# Patient Record
Sex: Female | Born: 1955
Health system: Southern US, Community
[De-identification: ages and names within clinical notes are randomized; demographics above are authoritative.]

## PROBLEM LIST (undated history)

## (undated) DIAGNOSIS — R002 Palpitations: Secondary | ICD-10-CM

## (undated) DIAGNOSIS — I499 Cardiac arrhythmia, unspecified: Secondary | ICD-10-CM

## (undated) DIAGNOSIS — R1310 Dysphagia, unspecified: Secondary | ICD-10-CM

## (undated) DIAGNOSIS — Z8719 Personal history of other diseases of the digestive system: Secondary | ICD-10-CM

## (undated) DIAGNOSIS — F329 Major depressive disorder, single episode, unspecified: Secondary | ICD-10-CM

## (undated) DIAGNOSIS — R079 Chest pain, unspecified: Secondary | ICD-10-CM

## (undated) DIAGNOSIS — S82899A Other fracture of unspecified lower leg, initial encounter for closed fracture: Secondary | ICD-10-CM

## (undated) DIAGNOSIS — I471 Supraventricular tachycardia, unspecified: Secondary | ICD-10-CM

## (undated) DIAGNOSIS — S42009A Fracture of unspecified part of unspecified clavicle, initial encounter for closed fracture: Secondary | ICD-10-CM

## (undated) DIAGNOSIS — R0602 Shortness of breath: Secondary | ICD-10-CM

## (undated) DIAGNOSIS — N840 Polyp of corpus uteri: Secondary | ICD-10-CM

## (undated) DIAGNOSIS — M25561 Pain in right knee: Secondary | ICD-10-CM

## (undated) DIAGNOSIS — J302 Other seasonal allergic rhinitis: Secondary | ICD-10-CM

## (undated) DIAGNOSIS — E785 Hyperlipidemia, unspecified: Secondary | ICD-10-CM

## (undated) DIAGNOSIS — M1990 Unspecified osteoarthritis, unspecified site: Secondary | ICD-10-CM

## (undated) DIAGNOSIS — K219 Gastro-esophageal reflux disease without esophagitis: Secondary | ICD-10-CM

## (undated) DIAGNOSIS — B019 Varicella without complication: Secondary | ICD-10-CM

## (undated) DIAGNOSIS — R5383 Other fatigue: Secondary | ICD-10-CM

## (undated) DIAGNOSIS — T8859XA Other complications of anesthesia, initial encounter: Secondary | ICD-10-CM

## (undated) DIAGNOSIS — F419 Anxiety disorder, unspecified: Secondary | ICD-10-CM

## (undated) DIAGNOSIS — J45909 Unspecified asthma, uncomplicated: Secondary | ICD-10-CM

## (undated) DIAGNOSIS — K589 Irritable bowel syndrome without diarrhea: Secondary | ICD-10-CM

## (undated) DIAGNOSIS — S92902A Unspecified fracture of left foot, initial encounter for closed fracture: Secondary | ICD-10-CM

## (undated) DIAGNOSIS — F32A Depression, unspecified: Secondary | ICD-10-CM

## (undated) DIAGNOSIS — M545 Low back pain, unspecified: Secondary | ICD-10-CM

## (undated) DIAGNOSIS — C801 Malignant (primary) neoplasm, unspecified: Secondary | ICD-10-CM

## (undated) DIAGNOSIS — F410 Panic disorder [episodic paroxysmal anxiety] without agoraphobia: Secondary | ICD-10-CM

## (undated) DIAGNOSIS — M25562 Pain in left knee: Secondary | ICD-10-CM

## (undated) DIAGNOSIS — M2550 Pain in unspecified joint: Secondary | ICD-10-CM

## (undated) HISTORY — DX: Panic disorder (episodic paroxysmal anxiety): F41.0

## (undated) HISTORY — DX: Hyperlipidemia, unspecified: E78.5

## (undated) HISTORY — PX: COLONOSCOPY: SHX174

## (undated) HISTORY — DX: Other seasonal allergic rhinitis: J30.2

## (undated) HISTORY — PX: ENDOMETRIAL ABLATION: SHX621

## (undated) HISTORY — PX: TUBAL LIGATION: SHX77

## (undated) HISTORY — DX: Pain in right knee: M25.562

## (undated) HISTORY — DX: Unspecified asthma, uncomplicated: J45.909

## (undated) HISTORY — DX: Fracture of unspecified part of unspecified clavicle, initial encounter for closed fracture: S42.009A

## (undated) HISTORY — PX: UPPER GASTROINTESTINAL ENDOSCOPY: SHX188

## (undated) HISTORY — PX: TONSILLECTOMY: SHX5217

## (undated) HISTORY — DX: Varicella without complication: B01.9

## (undated) HISTORY — DX: Shortness of breath: R06.02

## (undated) HISTORY — DX: Unspecified osteoarthritis, unspecified site: M19.90

## (undated) HISTORY — DX: Irritable bowel syndrome without diarrhea: K58.9

## (undated) HISTORY — PX: MANDIBLE SURGERY: SHX707

## (undated) HISTORY — DX: Low back pain, unspecified: M54.50

## (undated) HISTORY — DX: Other fracture of unspecified lower leg, initial encounter for closed fracture: S82.899A

## (undated) HISTORY — DX: Major depressive disorder, single episode, unspecified: F32.9

## (undated) HISTORY — DX: Supraventricular tachycardia, unspecified: I47.10

## (undated) HISTORY — DX: Depression, unspecified: F32.A

## (undated) HISTORY — PX: OTHER SURGICAL HISTORY: SHX169

## (undated) HISTORY — DX: Pain in unspecified joint: M25.50

## (undated) HISTORY — DX: Unspecified fracture of left foot, initial encounter for closed fracture: S92.902A

## (undated) HISTORY — DX: Other fatigue: R53.83

## (undated) HISTORY — DX: Palpitations: R00.2

## (undated) HISTORY — DX: Pain in right knee: M25.561

## (undated) HISTORY — DX: Dysphagia, unspecified: R13.10

## (undated) HISTORY — DX: Gastro-esophageal reflux disease without esophagitis: K21.9

## (undated) HISTORY — DX: Polyp of corpus uteri: N84.0

## (undated) HISTORY — DX: Chest pain, unspecified: R07.9

## (undated) HISTORY — DX: Anxiety disorder, unspecified: F41.9

---

## 2014-11-14 DIAGNOSIS — S8263XA Displaced fracture of lateral malleolus of unspecified fibula, initial encounter for closed fracture: Secondary | ICD-10-CM | POA: Insufficient documentation

## 2014-11-14 DIAGNOSIS — S92253A Displaced fracture of navicular [scaphoid] of unspecified foot, initial encounter for closed fracture: Secondary | ICD-10-CM | POA: Insufficient documentation

## 2015-01-04 DIAGNOSIS — M5136 Other intervertebral disc degeneration, lumbar region: Secondary | ICD-10-CM | POA: Insufficient documentation

## 2015-01-04 DIAGNOSIS — M4317 Spondylolisthesis, lumbosacral region: Secondary | ICD-10-CM | POA: Insufficient documentation

## 2015-06-25 ENCOUNTER — Encounter: Payer: Self-pay | Admitting: Family Medicine

## 2015-06-25 ENCOUNTER — Ambulatory Visit (INDEPENDENT_AMBULATORY_CARE_PROVIDER_SITE_OTHER): Payer: Federal, State, Local not specified - PPO | Admitting: Family Medicine

## 2015-06-25 VITALS — BP 126/80 | HR 84 | Temp 98.1°F | Ht 61.25 in | Wt 243.0 lb

## 2015-06-25 DIAGNOSIS — Z0001 Encounter for general adult medical examination with abnormal findings: Secondary | ICD-10-CM | POA: Diagnosis not present

## 2015-06-25 DIAGNOSIS — M170 Bilateral primary osteoarthritis of knee: Secondary | ICD-10-CM

## 2015-06-25 DIAGNOSIS — K219 Gastro-esophageal reflux disease without esophagitis: Secondary | ICD-10-CM

## 2015-06-25 DIAGNOSIS — E785 Hyperlipidemia, unspecified: Secondary | ICD-10-CM | POA: Diagnosis not present

## 2015-06-25 DIAGNOSIS — J0140 Acute pansinusitis, unspecified: Secondary | ICD-10-CM

## 2015-06-25 DIAGNOSIS — J209 Acute bronchitis, unspecified: Secondary | ICD-10-CM | POA: Diagnosis not present

## 2015-06-25 DIAGNOSIS — M25562 Pain in left knee: Secondary | ICD-10-CM | POA: Insufficient documentation

## 2015-06-25 DIAGNOSIS — S83412A Sprain of medial collateral ligament of left knee, initial encounter: Secondary | ICD-10-CM | POA: Insufficient documentation

## 2015-06-25 DIAGNOSIS — Z114 Encounter for screening for human immunodeficiency virus [HIV]: Secondary | ICD-10-CM

## 2015-06-25 DIAGNOSIS — R319 Hematuria, unspecified: Secondary | ICD-10-CM

## 2015-06-25 DIAGNOSIS — F419 Anxiety disorder, unspecified: Secondary | ICD-10-CM | POA: Insufficient documentation

## 2015-06-25 DIAGNOSIS — F411 Generalized anxiety disorder: Secondary | ICD-10-CM

## 2015-06-25 DIAGNOSIS — Z1231 Encounter for screening mammogram for malignant neoplasm of breast: Secondary | ICD-10-CM

## 2015-06-25 DIAGNOSIS — Z1159 Encounter for screening for other viral diseases: Secondary | ICD-10-CM

## 2015-06-25 LAB — POCT URINALYSIS DIPSTICK
BILIRUBIN UA: NEGATIVE
GLUCOSE UA: NEGATIVE
KETONES UA: NEGATIVE
Leukocytes, UA: NEGATIVE
NITRITE UA: NEGATIVE
Protein, UA: NEGATIVE
Spec Grav, UA: >=1.03
Urobilinogen, UA: 0.2
pH, UA: 6

## 2015-06-25 MED ORDER — ALBUTEROL SULFATE HFA 108 (90 BASE) MCG/ACT IN AERS: INHALATION_SPRAY | RESPIRATORY_TRACT | Status: DC

## 2015-06-25 MED ORDER — FLUTICASONE PROPIONATE 50 MCG/ACT NA SUSP: 2.0000 | Freq: Every day | NASAL | Status: DC

## 2015-06-25 MED ORDER — ALPRAZOLAM 0.25 MG PO TABS: 0.2500 mg | ORAL_TABLET | Freq: Every evening | ORAL | Status: DC | PRN

## 2015-06-25 MED ORDER — AMOXICILLIN-POT CLAVULANATE 875-125 MG PO TABS: 1.0000 | ORAL_TABLET | Freq: Two times a day (BID) | ORAL | Status: DC

## 2015-06-25 MED ORDER — PHENTERMINE HCL 37.5 MG PO TABS: 37.5000 mg | ORAL_TABLET | Freq: Every day | ORAL | Status: DC

## 2015-06-25 NOTE — Progress Notes (Signed)
Pre visit review using our clinic review tool, if applicable. No additional management support is needed unless otherwise documented below in the visit note. 

## 2015-06-25 NOTE — Progress Notes (Addendum)
Subjective:     Tina Hinton is a 60 y.o. female and is here for a comprehensive physical exam. The patient reports problems - cough,-- productive x 2 weeks , sinus headache.  Social History   Social History  . Marital Status: Divorced    Spouse Name: N/A  . Number of Children: N/A  . Years of Education: N/A   Occupational History  . Not on file.   Social History Main Topics  . Smoking status: Never Smoker   . Smokeless tobacco: Never Used  . Alcohol Use: No  . Drug Use: No  . Sexual Activity: Not on file   Other Topics Concern  . Not on file   Social History Narrative  . No narrative on file   Health Maintenance  Topic Date Due  . TETANUS/TDAP  06/21/1974  . PAP SMEAR  06/21/1976  . MAMMOGRAM  06/21/2005  . COLONOSCOPY  06/21/2005  . ZOSTAVAX  06/22/2015  . INFLUENZA VACCINE  01/08/2016  . Hepatitis C Screening  Completed  . HIV Screening  Completed    The following portions of the patient's history were reviewed and updated as appropriate:  She  has a past medical history of Asthma; Arthritis; Chicken pox; Depression; GERD (gastroesophageal reflux disease); Hyperlipidemia; Clavicle fracture; Ankle fracture; and Foot fracture, left. She  does not have any pertinent problems on file. She  has past surgical history that includes Tonsillectomy; Mandible surgery; Cesarean section; Tubal ligation; and Endometrial ablation. Her family history includes Arthritis in her maternal grandmother and mother; Breast cancer in her maternal aunt and paternal grandmother; Breast cancer (age of onset: 89) in her mother; Diabetes in her maternal grandmother; Heart disease in her maternal grandmother; Prostate cancer in her paternal grandfather. She  reports that she has never smoked. She has never used smokeless tobacco. She reports that she does not drink alcohol or use illicit drugs. She has a current medication list which includes the following prescription(s): albuterol,  alprazolam, amoxicillin-clavulanate, diclofenac, fluticasone, lansoprazole, methocarbamol, phentermine, and rosuvastatin. No current outpatient prescriptions on file prior to visit.   No current facility-administered medications on file prior to visit.   She is allergic to phenylephrine-guaifenesin..  Review of Systems Review of Systems  Constitutional: Negative for activity change, appetite change and fatigue.  HENT: Negative for hearing loss, congestion, tinnitus and ear discharge.  dentist q35m + SINUS HEADACHE Eyes: Negative for visual disturbance (see optho q1y -- vision corrected to 20/20 with glasses).  Respiratory: Negative for , chest tightness and shortness of breath.  + cough Cardiovascular: Negative for chest pain, palpitations and leg swelling.  Gastrointestinal: Negative for abdominal pain, diarrhea, constipation and abdominal distention.  Genitourinary: Negative for urgency, frequency, decreased urine volume and difficulty urinating.  Musculoskeletal: Negative for back pain, arthralgias and gait problem.  Skin: Negative for color change, pallor and rash.  Neurological: Negative for dizziness, light-headedness, numbness  Hematological: Negative for adenopathy. Does not bruise/bleed easily.  Psychiatric/Behavioral: Negative for suicidal ideas, confusion, sleep disturbance, self-injury, dysphoric mood, decreased concentration and agitation.       Objective:    BP 126/80 mmHg  Pulse 84  Temp(Src) 98.1 F (36.7 C) (Oral)  Ht 5' 1.25" (1.556 m)  Wt 243 lb (110.224 kg)  BMI 45.53 kg/m2  SpO2 98% General appearance: alert, cooperative, appears stated age and no distress Head: Normocephalic, without obvious abnormality, atraumatic Eyes: conjunctivae/corneas clear. PERRL, EOM's intact. Fundi benign. Ears: normal TM's and external ear canals both ears Nose: green discharge, moderate  congestion, turbinates red, swollen, sinus tenderness bilateral Throat: lips, mucosa, and  tongue normal; teeth and gums normal Neck: mild anterior cervical adenopathy, supple, symmetrical, trachea midline and thyroid not enlarged, symmetric, no tenderness/mass/nodules Back: symmetric, no curvature. ROM normal. No CVA tenderness. Lungs: clear to auscultation bilaterally Breasts: normal appearance, no masses or tenderness Heart: regular rate and rhythm, S1, S2 normal, no murmur, click, rub or gallop Abdomen: soft, non-tender; bowel sounds normal; no masses,  no organomegaly Pelvic: not indicated; post-menopausal, no abnormal Pap smears in past Extremities: extremities normal, atraumatic, no cyanosis or edema Pulses: 2+ and symmetric Skin: Skin color, texture, turgor normal. No rashes or lesions Lymph nodes: Cervical, supraclavicular, and axillary nodes normal. Neurologic: Alert and oriented X 3, normal strength and tone. Normal symmetric reflexes. Normal coordination and gait Psych-- no depression, no anxiety      Assessment:    Healthy female exam.     Plan:     See After Visit Summary for Counseling Recommendations   Check labs con't med  1. Visit for screening mammogram   - MM DIGITAL SCREENING BILATERAL; Future  2. Osteoarthritis of both knees, unspecified osteoarthritis type    3. Hyperlipidemia LDL goal <100   Current outpatient prescriptions:  .  albuterol (PROVENTIL HFA;VENTOLIN HFA) 108 (90 Base) MCG/ACT inhaler, 2 puffs qid prn, Disp: 1 Inhaler, Rfl: 2 .  ALPRAZolam (XANAX) 0.25 MG tablet, Take 1 tablet (0.25 mg total) by mouth at bedtime as needed., Disp: 30 tablet, Rfl: 1 .  amoxicillin-clavulanate (AUGMENTIN) 875-125 MG tablet, Take 1 tablet by mouth 2 (two) times daily., Disp: 20 tablet, Rfl: 0 .  diclofenac (VOLTAREN) 75 MG EC tablet, Take 75 mg by mouth 2 (two) times daily., Disp: , Rfl:  .  fluticasone (FLONASE) 50 MCG/ACT nasal spray, Place 2 sprays into both nostrils daily., Disp: 16 g, Rfl: 6 .  lansoprazole (PREVACID) 30 MG capsule, Take 30  mg by mouth daily at 12 noon., Disp: , Rfl:  .  methocarbamol (ROBAXIN) 750 MG tablet, Take 750 mg by mouth 2 (two) times daily as needed for muscle spasms., Disp: , Rfl:  .  phentermine (ADIPEX-P) 37.5 MG tablet, Take 1 tablet (37.5 mg total) by mouth daily before breakfast., Disp: 30 tablet, Rfl: 0 .  rosuvastatin (CRESTOR) 20 MG tablet, Take 20 mg by mouth at bedtime., Disp: , Rfl:  - Comp Met (CMET) - CBC with Differential/Platelet - Lipid panel - POCT urinalysis dipstick - TSH  4. Morbid obesity, unspecified obesity type ([place])   - EKG 12-Lead - phentermine (ADIPEX-P) 37.5 MG tablet; Take 1 tablet (37.5 mg total) by mouth daily before breakfast.  Dispense: 30 tablet; Refill: 0  5. Generalized anxiety disorder   - ALPRAZolam (XANAX) 0.25 MG tablet; Take 1 tablet (0.25 mg total) by mouth at bedtime as needed.  Dispense: 30 tablet; Refill: 1  6. Acute bronchitis, unspecified organism  rto if symptoms worsen or do not improve - amoxicillin-clavulanate (AUGMENTIN) 875-125 MG tablet; Take 1 tablet by mouth 2 (two) times daily.  Dispense: 20 tablet; Refill: 0 - albuterol (PROVENTIL HFA;VENTOLIN HFA) 108 (90 Base) MCG/ACT inhaler; 2 puffs qid prn  Dispense: 1 Inhaler; Refill: 2  7. Acute pansinusitis, recurrence not specified abx per orders  - fluticasone (FLONASE) 50 MCG/ACT nasal spray; Place 2 sprays into both nostrils daily.  Dispense: 16 g; Refill: 6  8. Need for hepatitis C screening test   - Hepatitis C antibody  9. Encounter for screening for HIV   -  HIV antibody  10. Hematuria   - Urine culture

## 2015-06-25 NOTE — Patient Instructions (Signed)

## 2015-06-26 LAB — CBC WITH DIFFERENTIAL/PLATELET
BASOS ABS: 0.1 10*3/uL (ref 0.0–0.1)
Basophils Relative: 0.8 % (ref 0.0–3.0)
EOS ABS: 0.2 10*3/uL (ref 0.0–0.7)
Eosinophils Relative: 2.1 % (ref 0.0–5.0)
HCT: 39.4 % (ref 36.0–46.0)
Hemoglobin: 12.7 g/dL (ref 12.0–15.0)
LYMPHS ABS: 2.1 10*3/uL (ref 0.7–4.0)
Lymphocytes Relative: 26 % (ref 12.0–46.0)
MCHC: 32.3 g/dL (ref 30.0–36.0)
MCV: 88.8 fl (ref 78.0–100.0)
Monocytes Absolute: 0.8 10*3/uL (ref 0.1–1.0)
Monocytes Relative: 9.2 % (ref 3.0–12.0)
NEUTROS ABS: 5.1 10*3/uL (ref 1.4–7.7)
NEUTROS PCT: 61.9 % (ref 43.0–77.0)
PLATELETS: 272 10*3/uL (ref 150.0–400.0)
RBC: 4.44 Mil/uL (ref 3.87–5.11)
RDW: 14 % (ref 11.5–15.5)
WBC: 8.2 10*3/uL (ref 4.0–10.5)

## 2015-06-26 LAB — COMPREHENSIVE METABOLIC PANEL
ALK PHOS: 87 U/L (ref 39–117)
ALT: 17 U/L (ref 0–35)
AST: 21 U/L (ref 0–37)
Albumin: 3.9 g/dL (ref 3.5–5.2)
BILIRUBIN TOTAL: 0.2 mg/dL (ref 0.2–1.2)
BUN: 17 mg/dL (ref 6–23)
CALCIUM: 8.8 mg/dL (ref 8.4–10.5)
CO2: 32 meq/L (ref 19–32)
CREATININE: 0.76 mg/dL (ref 0.40–1.20)
Chloride: 102 mEq/L (ref 96–112)
GFR: 82.5 mL/min (ref >60.00)
GLUCOSE: 84 mg/dL (ref 70–99)
Potassium: 4.1 mEq/L (ref 3.5–5.1)
Sodium: 141 mEq/L (ref 135–145)
TOTAL PROTEIN: 7.2 g/dL (ref 6.0–8.3)

## 2015-06-26 LAB — HIV ANTIBODY (ROUTINE TESTING W REFLEX): HIV 1&2 Ab, 4th Generation: NONREACTIVE

## 2015-06-26 LAB — LIPID PANEL
CHOLESTEROL: 232 mg/dL — AB (ref 0–200)
HDL: 38.5 mg/dL — AB (ref >39.00)
NonHDL: 193.47
TRIGLYCERIDES: 216 mg/dL — AB (ref 0.0–149.0)
Total CHOL/HDL Ratio: 6
VLDL: 43.2 mg/dL — ABNORMAL HIGH (ref 0.0–40.0)

## 2015-06-26 LAB — HEPATITIS C ANTIBODY: HCV Ab: NEGATIVE

## 2015-06-26 LAB — TSH: TSH: 1.1 u[IU]/mL (ref 0.35–4.50)

## 2015-06-26 LAB — LDL CHOLESTEROL, DIRECT: LDL DIRECT: 155 mg/dL

## 2015-06-27 LAB — URINE CULTURE

## 2015-07-02 ENCOUNTER — Ambulatory Visit (HOSPITAL_BASED_OUTPATIENT_CLINIC_OR_DEPARTMENT_OTHER)
Admission: RE | Admit: 2015-07-02 | Discharge: 2015-07-02 | Disposition: A | Discharge status: Home / Self Care | Payer: Federal, State, Local not specified - PPO | Source: Ambulatory Visit | Attending: Family Medicine | Admitting: Family Medicine

## 2015-07-02 DIAGNOSIS — Z1231 Encounter for screening mammogram for malignant neoplasm of breast: Secondary | ICD-10-CM

## 2015-07-04 ENCOUNTER — Other Ambulatory Visit: Payer: Self-pay

## 2015-07-04 DIAGNOSIS — E785 Hyperlipidemia, unspecified: Secondary | ICD-10-CM

## 2015-07-04 MED ORDER — ROSUVASTATIN CALCIUM 20 MG PO TABS: 20.0000 mg | ORAL_TABLET | Freq: Every day | ORAL | Status: DC

## 2015-07-04 MED ORDER — FENOFIBRATE 160 MG PO TABS: 160.0000 mg | ORAL_TABLET | Freq: Every day | ORAL | Status: DC

## 2015-07-30 ENCOUNTER — Ambulatory Visit (INDEPENDENT_AMBULATORY_CARE_PROVIDER_SITE_OTHER): Payer: Federal, State, Local not specified - PPO | Admitting: Family Medicine

## 2015-07-30 ENCOUNTER — Encounter: Payer: Self-pay | Admitting: Family Medicine

## 2015-07-30 VITALS — BP 114/80 | HR 90 | Temp 97.8°F | Ht 61.0 in | Wt 230.0 lb

## 2015-07-30 DIAGNOSIS — Z0000 Encounter for general adult medical examination without abnormal findings: Secondary | ICD-10-CM

## 2015-07-30 DIAGNOSIS — M170 Bilateral primary osteoarthritis of knee: Secondary | ICD-10-CM

## 2015-07-30 DIAGNOSIS — E669 Obesity, unspecified: Secondary | ICD-10-CM | POA: Insufficient documentation

## 2015-07-30 DIAGNOSIS — Z23 Encounter for immunization: Secondary | ICD-10-CM

## 2015-07-30 MED ORDER — METHOCARBAMOL 750 MG PO TABS: 750.0000 mg | ORAL_TABLET | Freq: Two times a day (BID) | ORAL | Status: DC | PRN

## 2015-07-30 MED ORDER — DICLOFENAC SODIUM 75 MG PO TBEC: 75.0000 mg | DELAYED_RELEASE_TABLET | Freq: Two times a day (BID) | ORAL | Status: DC

## 2015-07-30 MED ORDER — PHENTERMINE HCL 37.5 MG PO TABS: 37.5000 mg | ORAL_TABLET | Freq: Every day | ORAL | Status: DC

## 2015-07-30 NOTE — Progress Notes (Signed)
Pre visit review using our clinic review tool, if applicable. No additional management support is needed unless otherwise documented below in the visit note. 

## 2015-07-30 NOTE — Patient Instructions (Signed)

## 2015-07-30 NOTE — Assessment & Plan Note (Signed)
con't with diet Pt to start exercising Refill phenteramine  rto 1 month

## 2015-07-30 NOTE — Assessment & Plan Note (Signed)
Pt due for colonoscopy zostavax given

## 2015-07-30 NOTE — Progress Notes (Signed)
Patient ID: Tina Hinton, female    DOB: 05-26-1956  Age: 60 y.o. MRN: [id number]    Subjective:  Subjective HPI Tina Hinton presents for weight check.  She has been cutting down on carbs and eating more fruit and veg.  She has lost 13 lbs and is happy with that.  Pt is not exercising yet but plans to start.   No more wheezing.    Review of Systems  Constitutional: Negative for diaphoresis, appetite change, fatigue and unexpected weight change.  Eyes: Negative for pain, redness and visual disturbance.  Respiratory: Negative for cough, chest tightness, shortness of breath and wheezing.   Cardiovascular: Negative for chest pain, palpitations and leg swelling.  Endocrine: Negative for cold intolerance, heat intolerance, polydipsia, polyphagia and polyuria.  Genitourinary: Negative for dysuria, frequency and difficulty urinating.  Neurological: Negative for dizziness, light-headedness, numbness and headaches.  Psychiatric/Behavioral: Negative for decreased concentration. The patient is not nervous/anxious.     History Past Medical History  Diagnosis Date  . Asthma   . Arthritis   . Chicken pox   . Depression   . GERD (gastroesophageal reflux disease)   . Hyperlipidemia   . Clavicle fracture     Left  . Ankle fracture     Right  . Foot fracture, left     She has past surgical history that includes Tonsillectomy; Mandible surgery; Cesarean section; Tubal ligation; and Endometrial ablation.   Her family history includes Arthritis in her maternal grandmother and mother; Breast cancer in her maternal aunt and paternal grandmother; Breast cancer (age of onset: 2) in her mother; Diabetes in her maternal grandmother; Heart disease in her maternal grandmother; Prostate cancer in her paternal grandfather.She reports that she has never smoked. She has never used smokeless tobacco. She reports that she does not drink alcohol or use illicit drugs.  Current Outpatient  Prescriptions on File Prior to Visit  Medication Sig Dispense Refill  . albuterol (PROVENTIL HFA;VENTOLIN HFA) 108 (90 Base) MCG/ACT inhaler 2 puffs qid prn 1 Inhaler 2  . ALPRAZolam (XANAX) 0.25 MG tablet Take 1 tablet (0.25 mg total) by mouth at bedtime as needed. 30 tablet 1  . fenofibrate 160 MG tablet Take 1 tablet (160 mg total) by mouth daily. 30 tablet 5  . lansoprazole (PREVACID) 30 MG capsule Take 30 mg by mouth daily at 12 noon.    . rosuvastatin (CRESTOR) 20 MG tablet Take 1 tablet (20 mg total) by mouth at bedtime. 30 tablet 5   No current facility-administered medications on file prior to visit.     Objective:  Objective Physical Exam  Constitutional: She is oriented to person, place, and time. She appears well-developed and well-nourished.  HENT:  Head: Normocephalic and atraumatic.  Eyes: Conjunctivae and EOM are normal.  Neck: Normal range of motion. Neck supple. No JVD present. Carotid bruit is not present. No thyromegaly present.  Cardiovascular: Normal rate, regular rhythm and normal heart sounds.   No murmur heard. Pulmonary/Chest: Effort normal and breath sounds normal. No respiratory distress. She has no wheezes. She has no rales. She exhibits no tenderness.  Musculoskeletal: She exhibits no edema.  Neurological: She is alert and oriented to person, place, and time.  Psychiatric: She has a normal mood and affect. Her behavior is normal. Thought content normal.  Nursing note and vitals reviewed.  BP 114/80 mmHg  Pulse 90  Temp(Src) 97.8 F (36.6 C) (Oral)  Ht 5\' 1"  (1.549 m)  Wt 230 lb (104.327 kg)  BMI 43.48  kg/m2  SpO2 97% Wt Readings from Last 3 Encounters:  07/30/15 230 lb (104.327 kg)  06/25/15 243 lb (110.224 kg)     Lab Results  Component Value Date   WBC 8.2 06/25/2015   HGB 12.7 06/25/2015   HCT 39.4 06/25/2015   PLT 272.0 06/25/2015   GLUCOSE 84 06/25/2015   CHOL 232* 06/25/2015   TRIG 216.0* 06/25/2015   HDL 38.50* 06/25/2015    LDLDIRECT 155.0 06/25/2015   ALT 17 06/25/2015   AST 21 06/25/2015   NA 141 06/25/2015   K 4.1 06/25/2015   CL 102 06/25/2015   CREATININE 0.76 06/25/2015   BUN 17 06/25/2015   CO2 32 06/25/2015   TSH 1.10 06/25/2015    Mm Digital Screening Bilateral  07/03/2015  CLINICAL DATA:  Screening. EXAM: DIGITAL SCREENING BILATERAL MAMMOGRAM WITH CAD COMPARISON:  None. ACR Breast Density Category b: There are scattered areas of fibroglandular density. FINDINGS: There are no findings suspicious for malignancy. Images were processed with CAD. IMPRESSION: No mammographic evidence of malignancy. A result letter of this screening mammogram will be mailed directly to the patient. RECOMMENDATION: Screening mammogram in one year. (Code:SM-B-01Y) BI-RADS CATEGORY  1: Negative. Electronically Signed   By: Curlene Dolphin M.D.   On: 07/03/2015 09:27     Assessment & Plan:  Plan I have discontinued Ms. Mousel's amoxicillin-clavulanate and fluticasone. I have also changed her diclofenac and methocarbamol. Additionally, I am having her maintain her lansoprazole, ALPRAZolam, albuterol, fenofibrate, rosuvastatin, and phentermine.  Meds ordered this encounter  Medications  . diclofenac (VOLTAREN) 75 MG EC tablet    Sig: Take 1 tablet (75 mg total) by mouth 2 (two) times daily.    Dispense:  60 tablet    Refill:  2  . methocarbamol (ROBAXIN) 750 MG tablet    Sig: Take 1 tablet (750 mg total) by mouth 2 (two) times daily as needed for muscle spasms.    Dispense:  60 tablet    Refill:  0  . phentermine (ADIPEX-P) 37.5 MG tablet    Sig: Take 1 tablet (37.5 mg total) by mouth daily before breakfast.    Dispense:  30 tablet    Refill:  0    Problem List Items Addressed This Visit      Unprioritized   Preventative health care - Primary   Relevant Orders   Ambulatory referral to Gastroenterology   Osteoarthritis of both knees   Relevant Medications   diclofenac (VOLTAREN) 75 MG EC tablet    methocarbamol (ROBAXIN) 750 MG tablet    Other Visit Diagnoses    Morbid obesity, unspecified obesity type ([place])        Relevant Medications    phentermine (ADIPEX-P) 37.5 MG tablet    Need for shingles vaccine        Relevant Orders    Varicella-zoster vaccine subcutaneous (Completed)       Follow-up: Return in about 4 weeks (around 08/27/2015), or if symptoms worsen or fail to improve, for weight check, annual exam, fasting.  Garnet Koyanagi, DO

## 2015-07-31 ENCOUNTER — Encounter: Payer: Self-pay | Admitting: Internal Medicine

## 2015-08-27 ENCOUNTER — Encounter: Payer: Self-pay | Admitting: Family Medicine

## 2015-08-27 ENCOUNTER — Ambulatory Visit (INDEPENDENT_AMBULATORY_CARE_PROVIDER_SITE_OTHER): Payer: Federal, State, Local not specified - PPO | Admitting: Family Medicine

## 2015-08-27 DIAGNOSIS — L309 Dermatitis, unspecified: Secondary | ICD-10-CM | POA: Diagnosis not present

## 2015-08-27 DIAGNOSIS — D229 Melanocytic nevi, unspecified: Secondary | ICD-10-CM

## 2015-08-27 MED ORDER — PHENTERMINE HCL 37.5 MG PO TABS: 37.5000 mg | ORAL_TABLET | Freq: Every day | ORAL | Status: DC

## 2015-08-27 MED ORDER — TRIAMCINOLONE 0.1 % CREAM:EUCERIN CREAM 1:1: 1.0000 "application " | TOPICAL_CREAM | Freq: Three times a day (TID) | CUTANEOUS | Status: DC | PRN

## 2015-08-27 NOTE — Progress Notes (Signed)
Patient ID: Tina Hinton, female    DOB: [birth year]  Age: 60 y.o. MRN: [id number]    Subjective:  Subjective HPI Tina Hinton presents for weight check.  Pt knows she did not do well this month.  She is requesting another try.    Review of Systems  Constitutional: Negative for diaphoresis, appetite change, fatigue and unexpected weight change.  Eyes: Negative for pain, redness and visual disturbance.  Respiratory: Negative for cough, chest tightness, shortness of breath and wheezing.   Cardiovascular: Negative for chest pain, palpitations and leg swelling.  Endocrine: Negative for cold intolerance, heat intolerance, polydipsia, polyphagia and polyuria.  Genitourinary: Negative for dysuria, frequency and difficulty urinating.  Neurological: Negative for dizziness, light-headedness, numbness and headaches.    History Past Medical History  Diagnosis Date  . Asthma   . Arthritis   . Chicken pox   . Depression   . GERD (gastroesophageal reflux disease)   . Hyperlipidemia   . Clavicle fracture     Left  . Ankle fracture     Right  . Foot fracture, left     She has past surgical history that includes Tonsillectomy; Mandible surgery; Cesarean section; Tubal ligation; and Endometrial ablation.   Her family history includes Arthritis in her maternal grandmother and mother; Breast cancer in her maternal aunt and paternal grandmother; Breast cancer (age of onset: 73) in her mother; Diabetes in her maternal grandmother; Heart disease in her maternal grandmother; Prostate cancer in her paternal grandfather.She reports that she has never smoked. She has never used smokeless tobacco. She reports that she does not drink alcohol or use illicit drugs.  Current Outpatient Prescriptions on File Prior to Visit  Medication Sig Dispense Refill  . albuterol (PROVENTIL HFA;VENTOLIN HFA) 108 (90 Base) MCG/ACT inhaler 2 puffs qid prn 1 Inhaler 2  . ALPRAZolam (XANAX) 0.25 MG tablet Take 1  tablet (0.25 mg total) by mouth at bedtime as needed. 30 tablet 1  . diclofenac (VOLTAREN) 75 MG EC tablet Take 1 tablet (75 mg total) by mouth 2 (two) times daily. 60 tablet 2  . fenofibrate 160 MG tablet Take 1 tablet (160 mg total) by mouth daily. 30 tablet 5  . lansoprazole (PREVACID) 30 MG capsule Take 30 mg by mouth daily at 12 noon.    . methocarbamol (ROBAXIN) 750 MG tablet Take 1 tablet (750 mg total) by mouth 2 (two) times daily as needed for muscle spasms. 60 tablet 0  . rosuvastatin (CRESTOR) 20 MG tablet Take 1 tablet (20 mg total) by mouth at bedtime. 30 tablet 5   No current facility-administered medications on file prior to visit.     Objective:  Objective Physical Exam  Constitutional: She is oriented to person, place, and time. She appears well-developed and well-nourished.  HENT:  Head: Normocephalic and atraumatic.  Eyes: Conjunctivae and EOM are normal.  Neck: Normal range of motion. Neck supple. No JVD present. Carotid bruit is not present. No thyromegaly present.  Cardiovascular: Normal rate, regular rhythm and normal heart sounds.   No murmur heard. Pulmonary/Chest: Effort normal and breath sounds normal. No respiratory distress. She has no wheezes. She has no rales. She exhibits no tenderness.  Musculoskeletal: She exhibits no edema.  Neurological: She is alert and oriented to person, place, and time.  Psychiatric: She has a normal mood and affect. Her behavior is normal. Judgment and thought content normal.  Nursing note and vitals reviewed.  BP 120/84 mmHg  Pulse 85  Temp(Src) 97.7 F (36.5 C) (  Oral)  Ht 5\' 1"  (1.549 m)  Wt 228 lb 12.8 oz (103.783 kg)  BMI 43.25 kg/m2  SpO2 97% Wt Readings from Last 3 Encounters:  08/27/15 228 lb 12.8 oz (103.783 kg)  07/30/15 230 lb (104.327 kg)  06/25/15 243 lb (110.224 kg)     Lab Results  Component Value Date   WBC 8.2 06/25/2015   HGB 12.7 06/25/2015   HCT 39.4 06/25/2015   PLT 272.0 06/25/2015   GLUCOSE  84 06/25/2015   CHOL 232* 06/25/2015   TRIG 216.0* 06/25/2015   HDL 38.50* 06/25/2015   LDLDIRECT 155.0 06/25/2015   ALT 17 06/25/2015   AST 21 06/25/2015   NA 141 06/25/2015   K 4.1 06/25/2015   CL 102 06/25/2015   CREATININE 0.76 06/25/2015   BUN 17 06/25/2015   CO2 32 06/25/2015   TSH 1.10 06/25/2015    Mm Digital Screening Bilateral  07/03/2015  CLINICAL DATA:  Screening. EXAM: DIGITAL SCREENING BILATERAL MAMMOGRAM WITH CAD COMPARISON:  None. ACR Breast Density Category b: There are scattered areas of fibroglandular density. FINDINGS: There are no findings suspicious for malignancy. Images were processed with CAD. IMPRESSION: No mammographic evidence of malignancy. A result letter of this screening mammogram will be mailed directly to the patient. RECOMMENDATION: Screening mammogram in one year. (Code:SM-B-01Y) BI-RADS CATEGORY  1: Negative. Electronically Signed   By: Curlene Dolphin M.D.   On: 07/03/2015 09:27     Assessment & Plan:  Plan I am having [name] start on triamcinolone 0.1 % cream : eucerin. I am also having her maintain her lansoprazole, ALPRAZolam, albuterol, fenofibrate, rosuvastatin, diclofenac, methocarbamol, and phentermine.  Meds ordered this encounter  Medications  . phentermine (ADIPEX-P) 37.5 MG tablet    Sig: Take 1 tablet (37.5 mg total) by mouth daily before breakfast.    Dispense:  30 tablet    Refill:  0  . Triamcinolone Acetonide (TRIAMCINOLONE 0.1 % CREAM : EUCERIN) CREA    Sig: Apply 1 application topically 3 (three) times daily as needed for rash or itching.    Dispense:  1 each    Refill:  3    Problem List Items Addressed This Visit      Unprioritized   Morbid obesity ([place]) - Primary   Relevant Medications   phentermine (ADIPEX-P) 37.5 MG tablet    Other Visit Diagnoses    Suspicious nevus        Relevant Orders    Ambulatory referral to Dermatology    Eczema        Relevant Medications    Triamcinolone Acetonide  (TRIAMCINOLONE 0.1 % CREAM : EUCERIN) CREA       Follow-up: Return in about 4 weeks (around 09/24/2015), or if symptoms worsen or fail to improve, for weight check.  Garnet Koyanagi, DO

## 2015-08-27 NOTE — Patient Instructions (Signed)
Obesity Obesity is defined as having too much total body fat and a body mass index (BMI) of 30 or more. BMI is an estimate of body fat and is calculated from your height and weight. BMI is typically calculated by your health care provider during regular wellness visits. Obesity happens when you consume more calories than you can burn by exercising or performing daily physical tasks. Prolonged obesity can cause major illnesses or emergencies, such as:  Stroke.  Heart disease.  Diabetes.  Cancer.  Arthritis.  High blood pressure (hypertension).  High cholesterol.  Sleep apnea.  Erectile dysfunction.  Infertility problems. CAUSES   Regularly eating unhealthy foods.  Physical inactivity.  Certain disorders, such as an underactive thyroid (hypothyroidism), Cushing's syndrome, and polycystic ovarian syndrome.  Certain medicines, such as steroids, some depression medicines, and antipsychotics.  Genetics.  Lack of sleep. DIAGNOSIS A health care provider can diagnose obesity after calculating your BMI. Obesity will be diagnosed if your BMI is 30 or higher. There are other methods of measuring obesity levels. Some other methods include measuring your skinfold thickness, your waist circumference, and comparing your hip circumference to your waist circumference. TREATMENT  A healthy treatment program includes some or all of the following:  Long-term dietary changes.  Exercise and physical activity.  Behavioral and lifestyle changes.  Medicine only under the supervision of your health care provider. Medicines may help, but only if they are used with diet and exercise programs. If your BMI is 40 or higher, your health care provider may recommend specialized surgery or programs to help with weight loss. An unhealthy treatment program includes:  Fasting.  Fad diets.  Supplements and drugs. These choices do not succeed in long-term weight control. HOME CARE  INSTRUCTIONS  Exercise and perform physical activity as directed by your health care provider. To increase physical activity, try the following:  Use stairs instead of elevators.  Park farther away from store entrances.  Garden, bike, or walk instead of watching television or using the computer.  Eat healthy, low-calorie foods and drinks on a regular basis. Eat more fruits and vegetables. Use low-calorie cookbooks or take healthy cooking classes.  Limit fast food, sweets, and processed snack foods.  Eat smaller portions.  Keep a daily journal of everything you eat. There are many free websites to help you with this. It may be helpful to measure your foods so you can determine if you are eating the correct portion sizes.  Avoid drinking alcohol. Drink more water and drinks without calories.  Take vitamins and supplements only as recommended by your health care provider.  Weight-loss support groups, registered dietitians, counselors, and stress reduction education can also be very helpful. SEEK IMMEDIATE MEDICAL CARE IF:  You have chest pain or tightness.  You have trouble breathing or feel short of breath.  You have weakness or leg numbness.  You feel confused or have trouble talking.  You have sudden changes in your vision.   This information is not intended to replace advice given to you by your health care provider. Make sure you discuss any questions you have with your health care provider.   Document Released: 07/03/2004 Document Revised: 06/16/2014 Document Reviewed: 07/02/2011 Elsevier Interactive Patient Education 2016 [employer]  

## 2015-08-27 NOTE — Progress Notes (Signed)
Pre visit review using our clinic review tool, if applicable. No additional management support is needed unless otherwise documented below in the visit note. 

## 2015-08-29 NOTE — Assessment & Plan Note (Signed)
Pt will work harder on diet and exercise rto 1 month

## 2015-08-31 ENCOUNTER — Ambulatory Visit (AMBULATORY_SURGERY_CENTER): Payer: Self-pay | Admitting: *Deleted

## 2015-08-31 VITALS — Ht 61.5 in | Wt 228.0 lb

## 2015-08-31 DIAGNOSIS — Z1211 Encounter for screening for malignant neoplasm of colon: Secondary | ICD-10-CM

## 2015-08-31 NOTE — Progress Notes (Signed)
No egg or soy allergy. No anesthesia problems.  No home O2.  Takes Phentermine. Told to hold 10 days.

## 2015-09-14 ENCOUNTER — Ambulatory Visit (AMBULATORY_SURGERY_CENTER): Payer: Federal, State, Local not specified - PPO | Admitting: Internal Medicine

## 2015-09-14 ENCOUNTER — Encounter: Payer: Self-pay | Admitting: Internal Medicine

## 2015-09-14 VITALS — BP 118/85 | HR 78 | Temp 96.6°F | Resp 10 | Ht 61.5 in | Wt 228.0 lb

## 2015-09-14 DIAGNOSIS — K639 Disease of intestine, unspecified: Secondary | ICD-10-CM

## 2015-09-14 DIAGNOSIS — K623 Rectal prolapse: Secondary | ICD-10-CM | POA: Diagnosis not present

## 2015-09-14 DIAGNOSIS — K5730 Diverticulosis of large intestine without perforation or abscess without bleeding: Secondary | ICD-10-CM | POA: Diagnosis not present

## 2015-09-14 DIAGNOSIS — Z1211 Encounter for screening for malignant neoplasm of colon: Secondary | ICD-10-CM | POA: Diagnosis not present

## 2015-09-14 MED ORDER — SODIUM CHLORIDE 0.9 % IV SOLN: 500.0000 mL | INTRAVENOUS | Status: DC

## 2015-09-14 NOTE — Patient Instructions (Addendum)
No polyps for sure but one area ? Was a polyp so I took biopsies to be sure. Probably not a polyp. You also have a condition called diverticulosis - common and not usually a problem. Please read the handout provided.  I will let you know pathology results and when to have another routine colonoscopy by mail.  I appreciate the opportunity to care for you.  Gatha Mayer, MD, FACG  YOU HAD AN ENDOSCOPIC PROCEDURE TODAY AT [facility]:   Refer to the procedure report that was given to you for any specific questions about what was found during the examination.  If the procedure report does not answer your questions, please call your gastroenterologist to clarify.  If you requested that your care partner not be given the details of your procedure findings, then the procedure report has been included in a sealed envelope for you to review at your convenience later.  YOU SHOULD EXPECT: Some feelings of bloating in the abdomen. Passage of more gas than usual.  Walking can help get rid of the air that was put into your GI tract during the procedure and reduce the bloating. If you had a lower endoscopy (such as a colonoscopy or flexible sigmoidoscopy) you may notice spotting of blood in your stool or on the toilet paper. If you underwent a bowel prep for your procedure, you may not have a normal bowel movement for a few days.  Please Note:  You might notice some irritation and congestion in your nose or some drainage.  This is from the oxygen used during your procedure.  There is no need for concern and it should clear up in a day or so.  SYMPTOMS TO REPORT IMMEDIATELY:   Following lower endoscopy (colonoscopy or flexible sigmoidoscopy):  Excessive amounts of blood in the stool  Significant tenderness or worsening of abdominal pains  Swelling of the abdomen that is new, acute  Fever of 100F or higher   For urgent or emergent issues, a gastroenterologist can be reached at  any hour by calling [phone].   DIET: Your first meal following the procedure should be a small meal and then it is ok to progress to your normal diet. Heavy or fried foods are harder to digest and may make you feel nauseous or bloated.  Likewise, meals heavy in dairy and vegetables can increase bloating.  Drink plenty of fluids but you should avoid alcoholic beverages for 24 hours.  ACTIVITY:  You should plan to take it easy for the rest of today and you should NOT DRIVE or use heavy machinery until tomorrow (because of the sedation medicines used during the test).    FOLLOW UP: Our staff will call the number listed on your records the next business day following your procedure to check on you and address any questions or concerns that you may have regarding the information given to you following your procedure. If we do not reach you, we will leave a message.  However, if you are feeling well and you are not experiencing any problems, there is no need to return our call.  We will assume that you have returned to your regular daily activities without incident.  Thank-you for choosing [place] for your healthcare needs today.  If any biopsies were taken you will be contacted by phone or by letter within the next 1-3 weeks.  Please call us at [phone] if you have not heard about the biopsies in 3 weeks.  SIGNATURES/CONFIDENTIALITY: You and/or your care partner have signed paperwork which will be entered into your electronic medical record.  These signatures attest to the fact that that the information above on your After Visit Summary has been reviewed and is understood.  Full responsibility of the confidentiality of this discharge information lies with you and/or your care-partner.

## 2015-09-14 NOTE — Progress Notes (Signed)
Patient had tachypnea and tachycardia for a few mins in recovery and was not totally alert - resolved spontaneously - no pain, nausea, vomiting.

## 2015-09-14 NOTE — Progress Notes (Signed)
Report to PACU, RN, vss, BBS= Clear.  

## 2015-09-14 NOTE — Op Note (Signed)
[facility] Patient Name: Tina Hinton Procedure Date: 09/14/2015 3:32 PM MRN: [id number] Endoscopist: Gatha Mayer , MD Age: 60 Date of Birth: 16-Jul-1955 Gender: Female Procedure:                Colonoscopy Indications:              Screening for colorectal malignant neoplasm Medicines:                Propofol per Anesthesia, Monitored Anesthesia Care Procedure:                Pre-Anesthesia Assessment:                           - Prior to the procedure, a History and Physical                            was performed, and patient medications and                            allergies were reviewed. The patient's tolerance of                            previous anesthesia was also reviewed. The risks                            and benefits of the procedure and the sedation                            options and risks were discussed with the patient.                            All questions were answered, and informed consent                            was obtained. Prior Anticoagulants: The patient has                            taken no previous anticoagulant or antiplatelet                            agents. ASA Grade Assessment: III - A patient with                            severe systemic disease. After reviewing the risks                            and benefits, the patient was deemed in                            satisfactory condition to undergo the procedure.                           After obtaining informed consent, the colonoscope  was passed under direct vision. Throughout the                            procedure, the patient's blood pressure, pulse, and                            oxygen saturations were monitored continuously. The                            Model CF-HQ190L [id number]) scope was introduced                            through the anus and advanced to the the cecum,                            identified by  appendiceal orifice and ileocecal                            valve. The colonoscopy was performed without                            difficulty. The patient tolerated the procedure                            well. The quality of the bowel preparation was                            good. The bowel preparation used was Miralax. The                            ileocecal valve, appendiceal orifice, and rectum                            were photographed. Scope In: 3:42:07 PM Scope Out: 3:57:35 PM Scope Withdrawal Time: 0 hours 10 minutes 47 seconds  Total Procedure Duration: 0 hours 15 minutes 28 seconds  Findings:                 The perianal and digital rectal examinations were                            normal.                           A localized area of altered vascular, erythematous                            and thickened folds of the mucosa was found in the                            rectum. Biopsies were taken with a cold forceps for                            histology. Verification of patient identification  for the specimen was done. Estimated blood loss was                            minimal.                           Multiple small and large-mouthed diverticula were                            found in the left colon. There was narrowing of the                            colon in association with the diverticular opening.                           Scattered large-mouthed diverticula were found in                            the right colon. There was no evidence of                            diverticular bleeding.                           The exam was otherwise without abnormality on                            direct and retroflexion views. Complications:            No immediate complications. Estimated Blood Loss:     Estimated blood loss was minimal. Impression:               - Altered vascular, erythematous and thickened                             folds of the mucosa in the rectum. Biopsied.                           - Severe diverticulosis in the left colon. There                            was narrowing of the colon in association with the                            diverticular opening.                           - Mild diverticulosis in the right colon. There was                            no evidence of diverticular bleeding.                           - The examination was otherwise normal on direct  and retroflexion views.                           - Abnormal fold probably scope trauma but some ? if                            neoplastic. Recommendation:           - Patient has a contact number available for                            emergencies. The signs and symptoms of potential                            delayed complications were discussed with the                            patient. Return to normal activities tomorrow.                            Written discharge instructions were provided to the                            patient.                           - Resume previous diet.                           - Continue present medications.                           - Repeat colonoscopy is recommended. The                            colonoscopy date will be determined after pathology                            results from today's exam become available for                            review. Gatha Mayer, MD 09/14/2015 4:12:07 PM This report has been signed electronically. CC Letter to:             Roma Schanz, DO

## 2015-09-14 NOTE — Progress Notes (Addendum)
Patient was doing fine until I started to take out her IV.  Then she started to hyperventilate and couldn't tell me what was wrong.  Her abd is soft and her vitals are normal. Dr. Carlean Purl was called to recovery and Josh Monday,  CRNA. Dr. Carlean Purl evaluated the patient, and spoke to her.  It was determined that the pateint had a panic attack or was coming out of anesthesia.  Patient was trying to get out of the bed 7 minutes after, but then settled down.  Patient was discharged without further issues.

## 2015-09-14 NOTE — Progress Notes (Signed)
Called to room to assist during endoscopic procedure.  Patient ID and intended procedure confirmed with present staff. Received instructions for my participation in the procedure from the performing physician.  

## 2015-09-17 ENCOUNTER — Telehealth: Payer: Self-pay | Admitting: *Deleted

## 2015-09-17 NOTE — Telephone Encounter (Signed)
Left message that we called for f/u 

## 2015-09-19 ENCOUNTER — Encounter: Payer: Self-pay | Admitting: Internal Medicine

## 2015-09-20 ENCOUNTER — Other Ambulatory Visit: Payer: Self-pay

## 2015-09-20 DIAGNOSIS — M170 Bilateral primary osteoarthritis of knee: Secondary | ICD-10-CM

## 2015-09-20 MED ORDER — DICLOFENAC SODIUM 75 MG PO TBEC: 75.0000 mg | DELAYED_RELEASE_TABLET | Freq: Two times a day (BID) | ORAL | Status: DC

## 2015-09-27 ENCOUNTER — Ambulatory Visit: Payer: Federal, State, Local not specified - PPO | Admitting: Family Medicine

## 2015-09-28 ENCOUNTER — Ambulatory Visit: Payer: Federal, State, Local not specified - PPO | Admitting: Family Medicine

## 2015-10-05 ENCOUNTER — Ambulatory Visit (INDEPENDENT_AMBULATORY_CARE_PROVIDER_SITE_OTHER): Admitting: Family Medicine

## 2015-10-05 VITALS — BP 118/76 | HR 88 | Temp 98.5°F | Resp 16 | Ht 61.5 in | Wt 222.0 lb

## 2015-10-05 DIAGNOSIS — M25562 Pain in left knee: Secondary | ICD-10-CM | POA: Diagnosis not present

## 2015-10-05 DIAGNOSIS — S46911A Strain of unspecified muscle, fascia and tendon at shoulder and upper arm level, right arm, initial encounter: Secondary | ICD-10-CM | POA: Diagnosis not present

## 2015-10-05 DIAGNOSIS — M25462 Effusion, left knee: Secondary | ICD-10-CM

## 2015-10-05 DIAGNOSIS — S8002XA Contusion of left knee, initial encounter: Secondary | ICD-10-CM

## 2015-10-05 DIAGNOSIS — S46912A Strain of unspecified muscle, fascia and tendon at shoulder and upper arm level, left arm, initial encounter: Secondary | ICD-10-CM

## 2015-10-05 NOTE — Progress Notes (Signed)
Patient ID: Tina Hinton, female    DOB: 1955-12-11  Age: 60 y.o. MRN: [id number]  Chief Complaint  Patient presents with  . 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 She was in [place] for a meeting this week and tripped on the sidewalk Wednesday. She did a quick fall forward on her knees and outstretched arms. She was taken to [facility] and evaluated in the ER with x-rays. No fracture of her knee. She was not hurting in the arms at that time. Subsequently she developed a lot of bruising of the left knee. She was treated with pain medicine and muscle relaxant medication. She is continuing to hurt quite a lot in the left knee despite wearing the immobilizer and using crutches. She flew back to [facility] yesterday and came in here for her Worker's Comp. evaluation. She has a Network engineer job.  Current allergies, medications, problem list, past/family and social histories reviewed.  Objective:  BP 118/76 mmHg  Pulse 88  Temp(Src) 98.5 F (36.9 C) (Oral)  Resp 16  Ht 5' 1.5" (1.562 m)  Wt 222 lb (100.699 kg)  BMI 41.27 kg/m2  SpO2 97%  No major acute distress. She has some passive motion of her arms. She is tender in the upper arm bilaterally, right more than left. Range of motion is satisfactory. Shoulder girdle itself does not seem terribly tender. The left knee has significant bruising anteriorly and slightly more medially. She is not tender on the lateral collateral region, but is tender on the medial aspect of the knee. There is a palpable effusion. Tissues are too tender to do any range of motion or deep palpation.  Assessment & Plan:   Assessment: 1. Contusion, knee, left, initial encounter   2. Effusion of left knee   3. Pain, joint, knee, left   4. Right shoulder strain, initial encounter   5. Strain of left upper arm, initial encounter   6. Strain of right upper arm, initial encounter        Plan: Treat symptomatically and recheck next week.  No orders of the defined types were placed in this encounter.    Meds ordered this encounter  Medications  . Loratadine (CLARITIN PO)    Sig: Take by mouth.         Patient Instructions   Out of work until next Thursday when she will be rechecked  Use the muscle relaxant and pain medication as needed, as well as taking ibuprofen for pain and inflammation  Ice the left knee for 5 times daily for 15-20 minutes  When ambulatory use the knee immobilizer and crutches  Elevate and rest  Return Thursday, sooner if needed    IF you received an x-ray today, you will receive an invoice from [facility]. Please contact [facility] at [phone] with questions or concerns regarding your invoice.   IF you received labwork today, you will receive an invoice from [facility]. Please contact [facility] at [phone] with questions or concerns regarding your invoice.   Our billing staff will not be able to assist you with questions regarding bills from these companies.  You will be contacted with the lab results as soon as they are available. The fastest way to get your results is to activate your My Chart account. Instructions are located on the last page of this paperwork. If you have not heard  from [place] regarding the results in 2 weeks, please contact this office.          Return in about 6 days (around 10/11/2015).   Kayleen Alig, MD 10/05/2015

## 2015-10-05 NOTE — Patient Instructions (Addendum)
Out of work until next Thursday when she will be rechecked  Use the muscle relaxant and pain medication as needed, as well as taking ibuprofen for pain and inflammation  Ice the left knee for 5 times daily for 15-20 minutes  When ambulatory use the knee immobilizer and crutches  Elevate and rest  Return Thursday, sooner if needed    IF you received an x-ray today, you will receive an invoice from [facility]. Please contact [facility] at [phone] with questions or concerns regarding your invoice.   IF you received labwork today, you will receive an invoice from [facility] [employer]. Please contact [facility] at [phone] with questions or concerns regarding your invoice.   Our billing staff will not be able to assist you with questions regarding bills from these companies.  You will be contacted with the lab results as soon as they are available. The fastest way to get your results is to activate your My Chart account. Instructions are located on the last page of this paperwork. If you have not heard from [place] regarding the results in 2 weeks, please contact this office.

## 2015-10-11 ENCOUNTER — Ambulatory Visit (INDEPENDENT_AMBULATORY_CARE_PROVIDER_SITE_OTHER): Admitting: Family Medicine

## 2015-10-11 ENCOUNTER — Ambulatory Visit: Payer: Federal, State, Local not specified - PPO | Admitting: Family Medicine

## 2015-10-11 VITALS — BP 119/84 | HR 107 | Temp 97.9°F | Resp 17 | Ht 61.5 in | Wt 222.0 lb

## 2015-10-11 DIAGNOSIS — S46911D Strain of unspecified muscle, fascia and tendon at shoulder and upper arm level, right arm, subsequent encounter: Secondary | ICD-10-CM

## 2015-10-11 DIAGNOSIS — F411 Generalized anxiety disorder: Secondary | ICD-10-CM

## 2015-10-11 DIAGNOSIS — M25462 Effusion, left knee: Secondary | ICD-10-CM | POA: Diagnosis not present

## 2015-10-11 MED ORDER — ALPRAZOLAM 0.25 MG PO TABS: 0.2500 mg | ORAL_TABLET | Freq: Every evening | ORAL | Status: DC | PRN

## 2015-10-11 NOTE — Progress Notes (Signed)
Previous note:   Posey Boyer, MD at 10/05/2015 12:27 PM     Status: Signed       Expand All Collapse All   Patient ID: Tina Hinton, female DOB: [birth year] Age: 60 y.o. MRN: [id number]  Chief Complaint  Patient presents with  . 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 She was in [place] for a meeting this week and tripped on the sidewalk Wednesday. She did a quick fall forward on her knees and outstretched arms. She was taken to [facility] and evaluated in the ER with x-rays. No fracture of her knee. She was not hurting in the arms at that time. Subsequently she developed a lot of bruising of the left knee. She was treated with pain medicine and muscle relaxant medication. She is continuing to hurt quite a lot in the left knee despite wearing the immobilizer and using crutches. She flew back to [facility] yesterday and came in here for her Worker's Comp. evaluation. She has a Network engineer job.       Some of the swelling has subsided in the knee but the ecchymosis is quite prominent.  The right  Shoulder is resolving. When the knee is dependent, it really throbs.  Using a walker and knee immobilizer.  Objective:  BP 119/84 mmHg  Pulse 107  Temp(Src) 97.9 F (36.6 C) (Oral)  Resp 17  Ht 5' 1.5" (1.562 m)  Wt 222 lb (100.699 kg)  BMI 41.27 kg/m2  SpO2 98% Left knee:  Less effusion, 10-12 cm indurated ecchymotic anterior left knee.   Right shoulder:  Good  ROM  X-rays again reviewed:  No bony injury, effusion  Assessment:  I am very concerned for an internal derangement of the left knee.  The right shoulder is improving.  Plan: recheck in 1 week Knee effusion, left - Plan: Ambulatory referral to Orthopedic Surgery, MR Knee Left  Wo Contrast, ALPRAZolam (XANAX) 0.25 MG tablet  Right shoulder strain, subsequent encounter - Plan: Ambulatory referral to Orthopedic Surgery, MR  Knee Left  Wo Contrast  Generalized anxiety disorder - Plan: ALPRAZolam Duanne Moron) 0.25 MG tablet  Robyn Haber, MD

## 2015-10-11 NOTE — Patient Instructions (Addendum)
Recheck in one week

## 2015-10-18 ENCOUNTER — Ambulatory Visit (INDEPENDENT_AMBULATORY_CARE_PROVIDER_SITE_OTHER): Admitting: Family Medicine

## 2015-10-18 VITALS — BP 124/80 | HR 98 | Temp 97.7°F | Resp 18 | Ht 61.5 in | Wt 224.0 lb

## 2015-10-18 DIAGNOSIS — S8002XA Contusion of left knee, initial encounter: Secondary | ICD-10-CM | POA: Diagnosis not present

## 2015-10-18 DIAGNOSIS — M25562 Pain in left knee: Secondary | ICD-10-CM | POA: Diagnosis not present

## 2015-10-18 DIAGNOSIS — M25462 Effusion, left knee: Secondary | ICD-10-CM | POA: Diagnosis not present

## 2015-10-18 NOTE — Progress Notes (Signed)
Subjective:    Patient ID: Tina Hinton, female    DOB: 08-26-1955, 60 y.o.   MRN: [id number] By signing my name below, I, Tina Hinton, attest that this documentation has been prepared under the direction and in the presence of Tina Cheadle, MD. Electronically Signed: Judithe Hinton, ER Scribe. 10/18/2015. 10:14 AM.  Chief Complaint  Patient presents with  . Follow-up    WC/MD only    HPI HPI Comments: Tina Hinton is a 60 y.o. female who presents to [facility] for a follow up for a workers comp injury to her right knee, after a fall on 04/26/28017. She has an MRI scheduled for Sunday. She continues to have pain in her right knee and hip. She has a past hx of bursitis in her right knee. Her orthopedist is in [place], and she has not been able to schedule an appointment yet. She has been taking ibuprofen two times per day, morning and night, as well as aspirin daily. She also takes cozar and fenofibrate. She is not taking methylcarbinol. She stopped icing her knee three days ago. She is wearing a knee immobilizer and using a walker. She fractured her bilateral feet last summer.    Past Medical History  Diagnosis Date  . Asthma   . Chicken pox   . Depression   . GERD (gastroesophageal reflux disease)   . Hyperlipidemia   . Clavicle fracture     Left  . Ankle fracture     Right  . Foot fracture, left   . Seasonal allergies   . Anxiety   . Arthritis     hands, knees  . Uterine polyp     ablation   Allergies  Allergen Reactions  . Phenylephrine-Guaifenesin Rash   Current Outpatient Prescriptions on File Prior to Visit  Medication Sig Dispense Refill  . albuterol (PROVENTIL HFA;VENTOLIN HFA) 108 (90 Base) MCG/ACT inhaler 2 puffs qid prn 1 Inhaler 2  . ALPRAZolam (XANAX) 0.25 MG tablet Take 1 tablet (0.25 mg total) by mouth at bedtime as needed. 30 tablet 1  . aspirin 81 MG tablet Take 81 mg by mouth daily. Reported on 10/11/2015    . diclofenac (VOLTAREN) 75 MG EC  tablet Take 1 tablet (75 mg total) by mouth 2 (two) times daily. 180 tablet 1  . fenofibrate 160 MG tablet Take 1 tablet (160 mg total) by mouth daily. 30 tablet 5  . ibuprofen (ADVIL,MOTRIN) 800 MG tablet Take 800 mg by mouth every 8 (eight) hours as needed.    . lansoprazole (PREVACID) 30 MG capsule Take 30 mg by mouth daily at 12 noon. Reported on 10/11/2015    . Loratadine (CLARITIN PO) Take by mouth. Reported on 10/11/2015    . methocarbamol (ROBAXIN) 750 MG tablet Take 1 tablet (750 mg total) by mouth 2 (two) times daily as needed for muscle spasms. 60 tablet 0  . phentermine (ADIPEX-P) 37.5 MG tablet Take 1 tablet (37.5 mg total) by mouth daily before breakfast. 30 tablet 0  . rosuvastatin (CRESTOR) 20 MG tablet Take 1 tablet (20 mg total) by mouth at bedtime. 30 tablet 5  . Triamcinolone Acetonide (TRIAMCINOLONE 0.1 % CREAM : EUCERIN) CREA Apply 1 application topically 3 (three) times daily as needed for rash or itching. 1 each 3   No current facility-administered medications on file prior to visit.    Review of Systems  Constitutional: Negative for fever and chills.  Musculoskeletal: Positive for joint swelling and gait problem.  Skin:  Positive for color change. Negative for wound.  Neurological: Positive for weakness (Secondary to pain). Negative for numbness.      Objective:  BP 124/80 mmHg  Pulse 98  Temp(Src) 97.7 F (36.5 C) (Oral)  Resp 18  Ht 5' 1.5" (1.562 m)  Wt 224 lb (101.606 kg)  BMI 41.64 kg/m2  SpO2 98%  Physical Exam  Constitutional: She is oriented to person, place, and time. She appears well-developed and well-nourished. No distress.  HENT:  Head: Normocephalic and atraumatic.  Eyes: Pupils are equal, round, and reactive to light.  Neck: Neck supple.  Cardiovascular: Normal rate.   Pulmonary/Chest: Effort normal. No respiratory distress.  Musculoskeletal: Normal range of motion.  Right knee: most pain is over the medial left distal portion of the knee.  11/12cm hematoma on the medial distal aspect of the knee. 2/5 hematoma on the lateral posterior aspect of the knee. She has mild limitation in flexion, moderate limitation in extension.   Neurological: She is alert and oriented to person, place, and time. Coordination normal.  Skin: Skin is warm and dry. She is not diaphoretic.  Psychiatric: She has a normal mood and affect. Her behavior is normal.  Nursing note and vitals reviewed.     Assessment & Plan:   1. Contusion, knee, left, initial encounter   2. Effusion of left knee   3. Pain, joint, knee, left    WC case - pt has MRI sched in 2d. Has gotten approval to see ortho surg but has not yet been able to get appt - pt will continue to try to contact them. Advised pt to recheck in 4d so that we can review her MRI results at that visit - reading will likely be under Lauenstein's inbox as he ordered. Continue with walker and knee immobilizer due to severity of pain.  Cont oow and reassess after MRI.  I personally performed the services described in this documentation, which was scribed in my presence. The recorded information has been reviewed and considered, and addended by me as needed.  Tina Cheadle, MD MPH

## 2015-10-18 NOTE — Patient Instructions (Signed)
     IF you received an x-ray today, you will receive an invoice from [facility]. Please contact [facility] at [phone] with questions or concerns regarding your invoice.   IF you received labwork today, you will receive an invoice from [facility]. Please contact [facility] at [phone] with questions or concerns regarding your invoice.   Our billing staff will not be able to assist you with questions regarding bills from these companies.  You will be contacted with the lab results as soon as they are available. The fastest way to get your results is to activate your My Chart account. Instructions are located on the last page of this paperwork. If you have not heard from us regarding the results in 2 weeks, please contact this office.      

## 2015-10-21 ENCOUNTER — Inpatient Hospital Stay: Admission: RE | Admit: 2015-10-21 | Payer: Federal, State, Local not specified - PPO | Source: Ambulatory Visit

## 2015-10-22 ENCOUNTER — Ambulatory Visit (INDEPENDENT_AMBULATORY_CARE_PROVIDER_SITE_OTHER): Admitting: Family Medicine

## 2015-10-22 VITALS — BP 118/88 | HR 120 | Temp 97.2°F | Resp 16 | Ht 61.5 in | Wt 227.0 lb

## 2015-10-22 DIAGNOSIS — S8002XD Contusion of left knee, subsequent encounter: Secondary | ICD-10-CM | POA: Diagnosis not present

## 2015-10-22 DIAGNOSIS — M25562 Pain in left knee: Secondary | ICD-10-CM | POA: Diagnosis not present

## 2015-10-22 NOTE — Progress Notes (Signed)
Patient ID: Tina Hinton, female    DOB: [birth year]  Age: 59 y.o. MRN: [id number]  Chief Complaint  Patient presents with  . Follow-up    workers comp    Subjective:   Patient continues to hurt in her left knee and it feels a little unstable. She is most comfortable walking with the walker still and she has the fear of falling from a giving way. The knee immobilizer is bothering her. She is getting antsy staying at home and would like to go back to work part-time if she can at some point. The MRI was not yet approved. She has an orthopedic referral for tomorrow to Dr. Mayer Camel.  Current allergies, medications, problem list, past/family and social histories reviewed.  Objective:  BP 118/88 mmHg  Pulse 120  Temp(Src) 97.2 F (36.2 C) (Oral)  Resp 16  Ht 5' 1.5" (1.562 m)  Wt 227 lb (102.967 kg)  BMI 42.20 kg/m2  SpO2 97%  No major distress. The left knee has ecchymosis from the kneecap down the upper shin a little ways. It is tender in the prepatellar area with some fluctuance there consistent with hematoma. She has tenderness of the patella down to the proximal tibial region. No effusion can be palpated in the joint itself.  Assessment & Plan:   Assessment: 1. Traumatic hematoma of knee, left, subsequent encounter   2. Knee pain, acute, left       Plan: Per instructions.  No orders of the defined types were placed in this encounter.    No orders of the defined types were placed in this encounter.         Patient Instructions   Wear the new knee brace as tolerated  Continue elevating much of the time.  Approved to return to work 3 hours a day for now. Follow-up should be done by Dr. Mayer Camel. If necessary can come back here, but Worker's Comp. usually expect you to have moved on to the orthopedist once he sees you.  Your activity should be gradually advanced as you're doing better.  We will give you a parking permit form.  IF you received an x-ray today, you  will receive an invoice from [facility]. Please contact [facility] at [phone] with questions or concerns regarding your invoice.   IF you received labwork today, you will receive an invoice from [employer]. Please contact [facility] at [phone] with questions or concerns regarding your invoice.   Our billing staff will not be able to assist you with questions regarding bills from these companies.  You will be contacted with the lab results as soon as they are available. The fastest way to get your results is to activate your My Chart account. Instructions are located on the last page of this paperwork. If you have not heard from [place] regarding the results in 2 weeks, please contact this office.          Return if symptoms worsen or fail to improve.   Chena Chohan, MD 10/22/2015

## 2015-10-22 NOTE — Patient Instructions (Addendum)
Wear the new knee brace as tolerated  Continue elevating much of the time.  Approved to return to work 3 hours a day for now. Follow-up should be done by Dr. Mayer Camel. If necessary can come back here, but Worker's Comp. usually expect you to have moved on to the orthopedist once he sees you.  Your activity should be gradually advanced as you're doing better.  We will give you a parking permit form.  IF you received an x-ray today, you will receive an invoice from [facility]. Please contact [facility] at [phone] with questions or concerns regarding your invoice.   IF you received labwork today, you will receive an invoice from [employer]. Please contact [facility] at [phone] with questions or concerns regarding your invoice.   Our billing staff will not be able to assist you with questions regarding bills from these companies.  You will be contacted with the lab results as soon as they are available. The fastest way to get your results is to activate your My Chart account. Instructions are located on the last page of this paperwork. If you have not heard from [place] regarding the results in 2 weeks, please contact this office.

## 2015-10-30 ENCOUNTER — Encounter: Payer: Self-pay | Admitting: Family Medicine

## 2015-10-30 ENCOUNTER — Ambulatory Visit (INDEPENDENT_AMBULATORY_CARE_PROVIDER_SITE_OTHER): Payer: Federal, State, Local not specified - PPO | Admitting: Family Medicine

## 2015-10-30 VITALS — BP 110/76 | HR 91 | Temp 98.7°F | Ht 62.0 in | Wt 226.6 lb

## 2015-10-30 DIAGNOSIS — S8002XA Contusion of left knee, initial encounter: Secondary | ICD-10-CM | POA: Diagnosis not present

## 2015-10-30 DIAGNOSIS — E785 Hyperlipidemia, unspecified: Secondary | ICD-10-CM

## 2015-10-30 DIAGNOSIS — E669 Obesity, unspecified: Secondary | ICD-10-CM | POA: Diagnosis not present

## 2015-10-30 DIAGNOSIS — H9190 Unspecified hearing loss, unspecified ear: Secondary | ICD-10-CM | POA: Diagnosis not present

## 2015-10-30 NOTE — Progress Notes (Signed)
Patient ID: Tina Hinton, female    DOB: 1955/09/15  Age: 60 y.o. MRN: [id number]    Subjective:  Subjective HPI Tina Hinton presents for f/u cholesterol and weight loss --- she fell and hurt her L knee while at a conference in [place] 3 weeks ago.  She has seen ortho but its a worker comp case so she is still waiting for her MRI.  She is off the phenteramine until she gets her knee taken care of.     Review of Systems  Constitutional: Negative for diaphoresis, appetite change, fatigue and unexpected weight change.  Eyes: Negative for pain, redness and visual disturbance.  Respiratory: Negative for cough, chest tightness, shortness of breath and wheezing.   Cardiovascular: Negative for chest pain, palpitations and leg swelling.  Endocrine: Negative for cold intolerance, heat intolerance, polydipsia, polyphagia and polyuria.  Genitourinary: Negative for dysuria, frequency and difficulty urinating.  Musculoskeletal: Positive for joint swelling and gait problem. Negative for back pain.  Neurological: Negative for dizziness, light-headedness, numbness and headaches.    History Past Medical History  Diagnosis Date  . Asthma   . Chicken pox   . Depression   . GERD (gastroesophageal reflux disease)   . Hyperlipidemia   . Clavicle fracture     Left  . Ankle fracture     Right  . Foot fracture, left   . Seasonal allergies   . Anxiety   . Arthritis     hands, knees  . Uterine polyp     ablation    She has past surgical history that includes Tonsillectomy; Mandible surgery; Cesarean section; Tubal ligation; Endometrial ablation; Colonoscopy; and BIOSPY.   Her family history includes Arthritis in her maternal grandmother and mother; Breast cancer in her maternal aunt and paternal grandmother; Breast cancer (age of onset: 73) in her mother; Diabetes in her maternal grandmother; Heart disease in her maternal grandmother; Prostate cancer in her paternal grandfather. There  is no history of Colon cancer.She reports that she has never smoked. She has never used smokeless tobacco. She reports that she does not drink alcohol or use illicit drugs.  Current Outpatient Prescriptions on File Prior to Visit  Medication Sig Dispense Refill  . albuterol (PROVENTIL HFA;VENTOLIN HFA) 108 (90 Base) MCG/ACT inhaler 2 puffs qid prn 1 Inhaler 2  . ALPRAZolam (XANAX) 0.25 MG tablet Take 1 tablet (0.25 mg total) by mouth at bedtime as needed. 30 tablet 1  . aspirin 81 MG tablet Take 81 mg by mouth daily. Reported on 10/11/2015    . ibuprofen (ADVIL,MOTRIN) 800 MG tablet Take 800 mg by mouth every 8 (eight) hours as needed.    . lansoprazole (PREVACID) 30 MG capsule Take 30 mg by mouth daily at 12 noon. Reported on 10/11/2015    . Loratadine (CLARITIN PO) Take by mouth. Reported on 10/11/2015    . methocarbamol (ROBAXIN) 750 MG tablet Take 1 tablet (750 mg total) by mouth 2 (two) times daily as needed for muscle spasms. 60 tablet 0  . Triamcinolone Acetonide (TRIAMCINOLONE 0.1 % CREAM : EUCERIN) CREA Apply 1 application topically 3 (three) times daily as needed for rash or itching. 1 each 3   No current facility-administered medications on file prior to visit.     Objective:  Objective Physical Exam  Constitutional: She is oriented to person, place, and time. She appears well-developed and well-nourished.  HENT:  Head: Normocephalic and atraumatic.  Eyes: Conjunctivae and EOM are normal.  Neck: Normal range of motion. Neck  supple. No JVD present. Carotid bruit is not present. No thyromegaly present.  Cardiovascular: Normal rate, regular rhythm and normal heart sounds.   No murmur heard. Pulmonary/Chest: Effort normal and breath sounds normal. No respiratory distress. She has no wheezes. She has no rales. She exhibits no tenderness.  Musculoskeletal: She exhibits edema and tenderness.       Left knee: She exhibits decreased range of motion and swelling. Tenderness found.    Neurological: She is alert and oriented to person, place, and time.  Psychiatric: She has a normal mood and affect. Her behavior is normal. Judgment and thought content normal.  Nursing note and vitals reviewed.  BP 110/76 mmHg  Pulse 91  Temp(Src) 98.7 F (37.1 C) (Oral)  Ht 5\' 2"  (1.575 m)  Wt 226 lb 9.6 oz (102.785 kg)  BMI 41.44 kg/m2  SpO2 97% Wt Readings from Last 3 Encounters:  10/30/15 226 lb 9.6 oz (102.785 kg)  10/22/15 227 lb (102.967 kg)  10/18/15 224 lb (101.606 kg)     Lab Results  Component Value Date   WBC 8.2 06/25/2015   HGB 12.7 06/25/2015   HCT 39.4 06/25/2015   PLT 272.0 06/25/2015   GLUCOSE 84 06/25/2015   CHOL 232* 06/25/2015   TRIG 216.0* 06/25/2015   HDL 38.50* 06/25/2015   LDLDIRECT 155.0 06/25/2015   ALT 17 06/25/2015   AST 21 06/25/2015   NA 141 06/25/2015   K 4.1 06/25/2015   CL 102 06/25/2015   CREATININE 0.76 06/25/2015   BUN 17 06/25/2015   CO2 32 06/25/2015   TSH 1.10 06/25/2015    Mm Digital Screening Bilateral  07/03/2015  CLINICAL DATA:  Screening. EXAM: DIGITAL SCREENING BILATERAL MAMMOGRAM WITH CAD COMPARISON:  None. ACR Breast Density Category b: There are scattered areas of fibroglandular density. FINDINGS: There are no findings suspicious for malignancy. Images were processed with CAD. IMPRESSION: No mammographic evidence of malignancy. A result letter of this screening mammogram will be mailed directly to the patient. RECOMMENDATION: Screening mammogram in one year. (Code:SM-B-01Y) BI-RADS CATEGORY  1: Negative. Electronically Signed   By: Curlene Dolphin M.D.   On: 07/03/2015 09:27     Assessment & Plan:  Plan I have discontinued Tina Hinton's phentermine. I am also having her maintain her lansoprazole, albuterol, methocarbamol, triamcinolone 0.1 % cream : eucerin, aspirin, Loratadine (CLARITIN PO), ibuprofen, ALPRAZolam, diclofenac, rosuvastatin, and fenofibrate.  Meds ordered this encounter  Medications  . diclofenac  (VOLTAREN) 75 MG EC tablet    Sig: TAKE 1 TABLET (75 MG TOTAL) BY MOUTH 2 (TWO) TIMES DAILY.    Refill:  2  . rosuvastatin (CRESTOR) 20 MG tablet    Sig: Take 1 tablet (20 mg total) by mouth at bedtime.    Dispense:  30 tablet    Refill:  5  . fenofibrate 160 MG tablet    Sig: Take 1 tablet (160 mg total) by mouth daily.    Dispense:  30 tablet    Refill:  5    Problem List Items Addressed This Visit    None    Visit Diagnoses    Hyperlipidemia    -  Primary    Relevant Medications    rosuvastatin (CRESTOR) 20 MG tablet    fenofibrate 160 MG tablet    Other Relevant Orders    Comprehensive metabolic panel    Lipid panel    Obesity        Knee contusion, left, initial encounter  Decreased hearing, unspecified laterality        Relevant Orders    Ambulatory referral to ENT    Hyperlipidemia LDL goal <100        Relevant Medications    rosuvastatin (CRESTOR) 20 MG tablet    fenofibrate 160 MG tablet       Follow-up: Return in about 6 months (around 05/01/2016), or if symptoms worsen or fail to improve.  Ann Held, DO

## 2015-10-30 NOTE — Patient Instructions (Signed)

## 2015-11-08 MED ORDER — FENOFIBRATE 160 MG PO TABS: 160.0000 mg | ORAL_TABLET | Freq: Every day | ORAL | Status: DC

## 2015-11-08 MED ORDER — ROSUVASTATIN CALCIUM 20 MG PO TABS: 20.0000 mg | ORAL_TABLET | Freq: Every day | ORAL | Status: DC

## 2015-12-19 ENCOUNTER — Other Ambulatory Visit: Payer: Self-pay | Admitting: Orthopedic Surgery

## 2015-12-19 DIAGNOSIS — M25562 Pain in left knee: Secondary | ICD-10-CM

## 2015-12-24 ENCOUNTER — Ambulatory Visit
Admission: RE | Admit: 2015-12-24 | Discharge: 2015-12-24 | Disposition: A | Discharge status: Home / Self Care | Payer: Worker's Compensation | Source: Ambulatory Visit | Attending: Orthopedic Surgery | Admitting: Orthopedic Surgery

## 2015-12-24 DIAGNOSIS — M25562 Pain in left knee: Secondary | ICD-10-CM

## 2016-01-15 ENCOUNTER — Other Ambulatory Visit (INDEPENDENT_AMBULATORY_CARE_PROVIDER_SITE_OTHER): Payer: Federal, State, Local not specified - PPO

## 2016-01-15 DIAGNOSIS — E785 Hyperlipidemia, unspecified: Secondary | ICD-10-CM | POA: Diagnosis not present

## 2016-01-15 LAB — COMPREHENSIVE METABOLIC PANEL
ALK PHOS: 64 U/L (ref 39–117)
ALT: 20 U/L (ref 0–35)
AST: 19 U/L (ref 0–37)
Albumin: 3.9 g/dL (ref 3.5–5.2)
BUN: 23 mg/dL (ref 6–23)
CO2: 31 mEq/L (ref 19–32)
Calcium: 9.5 mg/dL (ref 8.4–10.5)
Chloride: 104 mEq/L (ref 96–112)
Creatinine, Ser: 0.87 mg/dL (ref 0.40–1.20)
GFR: 70.46 mL/min (ref >60.00)
GLUCOSE: 81 mg/dL (ref 70–99)
POTASSIUM: 5 meq/L (ref 3.5–5.1)
Sodium: 141 mEq/L (ref 135–145)
TOTAL PROTEIN: 7.1 g/dL (ref 6.0–8.3)
Total Bilirubin: 0.4 mg/dL (ref 0.2–1.2)

## 2016-01-15 LAB — LIPID PANEL
Cholesterol: 145 mg/dL (ref 0–200)
HDL: 58.4 mg/dL (ref >39.00)
LDL Cholesterol: 78 mg/dL (ref 0–99)
NONHDL: 86.22
Total CHOL/HDL Ratio: 2
Triglycerides: 43 mg/dL (ref 0.0–149.0)
VLDL: 8.6 mg/dL (ref 0.0–40.0)

## 2016-01-24 ENCOUNTER — Ambulatory Visit: Payer: Self-pay | Admitting: Family Medicine

## 2016-03-31 ENCOUNTER — Emergency Department (HOSPITAL_BASED_OUTPATIENT_CLINIC_OR_DEPARTMENT_OTHER): Payer: Federal, State, Local not specified - PPO

## 2016-03-31 ENCOUNTER — Encounter (HOSPITAL_BASED_OUTPATIENT_CLINIC_OR_DEPARTMENT_OTHER): Payer: Self-pay | Admitting: *Deleted

## 2016-03-31 ENCOUNTER — Emergency Department (HOSPITAL_BASED_OUTPATIENT_CLINIC_OR_DEPARTMENT_OTHER)
Admission: EM | Admit: 2016-03-31 | Discharge: 2016-03-31 | Disposition: A | Discharge status: Home / Self Care | Payer: Federal, State, Local not specified - PPO | Attending: Physician Assistant | Admitting: Physician Assistant

## 2016-03-31 DIAGNOSIS — J45909 Unspecified asthma, uncomplicated: Secondary | ICD-10-CM | POA: Insufficient documentation

## 2016-03-31 DIAGNOSIS — Z7982 Long term (current) use of aspirin: Secondary | ICD-10-CM | POA: Diagnosis not present

## 2016-03-31 DIAGNOSIS — M79605 Pain in left leg: Secondary | ICD-10-CM | POA: Diagnosis not present

## 2016-03-31 DIAGNOSIS — Z7951 Long term (current) use of inhaled steroids: Secondary | ICD-10-CM | POA: Insufficient documentation

## 2016-03-31 DIAGNOSIS — M79662 Pain in left lower leg: Secondary | ICD-10-CM | POA: Insufficient documentation

## 2016-03-31 NOTE — Discharge Instructions (Signed)
Please read and follow all provided instructions.  Your diagnoses today include:  1. Acute pain of left lower extremity     Tests performed today include:  An ultrasound of your leg that does not show a blood clot or Baker's cyst.   Vital signs. See below for your results today.   Medications prescribed:   None  Take any prescribed medications only as directed.  Home care instructions:  Follow any educational materials contained in this packet.  Follow-up instructions: Please follow-up with the orthopedic doctor listed for further evaluation.    Return instructions:   Please return to the Emergency Department if you experience worsening symptoms.  Please return if you have any other emergent concerns.  Additional Information:  Your vital signs today were: BP 171/96 (BP Location: Left Arm)    Pulse 97    Temp 98 F (36.7 C) (Oral)    Resp 18    Ht 5\' 1"  (1.549 m)    Wt 106.1 kg    SpO2 100%    BMI 44.21 kg/m  If your blood pressure (BP) was elevated above 135/85 this visit, please have this repeated by your doctor within one month. --------------

## 2016-03-31 NOTE — ED Notes (Signed)
MD at bedside. 

## 2016-03-31 NOTE — ED Provider Notes (Signed)
[facility] Provider Note   CSN: [id number] Arrival date & time: 03/31/16  1011     History   Chief Complaint Chief Complaint  Patient presents with  . Leg Pain    HPI Tina Hinton is a 60 y.o. female.  Patient with history of chronic left knee pain stemming from an injury earlier this year -- presents with complaint of progressive pain behind her left knee over the past 10 days and swelling and tenderness to her calf that started this morning. 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 She currently works for the [employer] and performs sedentary work. She has had no redness, warmth or tenderness. No fevers, nausea or vomiting. No history of DVT or other blood clots. No recent immobilizations or long periods of travel. No chest pain or shortness of breath. No treatments prior to arrival. Patient does take a baby aspirin daily, no other anticoagulation. The onset of this condition was acute. The course is constant. Aggravating factors: none. Alleviating factors: none.         Past Medical History:  Diagnosis Date  . Ankle fracture    Right  . Anxiety   . Arthritis    hands, knees  . Asthma   . Chicken pox   . Clavicle fracture    Left  . Depression   . Foot fracture, left   . GERD (gastroesophageal reflux disease)   . Hyperlipidemia   . Seasonal allergies   . Uterine polyp    ablation    Patient Active Problem List   Diagnosis Date Noted  . Preventative health care 07/30/2015  . Morbid obesity ([place]) 07/30/2015  . Anxiety 06/25/2015  . HLD (hyperlipidemia) 06/25/2015  . Gonalgia 06/25/2015  . Osteoarthritis of both knees 06/25/2015  . Degeneration of intervertebral disc of lumbar region 01/04/2015  . Spondylolisthesis at L5-S1 level 01/04/2015  . Fracture of lateral malleolus 11/14/2014  . Foot, fracture, navicular 11/14/2014    Past Surgical History:  Procedure Laterality Date  . BIOSPY     HEAD  . CESAREAN SECTION      . COLONOSCOPY    . ENDOMETRIAL ABLATION    . MANDIBLE SURGERY    . TONSILLECTOMY    . TUBAL LIGATION      OB History    No data available       Home Medications    Prior to Admission medications   Medication Sig Start Date End Date Taking? Authorizing Provider  albuterol (PROVENTIL HFA;VENTOLIN HFA) 108 (90 Base) MCG/ACT inhaler 2 puffs qid prn 06/25/15   Alferd Apa Lowne Chase, DO  ALPRAZolam Duanne Moron) 0.25 MG tablet Take 1 tablet (0.25 mg total) by mouth at bedtime as needed. 10/11/15   Robyn Haber, MD  aspirin 81 MG tablet Take 81 mg by mouth daily. Reported on 10/11/2015    Historical Provider, MD  diclofenac (VOLTAREN) 75 MG EC tablet TAKE 1 TABLET (75 MG TOTAL) BY MOUTH 2 (TWO) TIMES DAILY. 09/16/15   Historical Provider, MD  fenofibrate 160 MG tablet Take 1 tablet (160 mg total) by mouth daily. 11/08/15   Alferd Apa Lowne Chase, DO  ibuprofen (ADVIL,MOTRIN) 800 MG tablet Take 800 mg by mouth every 8 (eight) hours as needed.    Historical Provider, MD  lansoprazole (PREVACID) 30 MG capsule Take 30 mg by mouth daily at 12 noon. Reported on 10/11/2015    Historical Provider, MD  Loratadine (CLARITIN PO) Take by mouth. Reported on 10/11/2015  Historical Provider, MD  methocarbamol (ROBAXIN) 750 MG tablet Take 1 tablet (750 mg total) by mouth 2 (two) times daily as needed for muscle spasms. 07/30/15   Rosalita Chessman Chase, DO  rosuvastatin (CRESTOR) 20 MG tablet Take 1 tablet (20 mg total) by mouth at bedtime. 11/08/15   Rosalita Chessman Chase, DO  Triamcinolone Acetonide (TRIAMCINOLONE 0.1 % CREAM : EUCERIN) CREA Apply 1 application topically 3 (three) times daily as needed for rash or itching. 08/27/15   Ann Held, DO    Family History Family History  Problem Relation Age of Onset  . Arthritis Mother   . Breast cancer Mother 44  . Arthritis Maternal Grandmother   . Heart disease Maternal Grandmother   . Diabetes Maternal Grandmother   . Breast cancer Maternal Aunt   . Breast  cancer Paternal Grandmother   . Prostate cancer Paternal Grandfather   . Colon cancer Neg Hx     Social History Social History  Substance Use Topics  . Smoking status: Never Smoker  . Smokeless tobacco: Never Used  . Alcohol use No     Allergies   Phenylephrine-guaifenesin   Review of Systems Review of Systems  Constitutional: Negative for fever.  HENT: Negative for rhinorrhea and sore throat.   Eyes: Negative for redness.  Respiratory: Negative for cough.   Cardiovascular: Positive for leg swelling. Negative for chest pain.  Gastrointestinal: Negative for abdominal pain, diarrhea, nausea and vomiting.  Genitourinary: Negative for dysuria.  Musculoskeletal: Positive for arthralgias. Negative for myalgias.  Skin: Negative for rash.  Neurological: Negative for headaches.     Physical Exam Updated Vital Signs BP 171/96 (BP Location: Left Arm)   Pulse 97   Temp 98 F (36.7 C) (Oral)   Resp 18   Ht 5\' 1"  (1.549 m)   Wt 106.1 kg   SpO2 100%   BMI 44.21 kg/m   Physical Exam  Constitutional: She appears well-developed and well-nourished.  HENT:  Head: Normocephalic and atraumatic.  Eyes: Conjunctivae are normal. Right eye exhibits no discharge. Left eye exhibits no discharge.  Neck: Normal range of motion. Neck supple.  Cardiovascular: Normal rate, regular rhythm and normal heart sounds.   Pulses:      Dorsalis pedis pulses are 2+ on the right side, and 2+ on the left side.       Posterior tibial pulses are 2+ on the right side, and 2+ on the left side.  Pulmonary/Chest: Effort normal and breath sounds normal.  Abdominal: Soft. There is no tenderness.  Musculoskeletal: She exhibits edema. She exhibits no tenderness.  Trace edema of left calf with minimal tenderness. Patient has some tenderness without significant swelling in the popliteal area. No pulsations.  Neurological: She is alert.  Skin: Skin is warm and dry.  Psychiatric: She has a normal mood and affect.   Nursing note and vitals reviewed.    ED Treatments / Results   Radiology US Venous Img Lower Unilateral Left  Result Date: 03/31/2016 CLINICAL DATA:  Pain in left popliteal fossa. EXAM: LEFT LOWER EXTREMITY VENOUS DOPPLER ULTRASOUND TECHNIQUE: Gray-scale sonography with graded compression, as well as color Doppler and duplex ultrasound were performed to evaluate the lower extremity deep venous systems from the level of the common femoral vein and including the common femoral, femoral, profunda femoral, popliteal and calf veins including the posterior tibial, peroneal and gastrocnemius veins when visible. The superficial great saphenous vein was also interrogated. Spectral Doppler was utilized to evaluate flow at  rest and with distal augmentation maneuvers in the common femoral, femoral and popliteal veins. COMPARISON:  None. FINDINGS: Contralateral Common Femoral Vein: Respiratory phasicity is normal and symmetric with the symptomatic side. No evidence of thrombus. Normal compressibility. Common Femoral Vein: No evidence of thrombus. Normal compressibility, respiratory phasicity and response to augmentation. Saphenofemoral Junction: No evidence of thrombus. Normal compressibility and flow on color Doppler imaging. Profunda Femoral Vein: No evidence of thrombus. Normal compressibility and flow on color Doppler imaging. Femoral Vein: No evidence of thrombus. Normal compressibility, respiratory phasicity and response to augmentation. Popliteal Vein: No evidence of thrombus. Normal compressibility, respiratory phasicity and response to augmentation. Calf Veins: No evidence of thrombus. Normal compressibility and flow on color Doppler imaging. Superficial Great Saphenous Vein: No evidence of thrombus. Normal compressibility and flow on color Doppler imaging. Venous Reflux:  None. Other Findings: No evidence of superficial thrombophlebitis. No popliteal fossa Baker's cyst is identified. IMPRESSION: No evidence  of left lower extremity deep venous thrombosis. Electronically Signed   By: Aletta Edouard M.D.   On: 03/31/2016 12:59    Procedures Procedures (including critical care time)     Initial Impression / Assessment and Plan / ED Course  I have reviewed the triage vital signs and the nursing notes.  Pertinent labs & imaging results that were available during my care of the patient were reviewed by me and considered in my medical decision making (see chart for details).  Clinical Course   Patient seen and examined. Work-up initiated. Will eval for DVT.  Vital signs reviewed and are as follows: BP 171/96 (BP Location: Left Arm)   Pulse 97   Temp 98 F (36.7 C) (Oral)   Resp 18   Ht 5\' 1"  (1.549 m)   Wt 106.1 kg   SpO2 100%   BMI 44.21 kg/m   1:22 PM Ultrasound negative. Patient informed. She will continue home anti-inflammatories. I've given her sports medicine referral for another opinion and further evaluation. She agrees with this plan and is comfortable for discharge to home at this time. Encourage patient to return with worsening or changing symptoms. Patient verbalizes understanding and agrees with plan.  Final Clinical Impressions(s) / ED Diagnoses   Final diagnoses:  Acute pain of left lower extremity   Patient with left lower extremity pain, acute on chronic left knee pain. Patient concerned about DVT. Ultrasound performed and is negative. No signs of cellulitis. Extremity appears to be well perfused with normal distal pulses and sensation. No injuries that I would suspect fracture. Orthopedic follow-up given. Return instructions as above.   New Prescriptions New Prescriptions   No medications on file     Carlisle Cater, PA-C 03/31/16 [place], MD 03/31/16 1431

## 2016-04-08 ENCOUNTER — Encounter: Payer: Self-pay | Admitting: Family Medicine

## 2016-04-08 ENCOUNTER — Other Ambulatory Visit (HOSPITAL_BASED_OUTPATIENT_CLINIC_OR_DEPARTMENT_OTHER): Payer: Self-pay

## 2016-04-08 ENCOUNTER — Ambulatory Visit (INDEPENDENT_AMBULATORY_CARE_PROVIDER_SITE_OTHER): Payer: Federal, State, Local not specified - PPO | Admitting: Family Medicine

## 2016-04-08 ENCOUNTER — Ambulatory Visit: Payer: Federal, State, Local not specified - PPO

## 2016-04-08 VITALS — BP 111/75 | HR 81 | Ht 62.0 in | Wt 234.0 lb

## 2016-04-08 DIAGNOSIS — M79605 Pain in left leg: Secondary | ICD-10-CM | POA: Diagnosis not present

## 2016-04-08 DIAGNOSIS — M25562 Pain in left knee: Secondary | ICD-10-CM

## 2016-04-08 DIAGNOSIS — G8929 Other chronic pain: Secondary | ICD-10-CM

## 2016-04-08 MED ORDER — NAPROXEN 500 MG PO TABS: 500.0000 mg | ORAL_TABLET | Freq: Two times a day (BID) | ORAL | 1 refills | Status: DC | PRN

## 2016-04-08 NOTE — Patient Instructions (Addendum)
Your repeat doppler ultrasound was negative for a DVT. Your knee pain is due to arthritis, the resolving hematoma, possible popliteal muscle spasms. These are the different medications you can take for this: Tylenol 500mg  1-2 tabs three times a day for pain. Naproxen 500mg  twice a day with food for pain and inflammation. Glucosamine sulfate 750mg  twice a day is a supplement that may help. Capsaicin, aspercreme, or biofreeze topically up to four times a day may also help with pain. Cortisone injections are an option. If cortisone injections do not help, there are different types of shots that may help but they take longer to take effect. It's important that you continue to stay active. Straight leg raises, knee extensions 3 sets of 10 once a day (add ankle weight if these become too easy). Knee brace for support when up and walking around. Consider physical therapy to strengthen muscles around the joint that hurts to take pressure off of the joint itself. Shoe inserts with good arch support may be helpful. Walker or cane if needed. Heat or ice 15 minutes at a time 3-4 times a day as needed to help with pain. Get a small set of pedals for under your desk and use these regularly (a little every hour). Follow up with me in 1 month or as needed.

## 2016-04-09 NOTE — Assessment & Plan Note (Signed)
Patient with swelling, persistent posterior pain and has been sedentary.  Concerned about DVT despite normal doppler u/s.  On MSK u/s today proximal calf I could not discern if one of the venous structures had a filling defect so recommended repeat doppler u/s - this was negative and reassuring.  Consistent with flare of DJD, persistent small hematoma, popliteal spasms.  We discussed tylenol, naproxen, glucosamine, topical medications.  Declined repeat cortisone shot, physical therapy for now.  Knee brace for support.  Ice/heat.  Encouraged to either get up from desk hourly and/or buy a set of pedals for under desk.  F/u in 1 month or prn.

## 2016-04-09 NOTE — Progress Notes (Signed)
PCP: Ann Held, DO  Subjective:   HPI: Patient is a 60 y.o. female here for left knee pain.  Patient reports she's had problems with left knee dating back to April 2017. Started when she was at a conference for work - walking on a sidewalk, tripped on a brick and fell forward in [place] and fell directly onto left knee. Significant bruising and swelling. Difficulty walking since then. She was seen by workers comp and ultimately released. She had an MRI 7/17 showing mild-moderate arthritic changes, small baker's cyst, and residua of hematoma She reports doing physical therapy, injection. Had done immobilizer for 3 weeks then knee brace with a walker then cane. Has a sedentary job. Concerned because of pain worsening past 2 weeks behind the left knee with associated swelling. She had an ultrasound negative for DVT on 10/23. No other skin changes, numbness.  Past Medical History:  Diagnosis Date  . Ankle fracture    Right  . Anxiety   . Arthritis    hands, knees  . Asthma   . Chicken pox   . Clavicle fracture    Left  . Depression   . Foot fracture, left   . GERD (gastroesophageal reflux disease)   . Hyperlipidemia   . Seasonal allergies   . Uterine polyp    ablation    Current Outpatient Prescriptions on File Prior to Visit  Medication Sig Dispense Refill  . albuterol (PROVENTIL HFA;VENTOLIN HFA) 108 (90 Base) MCG/ACT inhaler 2 puffs qid prn 1 Inhaler 2  . ALPRAZolam (XANAX) 0.25 MG tablet Take 1 tablet (0.25 mg total) by mouth at bedtime as needed. 30 tablet 1  . aspirin 81 MG tablet Take 81 mg by mouth daily. Reported on 10/11/2015    . fenofibrate 160 MG tablet Take 1 tablet (160 mg total) by mouth daily. 30 tablet 5  . lansoprazole (PREVACID) 30 MG capsule Take 30 mg by mouth daily at 12 noon. Reported on 10/11/2015    . Loratadine (CLARITIN PO) Take by mouth. Reported on 10/11/2015    . rosuvastatin (CRESTOR) 20 MG tablet Take 1 tablet (20 mg total) by  mouth at bedtime. 30 tablet 5  . Triamcinolone Acetonide (TRIAMCINOLONE 0.1 % CREAM : EUCERIN) CREA Apply 1 application topically 3 (three) times daily as needed for rash or itching. 1 each 3   No current facility-administered medications on file prior to visit.     Past Surgical History:  Procedure Laterality Date  . BIOSPY     HEAD  . CESAREAN SECTION    . COLONOSCOPY    . ENDOMETRIAL ABLATION    . MANDIBLE SURGERY    . TONSILLECTOMY    . TUBAL LIGATION      Allergies  Allergen Reactions  . Phenylephrine-Guaifenesin Rash    Social History   Social History  . Marital status: Divorced    Spouse name: N/A  . Number of children: N/A  . Years of education: N/A   Occupational History  . Not on file.   Social History Main Topics  . Smoking status: Never Smoker  . Smokeless tobacco: Never Used  . Alcohol use No  . Drug use: No  . Sexual activity: Not on file   Other Topics Concern  . Not on file   Social History Narrative  . No narrative on file    Family History  Problem Relation Age of Onset  . Arthritis Mother   . Breast cancer Mother 34  .  Arthritis Maternal Grandmother   . Heart disease Maternal Grandmother   . Diabetes Maternal Grandmother   . Breast cancer Maternal Aunt   . Breast cancer Paternal Grandmother   . Prostate cancer Paternal Grandfather   . Colon cancer Neg Hx     BP 111/75   Pulse 81   Ht 5\' 2"  (1.575 m)   Wt 234 lb (106.1 kg)   BMI 42.80 kg/m   Review of Systems: See HPI above.    Objective:  Physical Exam:  Gen: NAD, comfortable in exam room  Left knee: No gross deformity, ecchymoses, effusion. TTP anterior proximal shin, posterior knee in popliteal fossa, medial joint line. FROM. Negative ant/post drawers. Negative valgus/varus testing. Negative lachmanns. Negative mcmurrays, apleys, patellar apprehension. NV intact distally.  Right knee: FROM without pain.    Assessment & Plan:  1. Left knee pain - Patient with  swelling, persistent posterior pain and has been sedentary.  Concerned about DVT despite normal doppler u/s.  On MSK u/s today proximal calf I could not discern if one of the venous structures had a filling defect so recommended repeat doppler u/s - this was negative and reassuring.  Consistent with flare of DJD, persistent small hematoma, popliteal spasms.  We discussed tylenol, naproxen, glucosamine, topical medications.  Declined repeat cortisone shot, physical therapy for now.  Knee brace for support.  Ice/heat.  Encouraged to either get up from desk hourly and/or buy a set of pedals for under desk.  F/u in 1 month or prn.

## 2016-07-17 ENCOUNTER — Encounter: Payer: Self-pay | Admitting: Family Medicine

## 2016-07-17 ENCOUNTER — Other Ambulatory Visit: Payer: Self-pay | Admitting: Family Medicine

## 2016-07-17 ENCOUNTER — Ambulatory Visit (INDEPENDENT_AMBULATORY_CARE_PROVIDER_SITE_OTHER): Payer: Federal, State, Local not specified - PPO | Admitting: Family Medicine

## 2016-07-17 VITALS — BP 128/78 | HR 81 | Temp 97.8°F | Resp 16 | Ht 62.0 in | Wt 241.0 lb

## 2016-07-17 DIAGNOSIS — L309 Dermatitis, unspecified: Secondary | ICD-10-CM

## 2016-07-17 DIAGNOSIS — Z23 Encounter for immunization: Secondary | ICD-10-CM

## 2016-07-17 DIAGNOSIS — F411 Generalized anxiety disorder: Secondary | ICD-10-CM

## 2016-07-17 DIAGNOSIS — E785 Hyperlipidemia, unspecified: Secondary | ICD-10-CM

## 2016-07-17 DIAGNOSIS — Z1231 Encounter for screening mammogram for malignant neoplasm of breast: Secondary | ICD-10-CM

## 2016-07-17 DIAGNOSIS — F419 Anxiety disorder, unspecified: Secondary | ICD-10-CM

## 2016-07-17 DIAGNOSIS — M25462 Effusion, left knee: Secondary | ICD-10-CM

## 2016-07-17 DIAGNOSIS — M170 Bilateral primary osteoarthritis of knee: Secondary | ICD-10-CM

## 2016-07-17 MED ORDER — TRIAMCINOLONE 0.1 % CREAM:EUCERIN CREAM 1:1: 1.0000 "application " | TOPICAL_CREAM | Freq: Three times a day (TID) | CUTANEOUS | 3 refills | Status: DC | PRN

## 2016-07-17 MED ORDER — ALPRAZOLAM 0.25 MG PO TABS: 0.2500 mg | ORAL_TABLET | Freq: Every evening | ORAL | 1 refills | Status: DC | PRN

## 2016-07-17 MED ORDER — FENOFIBRATE 160 MG PO TABS: 160.0000 mg | ORAL_TABLET | Freq: Every day | ORAL | 5 refills | Status: DC

## 2016-07-17 MED ORDER — LIRAGLUTIDE -WEIGHT MANAGEMENT 18 MG/3ML ~~LOC~~ SOPN: 3.0000 mg | PEN_INJECTOR | Freq: Once | SUBCUTANEOUS | 5 refills | Status: AC

## 2016-07-17 MED ORDER — ROSUVASTATIN CALCIUM 20 MG PO TABS: 20.0000 mg | ORAL_TABLET | Freq: Every day | ORAL | 5 refills | Status: DC

## 2016-07-17 NOTE — Progress Notes (Signed)
Subjective:    Patient ID: Tina Hinton, female    DOB: 1955-08-27, 61 y.o.   MRN: [id number]   I acted as a Education administrator for Dr. Royden Purl, LPN   Chief Complaint  Patient presents with  . Anxiety  . Hyperlipidemia    Anxiety  Presents for follow-up visit. Patient reports no chest pain or palpitations.    Hyperlipidemia  Pertinent negatives include no chest pain.    Patient is in today for follow up anxiety and hyperlipidemia. Patient have no concerns at this time.  Past Medical History:  Diagnosis Date  . Ankle fracture    Right  . Anxiety   . Arthritis    hands, knees  . Asthma   . Chicken pox   . Clavicle fracture    Left  . Depression   . Foot fracture, left   . GERD (gastroesophageal reflux disease)   . Hyperlipidemia   . Seasonal allergies   . Uterine polyp    ablation    Past Surgical History:  Procedure Laterality Date  . BIOSPY     HEAD  . CESAREAN SECTION    . COLONOSCOPY    . ENDOMETRIAL ABLATION    . MANDIBLE SURGERY    . TONSILLECTOMY    . TUBAL LIGATION      Family History  Problem Relation Age of Onset  . Arthritis Mother   . Breast cancer Mother 55  . Arthritis Maternal Grandmother   . Heart disease Maternal Grandmother   . Diabetes Maternal Grandmother   . Breast cancer Maternal Aunt   . Breast cancer Paternal Grandmother   . Prostate cancer Paternal Grandfather   . Colon cancer Neg Hx     Social History   Social History  . Marital status: Divorced    Spouse name: N/A  . Number of children: N/A  . Years of education: N/A   Occupational History  . Not on file.   Social History Main Topics  . Smoking status: Never Smoker  . Smokeless tobacco: Never Used  . Alcohol use No  . Drug use: No  . Sexual activity: Not on file   Other Topics Concern  . Not on file   Social History Narrative  . No narrative on file    Outpatient Medications Prior to Visit  Medication Sig Dispense Refill  . albuterol (PROVENTIL  HFA;VENTOLIN HFA) 108 (90 Base) MCG/ACT inhaler 2 puffs qid prn 1 Inhaler 2  . aspirin 81 MG tablet Take 81 mg by mouth daily. Reported on 10/11/2015    . lansoprazole (PREVACID) 30 MG capsule Take 30 mg by mouth daily at 12 noon. Reported on 10/11/2015    . Loratadine (CLARITIN PO) Take by mouth. Reported on 10/11/2015    . ALPRAZolam (XANAX) 0.25 MG tablet Take 1 tablet (0.25 mg total) by mouth at bedtime as needed. 30 tablet 1  . fenofibrate 160 MG tablet Take 1 tablet (160 mg total) by mouth daily. 30 tablet 5  . rosuvastatin (CRESTOR) 20 MG tablet Take 1 tablet (20 mg total) by mouth at bedtime. 30 tablet 5  . Triamcinolone Acetonide (TRIAMCINOLONE 0.1 % CREAM : EUCERIN) CREA Apply 1 application topically 3 (three) times daily as needed for rash or itching. 1 each 3  . naproxen (NAPROSYN) 500 MG tablet Take 1 tablet (500 mg total) by mouth 2 (two) times daily as needed. (Patient not taking: Reported on 07/17/2016) 60 tablet 1   No facility-administered medications prior to visit.  Allergies  Allergen Reactions  . Phenylephrine-Guaifenesin Rash    Review of Systems  Constitutional: Negative for fever.  HENT: Negative for congestion.   Eyes: Negative for blurred vision.  Respiratory: Negative for cough.   Cardiovascular: Negative for chest pain and palpitations.  Gastrointestinal: Negative for vomiting.  Musculoskeletal: Negative for back pain.  Skin: Negative for rash.  Neurological: Negative for loss of consciousness and headaches.       Objective:    Physical Exam  Constitutional: She is oriented to person, place, and time. She appears well-developed and well-nourished. No distress.  HENT:  Head: Normocephalic and atraumatic.  Eyes: Conjunctivae are normal. Pupils are equal, round, and reactive to light.  Neck: Normal range of motion. No thyromegaly present.  Cardiovascular: Normal rate and regular rhythm.   Pulmonary/Chest: Effort normal and breath sounds normal. She has no  wheezes.  Abdominal: Soft. Bowel sounds are normal. There is no tenderness.  Musculoskeletal: Normal range of motion. She exhibits no edema or deformity.  Neurological: She is alert and oriented to person, place, and time.  Skin: Skin is warm and dry. She is not diaphoretic.  Psychiatric: She has a normal mood and affect.    BP 128/78 (BP Location: Right Arm, Patient Position: Sitting, Cuff Size: Large)   Pulse 81   Temp 97.8 F (36.6 C) (Oral)   Resp 16   Ht 5\' 2"  (1.575 m)   Wt 241 lb (109.3 kg)   SpO2 95%   BMI 44.08 kg/m  Wt Readings from Last 3 Encounters:  07/17/16 241 lb (109.3 kg)  04/08/16 234 lb (106.1 kg)  03/31/16 234 lb (106.1 kg)     Lab Results  Component Value Date   WBC 8.2 06/25/2015   HGB 12.7 06/25/2015   HCT 39.4 06/25/2015   PLT 272.0 06/25/2015   GLUCOSE 81 01/15/2016   CHOL 145 01/15/2016   TRIG 43.0 01/15/2016   HDL 58.40 01/15/2016   LDLDIRECT 155.0 06/25/2015   LDLCALC 78 01/15/2016   ALT 20 01/15/2016   AST 19 01/15/2016   NA 141 01/15/2016   K 5.0 01/15/2016   CL 104 01/15/2016   CREATININE 0.87 01/15/2016   BUN 23 01/15/2016   CO2 31 01/15/2016   TSH 1.10 06/25/2015    Lab Results  Component Value Date   TSH 1.10 06/25/2015   Lab Results  Component Value Date   WBC 8.2 06/25/2015   HGB 12.7 06/25/2015   HCT 39.4 06/25/2015   MCV 88.8 06/25/2015   PLT 272.0 06/25/2015   Lab Results  Component Value Date   NA 141 01/15/2016   K 5.0 01/15/2016   CO2 31 01/15/2016   GLUCOSE 81 01/15/2016   BUN 23 01/15/2016   CREATININE 0.87 01/15/2016   BILITOT 0.4 01/15/2016   ALKPHOS 64 01/15/2016   AST 19 01/15/2016   ALT 20 01/15/2016   PROT 7.1 01/15/2016   ALBUMIN 3.9 01/15/2016   CALCIUM 9.5 01/15/2016   GFR 70.46 01/15/2016   Lab Results  Component Value Date   CHOL 145 01/15/2016   Lab Results  Component Value Date   HDL 58.40 01/15/2016   Lab Results  Component Value Date   LDLCALC 78 01/15/2016   Lab Results   Component Value Date   TRIG 43.0 01/15/2016   Lab Results  Component Value Date   CHOLHDL 2 01/15/2016   No results found for: HGBA1C     Assessment & Plan:   Problem List Items Addressed This  Visit      Unprioritized   Anxiety - Primary   Relevant Medications   ALPRAZolam (XANAX) 0.25 MG tablet   Osteoarthritis of both knees   Relevant Medications   diclofenac (VOLTAREN) 75 MG EC tablet   Morbid obesity (HCC)   Relevant Medications   Liraglutide -Weight Management (SAXENDA) 18 MG/3ML SOPN   HLD (hyperlipidemia)    Check labs  con't meds      Relevant Medications   rosuvastatin (CRESTOR) 20 MG tablet   fenofibrate 160 MG tablet    Other Visit Diagnoses    Hyperlipidemia LDL goal <100       Relevant Medications   rosuvastatin (CRESTOR) 20 MG tablet   fenofibrate 160 MG tablet   Eczema, unspecified type       Relevant Medications   Triamcinolone Acetonide (TRIAMCINOLONE 0.1 % CREAM : EUCERIN) CREA   Generalized anxiety disorder       Relevant Medications   ALPRAZolam (XANAX) 0.25 MG tablet   Knee effusion, left       Relevant Medications   ALPRAZolam (XANAX) 0.25 MG tablet   Need for diphtheria-tetanus-pertussis (Tdap) vaccine       Relevant Orders   Tdap vaccine greater than or equal to 7yo IM      I am having Ms. Athanas start on Liraglutide -Weight Management. I am also having her maintain her lansoprazole, albuterol, aspirin, Loratadine (CLARITIN PO), naproxen, diclofenac, rosuvastatin, fenofibrate, triamcinolone 0.1 % cream : eucerin, and ALPRAZolam.  Meds ordered this encounter  Medications  . diclofenac (VOLTAREN) 75 MG EC tablet    Sig: Take 75 mg by mouth 2 (two) times daily.  . rosuvastatin (CRESTOR) 20 MG tablet    Sig: Take 1 tablet (20 mg total) by mouth at bedtime.    Dispense:  30 tablet    Refill:  5  . fenofibrate 160 MG tablet    Sig: Take 1 tablet (160 mg total) by mouth daily.    Dispense:  30 tablet    Refill:  5  .  Triamcinolone Acetonide (TRIAMCINOLONE 0.1 % CREAM : EUCERIN) CREA    Sig: Apply 1 application topically 3 (three) times daily as needed for rash or itching.    Dispense:  1 each    Refill:  3  . ALPRAZolam (XANAX) 0.25 MG tablet    Sig: Take 1 tablet (0.25 mg total) by mouth at bedtime as needed.    Dispense:  30 tablet    Refill:  1  . Liraglutide -Weight Management (SAXENDA) 18 MG/3ML SOPN    Sig: Inject 3 mg into the skin once.    Dispense:  3 mL    Refill:  5    CMA served as scribe during this visit. History, Physical and Plan performed by medical provider. Documentation and orders reviewed and attested to.   Ann Held, DO Patient ID: Layken Rosero, female   DOB: 03-17-1956, 61 y.o.   MRN: [id number]

## 2016-07-17 NOTE — Patient Instructions (Signed)
Generalized Anxiety Disorder Generalized anxiety disorder (GAD) is a mental disorder. It interferes with life functions, including relationships, work, and school. GAD is different from normal anxiety, which everyone experiences at some point in their lives in response to specific life events and activities. Normal anxiety actually helps us prepare for and get through these life events and activities. Normal anxiety goes away after the event or activity is over.  GAD causes anxiety that is not necessarily related to specific events or activities. It also causes excess anxiety in proportion to specific events or activities. The anxiety associated with GAD is also difficult to control. GAD can vary from mild to severe. People with severe GAD can have intense waves of anxiety with physical symptoms (panic attacks).  SYMPTOMS The anxiety and worry associated with GAD are difficult to control. This anxiety and worry are related to many life events and activities and also occur more days than not for 6 months or longer. People with GAD also have three or more of the following symptoms (one or more in children):  Restlessness.   Fatigue.  Difficulty concentrating.   Irritability.  Muscle tension.  Difficulty sleeping or unsatisfying sleep. DIAGNOSIS GAD is diagnosed through an assessment by your health care provider. Your health care provider will ask you questions aboutyour mood,physical symptoms, and events in your life. Your health care provider may ask you about your medical history and use of alcohol or drugs, including prescription medicines. Your health care provider may also do a physical exam and blood tests. Certain medical conditions and the use of certain substances can cause symptoms similar to those associated with GAD. Your health care provider may refer you to a mental health specialist for further evaluation. TREATMENT The following therapies are usually used to treat GAD:    Medication. Antidepressant medication usually is prescribed for long-term daily control. Antianxiety medicines may be added in severe cases, especially when panic attacks occur.   Talk therapy (psychotherapy). Certain types of talk therapy can be helpful in treating GAD by providing support, education, and guidance. A form of talk therapy called cognitive behavioral therapy can teach you healthy ways to think about and react to daily life events and activities.  Stress managementtechniques. These include yoga, meditation, and exercise and can be very helpful when they are practiced regularly. A mental health specialist can help determine which treatment is best for you. Some people see improvement with one therapy. However, other people require a combination of therapies. This information is not intended to replace advice given to you by your health care provider. Make sure you discuss any questions you have with your health care provider. Document Released: 09/20/2012 Document Revised: 06/16/2014 Document Reviewed: 09/20/2012 Elsevier Interactive Patient Education  2017 [employer]  

## 2016-07-17 NOTE — Assessment & Plan Note (Signed)
Check labs con't meds 

## 2016-07-17 NOTE — Progress Notes (Signed)
Pre visit review using our clinic review tool, if applicable. No additional management support is needed unless otherwise documented below in the visit note. 

## 2016-07-22 ENCOUNTER — Ambulatory Visit (HOSPITAL_BASED_OUTPATIENT_CLINIC_OR_DEPARTMENT_OTHER)
Admission: RE | Admit: 2016-07-22 | Discharge: 2016-07-22 | Disposition: A | Discharge status: Home / Self Care | Payer: Federal, State, Local not specified - PPO | Source: Ambulatory Visit | Attending: Family Medicine | Admitting: Family Medicine

## 2016-07-22 ENCOUNTER — Encounter (HOSPITAL_BASED_OUTPATIENT_CLINIC_OR_DEPARTMENT_OTHER): Payer: Self-pay

## 2016-07-22 DIAGNOSIS — Z1231 Encounter for screening mammogram for malignant neoplasm of breast: Secondary | ICD-10-CM

## 2016-10-08 DIAGNOSIS — M545 Low back pain: Secondary | ICD-10-CM | POA: Diagnosis not present

## 2016-10-08 DIAGNOSIS — M7062 Trochanteric bursitis, left hip: Secondary | ICD-10-CM | POA: Diagnosis not present

## 2016-10-16 ENCOUNTER — Ambulatory Visit (INDEPENDENT_AMBULATORY_CARE_PROVIDER_SITE_OTHER): Payer: Federal, State, Local not specified - PPO | Admitting: Family Medicine

## 2016-10-16 ENCOUNTER — Encounter: Payer: Self-pay | Admitting: Family Medicine

## 2016-10-16 VITALS — BP 125/84 | HR 87 | Temp 98.3°F | Resp 16 | Ht 62.0 in | Wt 243.4 lb

## 2016-10-16 DIAGNOSIS — Z0000 Encounter for general adult medical examination without abnormal findings: Secondary | ICD-10-CM | POA: Diagnosis not present

## 2016-10-16 DIAGNOSIS — E785 Hyperlipidemia, unspecified: Secondary | ICD-10-CM

## 2016-10-16 DIAGNOSIS — F419 Anxiety disorder, unspecified: Secondary | ICD-10-CM | POA: Diagnosis not present

## 2016-10-16 NOTE — Assessment & Plan Note (Signed)
D/w pt weight loss probram and exercise

## 2016-10-16 NOTE — Assessment & Plan Note (Signed)
A little worse lately but will improve even more when her son and daughter in law with grandchildren move out

## 2016-10-16 NOTE — Assessment & Plan Note (Signed)
Tolerating statin, encouraged heart healthy diet, avoid trans fats, minimize simple carbs and saturated fats. Increase exercise as tolerated 

## 2016-10-16 NOTE — Patient Instructions (Signed)

## 2016-10-16 NOTE — Progress Notes (Signed)
Subjective:    I acted as a Education administrator for Dr. Carollee Herter.  Guerry Bruin, CMA   Tina Hinton is a 61 y.o. female and is here for a comprehensive physical exam. The patient reports no problems.  Social History   Social History  . Marital status: Divorced    Spouse name: N/A  . Number of children: N/A  . Years of education: N/A   Occupational History  . Not on file.   Social History Main Topics  . Smoking status: Never Smoker  . Smokeless tobacco: Never Used  . Alcohol use No  . Drug use: No  . Sexual activity: Not on file   Other Topics Concern  . Not on file   Social History Narrative   No regular exercise.   Health Maintenance  Topic Date Due  . PAP SMEAR  06/21/1976  . INFLUENZA VACCINE  01/07/2017  . MAMMOGRAM  07/22/2018  . COLONOSCOPY  09/13/2025  . TETANUS/TDAP  07/17/2026  . Hepatitis C Screening  Completed  . HIV Screening  Completed    The following portions of the patient's history were reviewed and updated as appropriate:  She  has a past medical history of Ankle fracture; Anxiety; Arthritis; Asthma; Chicken pox; Clavicle fracture; Depression; Foot fracture, left; GERD (gastroesophageal reflux disease); Hyperlipidemia; Seasonal allergies; and Uterine polyp. She  does not have any pertinent problems on file. She  has a past surgical history that includes Tonsillectomy; Mandible surgery; Cesarean section; Tubal ligation; Endometrial ablation; Colonoscopy; and BIOSPY. Her family history includes Arthritis in her maternal grandmother and mother; Breast cancer in her maternal aunt and paternal grandmother; Breast cancer (age of onset: 20) in her mother; Diabetes in her maternal grandmother; Heart disease in her maternal grandmother; Prostate cancer in her paternal grandfather. She  reports that she has never smoked. She has never used smokeless tobacco. She reports that she does not drink alcohol or use drugs. She has a current medication list which includes the  following prescription(s): albuterol, alprazolam, aspirin, cyclobenzaprine, diclofenac, fenofibrate, hydrocodone-acetaminophen, lansoprazole, loratadine, rosuvastatin, and triamcinolone 0.1 % cream : eucerin. Current Outpatient Prescriptions on File Prior to Visit  Medication Sig Dispense Refill  . albuterol (PROVENTIL HFA;VENTOLIN HFA) 108 (90 Base) MCG/ACT inhaler 2 puffs qid prn 1 Inhaler 2  . ALPRAZolam (XANAX) 0.25 MG tablet Take 1 tablet (0.25 mg total) by mouth at bedtime as needed. 30 tablet 1  . aspirin 81 MG tablet Take 81 mg by mouth daily. Reported on 10/11/2015    . diclofenac (VOLTAREN) 75 MG EC tablet Take 75 mg by mouth 2 (two) times daily.    . fenofibrate 160 MG tablet TAKE 1 TABLET (160 MG TOTAL) BY MOUTH DAILY. 90 tablet 1  . lansoprazole (PREVACID) 30 MG capsule Take 30 mg by mouth daily at 12 noon. Reported on 10/11/2015    . Loratadine (CLARITIN PO) Take by mouth. Reported on 10/11/2015    . rosuvastatin (CRESTOR) 20 MG tablet TAKE 1 TABLET (20 MG TOTAL) BY MOUTH AT BEDTIME. 90 tablet 1  . Triamcinolone Acetonide (TRIAMCINOLONE 0.1 % CREAM : EUCERIN) CREA Apply 1 application topically 3 (three) times daily as needed for rash or itching. 1 each 3   No current facility-administered medications on file prior to visit.    She is allergic to phenylephrine-guaifenesin..  Review of Systems Review of Systems  Constitutional: Negative for activity change, appetite change and fatigue.  HENT: Negative for hearing loss, congestion, tinnitus and ear discharge.  dentist q62m Eyes: Negative for  visual disturbance (see optho q1y -- vision corrected to 20/20 with glasses).  Respiratory: Negative for cough, chest tightness and shortness of breath.   Cardiovascular: Negative for chest pain, palpitations and leg swelling.  Gastrointestinal: Negative for abdominal pain, diarrhea, constipation and abdominal distention.  Genitourinary: Negative for urgency, frequency, decreased urine volume and  difficulty urinating.  Musculoskeletal: Negative for back pain, arthralgias and gait problem.  Skin: Negative for color change, pallor and rash.  Neurological: Negative for dizziness, light-headedness, numbness and headaches.  Hematological: Negative for adenopathy. Does not bruise/bleed easily.  Psychiatric/Behavioral: Negative for suicidal ideas, confusion, sleep disturbance, self-injury, dysphoric mood, decreased concentration and agitation.       Objective:    BP 125/84 (BP Location: Right Arm, Cuff Size: Large)   Pulse 87   Temp 98.3 F (36.8 C) (Oral)   Resp 16   Ht 5\' 2"  (1.575 m)   Wt 243 lb 6.4 oz (110.4 kg)   SpO2 99%   BMI 44.52 kg/m  General appearance: alert, cooperative, appears stated age and no distress Head: Normocephalic, without obvious abnormality, atraumatic Eyes: conjunctivae/corneas clear. PERRL, EOM's intact. Fundi benign. Ears: normal TM's and external ear canals both ears Nose: Nares normal. Septum midline. Mucosa normal. No drainage or sinus tenderness. Throat: lips, mucosa, and tongue normal; teeth and gums normal Neck: no adenopathy, no carotid bruit, no JVD, supple, symmetrical, trachea midline and thyroid not enlarged, symmetric, no tenderness/mass/nodules Back: symmetric, no curvature. ROM normal. No CVA tenderness. Lungs: clear to auscultation bilaterally Breasts: normal appearance, no masses or tenderness Heart: regular rate and rhythm, S1, S2 normal, no murmur, click, rub or gallop Abdomen: soft, non-tender; bowel sounds normal; no masses,  no organomegaly Pelvic: deferred Extremities: extremities normal, atraumatic, no cyanosis or edema Pulses: 2+ and symmetric Skin: Skin color, texture, turgor normal. No rashes or lesions Lymph nodes: Cervical, supraclavicular, and axillary nodes normal. Neurologic: Alert and oriented X 3, normal strength and tone. Normal symmetric reflexes. Normal coordination and gait    Assessment:    Healthy female  exam.      Plan:    ghm utd Check labs See After Visit Summary for Counseling Recommendations    1. Preventative health care See above - CBC with Differential/Platelet; Future - Lipid panel; Future - Comprehensive metabolic panel; Future - TSH; Future - POCT Urinalysis Dipstick (Automated); Future  2. Hyperlipidemia, unspecified hyperlipidemia type Tolerating statin, encouraged heart healthy diet, avoid trans fats, minimize simple carbs and saturated fats. Increase exercise as tolerated - Lipid panel; Future - Comprehensive metabolic panel; Future  3. Anxiety stable  4. Morbid obesity ([place]) D/w diet and exercise Info for weight loss program upstairs given to pt

## 2016-11-05 DIAGNOSIS — N390 Urinary tract infection, site not specified: Secondary | ICD-10-CM | POA: Diagnosis not present

## 2016-11-05 DIAGNOSIS — R197 Diarrhea, unspecified: Secondary | ICD-10-CM | POA: Diagnosis not present

## 2016-11-05 DIAGNOSIS — R1011 Right upper quadrant pain: Secondary | ICD-10-CM | POA: Diagnosis not present

## 2016-11-05 DIAGNOSIS — K5790 Diverticulosis of intestine, part unspecified, without perforation or abscess without bleeding: Secondary | ICD-10-CM | POA: Diagnosis not present

## 2016-11-05 DIAGNOSIS — K529 Noninfective gastroenteritis and colitis, unspecified: Secondary | ICD-10-CM | POA: Diagnosis not present

## 2016-11-05 DIAGNOSIS — E860 Dehydration: Secondary | ICD-10-CM | POA: Diagnosis not present

## 2016-11-05 DIAGNOSIS — R110 Nausea: Secondary | ICD-10-CM | POA: Diagnosis not present

## 2016-11-05 DIAGNOSIS — E785 Hyperlipidemia, unspecified: Secondary | ICD-10-CM | POA: Diagnosis not present

## 2016-11-05 DIAGNOSIS — I700 Atherosclerosis of aorta: Secondary | ICD-10-CM | POA: Diagnosis not present

## 2016-11-05 DIAGNOSIS — R109 Unspecified abdominal pain: Secondary | ICD-10-CM | POA: Diagnosis not present

## 2016-11-05 DIAGNOSIS — K760 Fatty (change of) liver, not elsewhere classified: Secondary | ICD-10-CM | POA: Diagnosis not present

## 2016-11-24 DIAGNOSIS — R197 Diarrhea, unspecified: Secondary | ICD-10-CM | POA: Diagnosis not present

## 2016-11-24 DIAGNOSIS — R109 Unspecified abdominal pain: Secondary | ICD-10-CM | POA: Diagnosis not present

## 2016-11-26 ENCOUNTER — Ambulatory Visit (INDEPENDENT_AMBULATORY_CARE_PROVIDER_SITE_OTHER): Payer: Federal, State, Local not specified - PPO | Admitting: Family Medicine

## 2016-11-26 ENCOUNTER — Encounter: Payer: Self-pay | Admitting: Family Medicine

## 2016-11-26 VITALS — BP 122/82 | HR 94 | Temp 97.8°F | Ht 61.5 in | Wt 240.2 lb

## 2016-11-26 DIAGNOSIS — R1084 Generalized abdominal pain: Secondary | ICD-10-CM | POA: Diagnosis not present

## 2016-11-26 DIAGNOSIS — R197 Diarrhea, unspecified: Secondary | ICD-10-CM

## 2016-11-26 MED ORDER — DICYCLOMINE HCL 10 MG PO CAPS: 10.0000 mg | ORAL_CAPSULE | Freq: Three times a day (TID) | ORAL | 0 refills | Status: DC

## 2016-11-26 NOTE — Progress Notes (Signed)
Pre visit review using our clinic review tool, if applicable. No additional management support is needed unless otherwise documented below in the visit note. 

## 2016-11-26 NOTE — Patient Instructions (Signed)
Drop off your sample during business hours.  If this is neg, follow up or call and we will get you to a GI specialist.   Let us know if you need anything.  Cut your bentyl tabs in half or use new dose that was called in.

## 2016-11-26 NOTE — Progress Notes (Signed)
Chief Complaint  Patient presents with  . Diarrhea    Tina Hinton is here for Diffuse crampy abdominal pain.  Duration: 4 days; She had this 2 weeks ago and went to the emergency department at [facility]. She received a CT of her abdomen/pelvis that did not show any acute abnormalities. She was also given a prophylactic course of ciprofloxacin. She did improve for several days before this episode started 4 days ago. Nighttime awakenings? No Bleeding? Yes Weight loss? No Palliation: Bentyl 20 mg TID, made her drowsy Provocation: Eating Associated symptoms: diarrhea, some bloody and mucus Denies: fever, nausea and vomiting Treatment to date: Bentyl, Cipro  ROS: Constitutional: No fevers GI: No N/V/, no bleeding + pain  Past Medical History:  Diagnosis Date  . Ankle fracture    Right  . Anxiety   . Arthritis    hands, knees  . Asthma   . Chicken pox   . Clavicle fracture    Left  . Depression   . Foot fracture, left   . GERD (gastroesophageal reflux disease)   . Hyperlipidemia   . Seasonal allergies   . Uterine polyp    ablation   Family History  Problem Relation Age of Onset  . Arthritis Mother   . Breast cancer Mother 36  . Arthritis Maternal Grandmother   . Heart disease Maternal Grandmother   . Diabetes Maternal Grandmother   . Breast cancer Maternal Aunt   . Breast cancer Paternal Grandmother   . Prostate cancer Paternal Grandfather   . Colon cancer Neg Hx    Past Surgical History:  Procedure Laterality Date  . BIOSPY     HEAD  . CESAREAN SECTION    . COLONOSCOPY    . ENDOMETRIAL ABLATION    . MANDIBLE SURGERY    . TONSILLECTOMY    . TUBAL LIGATION      BP 122/82 (BP Location: Left Arm, Patient Position: Sitting, Cuff Size: Large)   Pulse 94   Temp 97.8 F (36.6 C) (Oral)   Ht 5' 1.5" (1.562 m)   Wt 240 lb 4 oz (109 kg)   SpO2 97%   BMI 44.66 kg/m  Gen.: Awake, alert, appears stated age 16: Mucous membranes moist  without mucosal lesions Heart: Regular rate and rhythm without murmurs Lungs: Clear auscultation bilaterally, no rales or wheezing, normal effort without accessory muscle use. Abdomen: Bowel sounds are present. Abdomen is soft, Diffusely tender to palpation, nondistended, no masses or organomegaly. Negative Murphy's, Rovsing's, McBurney's, and Carnett's sign. Psych: Age appropriate judgment and insight. Normal mood and affect.  Generalized abdominal pain - Plan: dicyclomine (BENTYL) 10 MG capsule  Diarrhea, unspecified type - Plan: Stool Culture, Ova and parasite examination  Orders as above. Decrease dose of Bentyl from 20 mg to 10 mg given her drowsiness associated with it. Letter for work given. Will fully culture her stool. If the stool cultures negative, will refer her to GI with the intent of having a colonoscopy to rule out colitis versus inflammatory changes. F/u pending the above. Pt voiced understanding and agreement to the plan.  [place], DO 11/26/16 4:03 PM

## 2016-11-27 DIAGNOSIS — R197 Diarrhea, unspecified: Secondary | ICD-10-CM | POA: Diagnosis not present

## 2016-11-28 LAB — OVA AND PARASITE EXAMINATION: OP: NONE SEEN

## 2016-12-01 ENCOUNTER — Other Ambulatory Visit: Payer: Self-pay | Admitting: Family Medicine

## 2016-12-01 DIAGNOSIS — R197 Diarrhea, unspecified: Secondary | ICD-10-CM

## 2016-12-01 LAB — STOOL CULTURE

## 2016-12-01 NOTE — Progress Notes (Signed)
Stool culture neg. Refer to GI per plan for blood and mucus in diarrhea. Pt notified via [place].

## 2016-12-08 ENCOUNTER — Other Ambulatory Visit: Payer: Self-pay | Admitting: Family Medicine

## 2016-12-08 DIAGNOSIS — M170 Bilateral primary osteoarthritis of knee: Secondary | ICD-10-CM

## 2016-12-08 DIAGNOSIS — R1084 Generalized abdominal pain: Secondary | ICD-10-CM

## 2017-01-05 DIAGNOSIS — Z7182 Exercise counseling: Secondary | ICD-10-CM | POA: Diagnosis not present

## 2017-01-05 DIAGNOSIS — E785 Hyperlipidemia, unspecified: Secondary | ICD-10-CM | POA: Diagnosis not present

## 2017-01-05 DIAGNOSIS — I700 Atherosclerosis of aorta: Secondary | ICD-10-CM | POA: Diagnosis not present

## 2017-01-05 DIAGNOSIS — R1012 Left upper quadrant pain: Secondary | ICD-10-CM | POA: Diagnosis not present

## 2017-01-05 DIAGNOSIS — R1030 Lower abdominal pain, unspecified: Secondary | ICD-10-CM | POA: Diagnosis not present

## 2017-01-05 DIAGNOSIS — R1310 Dysphagia, unspecified: Secondary | ICD-10-CM | POA: Diagnosis not present

## 2017-01-05 DIAGNOSIS — R197 Diarrhea, unspecified: Secondary | ICD-10-CM | POA: Diagnosis not present

## 2017-01-05 DIAGNOSIS — K449 Diaphragmatic hernia without obstruction or gangrene: Secondary | ICD-10-CM | POA: Diagnosis not present

## 2017-01-05 DIAGNOSIS — R112 Nausea with vomiting, unspecified: Secondary | ICD-10-CM | POA: Diagnosis not present

## 2017-01-05 DIAGNOSIS — Z713 Dietary counseling and surveillance: Secondary | ICD-10-CM | POA: Diagnosis not present

## 2017-01-05 DIAGNOSIS — K2970 Gastritis, unspecified, without bleeding: Secondary | ICD-10-CM | POA: Diagnosis not present

## 2017-01-05 DIAGNOSIS — Z79891 Long term (current) use of opiate analgesic: Secondary | ICD-10-CM | POA: Diagnosis not present

## 2017-01-05 DIAGNOSIS — K219 Gastro-esophageal reflux disease without esophagitis: Secondary | ICD-10-CM | POA: Diagnosis not present

## 2017-01-05 DIAGNOSIS — R1032 Left lower quadrant pain: Secondary | ICD-10-CM | POA: Diagnosis not present

## 2017-01-05 DIAGNOSIS — K5730 Diverticulosis of large intestine without perforation or abscess without bleeding: Secondary | ICD-10-CM | POA: Diagnosis not present

## 2017-01-05 DIAGNOSIS — K222 Esophageal obstruction: Secondary | ICD-10-CM | POA: Diagnosis not present

## 2017-01-05 DIAGNOSIS — R109 Unspecified abdominal pain: Secondary | ICD-10-CM | POA: Diagnosis not present

## 2017-01-05 DIAGNOSIS — R1013 Epigastric pain: Secondary | ICD-10-CM | POA: Diagnosis not present

## 2017-01-05 DIAGNOSIS — K580 Irritable bowel syndrome with diarrhea: Secondary | ICD-10-CM | POA: Diagnosis not present

## 2017-01-05 DIAGNOSIS — Z6841 Body Mass Index (BMI) 40.0 and over, adult: Secondary | ICD-10-CM | POA: Diagnosis not present

## 2017-01-05 DIAGNOSIS — K760 Fatty (change of) liver, not elsewhere classified: Secondary | ICD-10-CM | POA: Diagnosis not present

## 2017-01-05 DIAGNOSIS — K859 Acute pancreatitis without necrosis or infection, unspecified: Secondary | ICD-10-CM | POA: Diagnosis not present

## 2017-01-06 DIAGNOSIS — R1030 Lower abdominal pain, unspecified: Secondary | ICD-10-CM | POA: Diagnosis not present

## 2017-01-06 DIAGNOSIS — R1012 Left upper quadrant pain: Secondary | ICD-10-CM | POA: Diagnosis not present

## 2017-01-06 DIAGNOSIS — R112 Nausea with vomiting, unspecified: Secondary | ICD-10-CM | POA: Diagnosis not present

## 2017-01-06 DIAGNOSIS — Z6841 Body Mass Index (BMI) 40.0 and over, adult: Secondary | ICD-10-CM | POA: Diagnosis not present

## 2017-01-06 DIAGNOSIS — R1013 Epigastric pain: Secondary | ICD-10-CM | POA: Diagnosis not present

## 2017-01-06 DIAGNOSIS — R197 Diarrhea, unspecified: Secondary | ICD-10-CM | POA: Diagnosis not present

## 2017-01-07 DIAGNOSIS — Z6841 Body Mass Index (BMI) 40.0 and over, adult: Secondary | ICD-10-CM | POA: Diagnosis not present

## 2017-01-07 DIAGNOSIS — K449 Diaphragmatic hernia without obstruction or gangrene: Secondary | ICD-10-CM | POA: Diagnosis not present

## 2017-01-07 DIAGNOSIS — R197 Diarrhea, unspecified: Secondary | ICD-10-CM | POA: Diagnosis not present

## 2017-01-07 DIAGNOSIS — R109 Unspecified abdominal pain: Secondary | ICD-10-CM | POA: Diagnosis not present

## 2017-01-07 DIAGNOSIS — R1310 Dysphagia, unspecified: Secondary | ICD-10-CM | POA: Diagnosis not present

## 2017-01-07 DIAGNOSIS — K222 Esophageal obstruction: Secondary | ICD-10-CM | POA: Diagnosis not present

## 2017-01-07 DIAGNOSIS — K2970 Gastritis, unspecified, without bleeding: Secondary | ICD-10-CM | POA: Diagnosis not present

## 2017-01-07 DIAGNOSIS — R112 Nausea with vomiting, unspecified: Secondary | ICD-10-CM | POA: Diagnosis not present

## 2017-01-09 ENCOUNTER — Other Ambulatory Visit: Payer: Self-pay | Admitting: Family Medicine

## 2017-01-09 DIAGNOSIS — E785 Hyperlipidemia, unspecified: Secondary | ICD-10-CM

## 2017-01-19 ENCOUNTER — Encounter: Payer: Self-pay | Admitting: Family Medicine

## 2017-01-19 ENCOUNTER — Ambulatory Visit (INDEPENDENT_AMBULATORY_CARE_PROVIDER_SITE_OTHER): Payer: Federal, State, Local not specified - PPO | Admitting: Family Medicine

## 2017-01-19 VITALS — BP 108/68 | HR 83 | Temp 97.9°F | Ht 61.5 in | Wt 238.4 lb

## 2017-01-19 DIAGNOSIS — K589 Irritable bowel syndrome without diarrhea: Secondary | ICD-10-CM | POA: Insufficient documentation

## 2017-01-19 DIAGNOSIS — E785 Hyperlipidemia, unspecified: Secondary | ICD-10-CM

## 2017-01-19 DIAGNOSIS — R197 Diarrhea, unspecified: Secondary | ICD-10-CM | POA: Diagnosis not present

## 2017-01-19 DIAGNOSIS — M170 Bilateral primary osteoarthritis of knee: Secondary | ICD-10-CM | POA: Diagnosis not present

## 2017-01-19 HISTORY — DX: Irritable bowel syndrome, unspecified: K58.9

## 2017-01-19 MED ORDER — DICLOFENAC SODIUM 1 % TD GEL: 4.0000 g | Freq: Four times a day (QID) | TRANSDERMAL | 5 refills | Status: DC

## 2017-01-19 NOTE — Assessment & Plan Note (Signed)
Pt has GI f/u  Check thyroid  con't bentyl

## 2017-01-19 NOTE — Progress Notes (Signed)
Patient ID: Tina Hinton, female    DOB: 02/02/1956  Age: 61 y.o. MRN: [id number]    Subjective:  Subjective  HPI Tina Hinton presents for hospital f/u for diarrhea.  Pt had labs and CT abd and everything was normal.   Pt went to er with abd pain and vomiting -- she was d/c with bentyl and f/u with GI is on Thursday.    Review of Systems  Constitutional: Negative for appetite change, diaphoresis, fatigue and unexpected weight change.  Eyes: Negative for pain, redness and visual disturbance.  Respiratory: Negative for cough, chest tightness, shortness of breath and wheezing.   Cardiovascular: Negative for chest pain, palpitations and leg swelling.  Gastrointestinal: Positive for abdominal pain and diarrhea. Negative for abdominal distention, anal bleeding, blood in stool, constipation, nausea, rectal pain and vomiting.  Endocrine: Negative for cold intolerance, heat intolerance, polydipsia, polyphagia and polyuria.  Genitourinary: Negative for difficulty urinating, dysuria and frequency.  Neurological: Negative for dizziness, light-headedness, numbness and headaches.    History Past Medical History:  Diagnosis Date  . Ankle fracture    Right  . Anxiety   . Arthritis    hands, knees  . Asthma   . Chicken pox   . Clavicle fracture    Left  . Depression   . Foot fracture, left   . GERD (gastroesophageal reflux disease)   . Hyperlipidemia   . Seasonal allergies   . Uterine polyp    ablation    She has a past surgical history that includes Tonsillectomy; Mandible surgery; Cesarean section; Tubal ligation; Endometrial ablation; Colonoscopy; and BIOSPY.   Her family history includes Arthritis in her maternal grandmother and mother; Breast cancer in her maternal aunt and paternal grandmother; Breast cancer (age of onset: 27) in her mother; Diabetes in her maternal grandmother; Heart disease in her maternal grandmother; Prostate cancer in her paternal grandfather.She  reports that she has never smoked. She has never used smokeless tobacco. She reports that she does not drink alcohol or use drugs.  Current Outpatient Prescriptions on File Prior to Visit  Medication Sig Dispense Refill  . albuterol (PROVENTIL HFA;VENTOLIN HFA) 108 (90 Base) MCG/ACT inhaler 2 puffs qid prn 1 Inhaler 2  . ALPRAZolam (XANAX) 0.25 MG tablet Take 1 tablet (0.25 mg total) by mouth at bedtime as needed. 30 tablet 1  . dicyclomine (BENTYL) 10 MG capsule TAKE 1 CAPSULE (10 MG TOTAL) BY MOUTH 4 (FOUR) TIMES DAILY - BEFORE MEALS AND AT BEDTIME. 60 capsule 0  . fenofibrate 160 MG tablet TAKE 1 TABLET (160 MG TOTAL) BY MOUTH DAILY. 90 tablet 1  . Loratadine (CLARITIN PO) Take by mouth. Reported on 10/11/2015    . rosuvastatin (CRESTOR) 20 MG tablet TAKE 1 TABLET (20 MG TOTAL) BY MOUTH AT BEDTIME. 90 tablet 1  . Triamcinolone Acetonide (TRIAMCINOLONE 0.1 % CREAM : EUCERIN) CREA Apply 1 application topically 3 (three) times daily as needed for rash or itching. 1 each 3  . aspirin 81 MG tablet Take 81 mg by mouth daily. Reported on 10/11/2015    . lansoprazole (PREVACID) 30 MG capsule Take 30 mg by mouth daily at 12 noon. Reported on 10/11/2015     No current facility-administered medications on file prior to visit.      Objective:  Objective  Physical Exam  Constitutional: She is oriented to person, place, and time. She appears well-developed and well-nourished.  HENT:  Head: Normocephalic and atraumatic.  Eyes: Conjunctivae and EOM are normal.  Neck: Normal range  of motion. Neck supple. No JVD present. Carotid bruit is not present. No thyromegaly present.  Cardiovascular: Normal rate, regular rhythm and normal heart sounds.   No murmur heard. Pulmonary/Chest: Effort normal and breath sounds normal. No respiratory distress. She has no wheezes. She has no rales. She exhibits no tenderness.  Abdominal: Soft. There is tenderness. There is no rebound and no guarding.    Musculoskeletal: She  exhibits no edema.  Neurological: She is alert and oriented to person, place, and time.  Psychiatric: She has a normal mood and affect.  Nursing note and vitals reviewed.  BP 108/68 (BP Location: Left Arm, Patient Position: Sitting, Cuff Size: Large)   Pulse 83   Temp 97.9 F (36.6 C) (Oral)   Ht 5' 1.5" (1.562 m)   Wt 238 lb 6 oz (108.1 kg)   SpO2 98%   BMI 44.31 kg/m  Wt Readings from Last 3 Encounters:  01/19/17 238 lb 6 oz (108.1 kg)  11/26/16 240 lb 4 oz (109 kg)  10/16/16 243 lb 6.4 oz (110.4 kg)     Lab Results  Component Value Date   WBC 8.2 06/25/2015   HGB 12.7 06/25/2015   HCT 39.4 06/25/2015   PLT 272.0 06/25/2015   GLUCOSE 81 01/15/2016   CHOL 145 01/15/2016   TRIG 43.0 01/15/2016   HDL 58.40 01/15/2016   LDLDIRECT 155.0 06/25/2015   LDLCALC 78 01/15/2016   ALT 20 01/15/2016   AST 19 01/15/2016   NA 141 01/15/2016   K 5.0 01/15/2016   CL 104 01/15/2016   CREATININE 0.87 01/15/2016   BUN 23 01/15/2016   CO2 31 01/15/2016   TSH 1.10 06/25/2015    Mm Screening Breast Tomo Bilateral  Result Date: 07/23/2016 CLINICAL DATA:  Screening. EXAM: 2D DIGITAL SCREENING BILATERAL MAMMOGRAM WITH CAD AND ADJUNCT TOMO COMPARISON:  Previous exam(s). ACR Breast Density Category b: There are scattered areas of fibroglandular density. FINDINGS: There are no findings suspicious for malignancy. Images were processed with CAD. IMPRESSION: No mammographic evidence of malignancy. A result letter of this screening mammogram will be mailed directly to the patient. RECOMMENDATION: Screening mammogram in one year. (Code:SM-B-01Y) BI-RADS CATEGORY  1: Negative. Electronically Signed   By: Lillia Mountain M.D.   On: 07/23/2016 08:02     Assessment & Plan:  Plan  I have discontinued Ms. Costin's diclofenac. I am also having her start on diclofenac sodium. Additionally, I am having her maintain her lansoprazole, albuterol, aspirin, Loratadine (CLARITIN PO), triamcinolone 0.1 % cream :  eucerin, ALPRAZolam, dicyclomine, rosuvastatin, and fenofibrate.  Meds ordered this encounter  Medications  . diclofenac sodium (VOLTAREN) 1 % GEL    Sig: Apply 4 g topically 4 (four) times daily.    Dispense:  100 g    Refill:  5    Problem List Items Addressed This Visit      Unprioritized   Diarrhea - Primary    Pt has GI f/u  Check thyroid  con't bentyl       Relevant Orders   TSH   T3, free   T4, free   Hyperlipidemia LDL goal <100    Tolerating statin, encouraged heart healthy diet, avoid trans fats, minimize simple carbs and saturated fats. Increase exercise as tolerated      Relevant Orders   Lipid panel   Primary osteoarthritis of both knees    Consider f/u with ortho for injections voltaren gel Pt can not take voltaren due to stomach problems  Relevant Medications   diclofenac sodium (VOLTAREN) 1 % GEL      Follow-up: Return if symptoms worsen or fail to improve.  Ann Held, DO

## 2017-01-19 NOTE — Patient Instructions (Signed)
Irritable Bowel Syndrome, Adult Irritable bowel syndrome (IBS) is not one specific disease. It is a group of symptoms that affects the organs responsible for digestion (gastrointestinal or GI tract). To regulate how your GI tract works, your body sends signals back and forth between your intestines and your brain. If you have IBS, there may be a problem with these signals. As a result, your GI tract does not function normally. Your intestines may become more sensitive and overreact to certain things. This is especially true when you eat certain foods or when you are under stress. There are four types of IBS. These may be determined based on the consistency of your stool:  IBS with diarrhea.  IBS with constipation.  Mixed IBS.  Unsubtyped IBS.  It is important to know which type of IBS you have. Some treatments are more likely to be helpful for certain types of IBS. What are the causes? The exact cause of IBS is not known. What increases the risk? You may have a higher risk of IBS if:  You are a woman.  You are younger than 61 years old.  You have a family history of IBS.  You have mental health problems.  You have had bacterial infection of your GI tract.  What are the signs or symptoms? Symptoms of IBS vary from person to person. The main symptom is abdominal pain or discomfort. Additional symptoms usually include one or more of the following:  Diarrhea, constipation, or both.  Abdominal swelling or bloating.  Feeling full or sick after eating a small or regular-size meal.  Frequent gas.  Mucus in the stool.  A feeling of having more stool left after a bowel movement.  Symptoms tend to come and go. They may be associated with stress, psychiatric conditions, or nothing at all. How is this diagnosed? There is no specific test to diagnose IBS. Your health care provider will make a diagnosis based on a physical exam, medical history, and your symptoms. You may have other  tests to rule out other conditions that may be causing your symptoms. These may include:  Blood tests.  X-rays.  CT scan.  Endoscopy and colonoscopy. This is a test in which your GI tract is viewed with a long, thin, flexible tube.  How is this treated? There is no cure for IBS, but treatment can help relieve symptoms. IBS treatment often includes:  Changes to your diet, such as: ? Eating more fiber. ? Avoiding foods that cause symptoms. ? Drinking more water. ? Eating regular, medium-sized portioned meals.  Medicines. These may include: ? Fiber supplements if you have constipation. ? Medicine to control diarrhea (antidiarrheal medicines). ? Medicine to help control muscle spasms in your GI tract (antispasmodic medicines). ? Medicines to help with any mental health issues, such as antidepressants or tranquilizers.  Therapy. ? Talk therapy may help with anxiety, depression, or other mental health issues that can make IBS symptoms worse.  Stress reduction. ? Managing your stress can help keep symptoms under control.  Follow these instructions at home:  Take medicines only as directed by your health care provider.  Eat a healthy diet. ? Avoid foods and drinks with added sugar. ? Include more whole grains, fruits, and vegetables gradually into your diet. This may be especially helpful if you have IBS with constipation. ? Avoid any foods and drinks that make your symptoms worse. These may include dairy products and caffeinated or carbonated drinks. ? Do not eat large meals. ? Drink enough   fluid to keep your urine clear or pale yellow.  Exercise regularly. Ask your health care provider for recommendations of good activities for you.  Keep all follow-up visits as directed by your health care provider. This is important. Contact a health care provider if:  You have constant pain.  You have trouble or pain with swallowing.  You have worsening diarrhea. Get help right away  if:  You have severe and worsening abdominal pain.  You have diarrhea and: ? You have a rash, stiff neck, or severe headache. ? You are irritable, sleepy, or difficult to awaken. ? You are weak, dizzy, or extremely thirsty.  You have bright red blood in your stool or you have black tarry stools.  You have unusual abdominal swelling that is painful.  You vomit continuously.  You vomit blood (hematemesis).  You have both abdominal pain and a fever. This information is not intended to replace advice given to you by your health care provider. Make sure you discuss any questions you have with your health care provider. Document Released: 05/26/2005 Document Revised: 10/26/2015 Document Reviewed: 02/10/2014 Elsevier Interactive Patient Education  2018 [employer]vier Inc.  

## 2017-01-19 NOTE — Progress Notes (Signed)
Pre visit review using our clinic review tool, if applicable. No additional management support is needed unless otherwise documented below in the visit note. 

## 2017-01-19 NOTE — Assessment & Plan Note (Signed)
Tolerating statin, encouraged heart healthy diet, avoid trans fats, minimize simple carbs and saturated fats. Increase exercise as tolerated 

## 2017-01-19 NOTE — Assessment & Plan Note (Signed)
Consider f/u with ortho for injections voltaren gel Pt can not take voltaren due to stomach problems

## 2017-01-20 ENCOUNTER — Other Ambulatory Visit (INDEPENDENT_AMBULATORY_CARE_PROVIDER_SITE_OTHER): Payer: Federal, State, Local not specified - PPO

## 2017-01-20 DIAGNOSIS — R197 Diarrhea, unspecified: Secondary | ICD-10-CM | POA: Diagnosis not present

## 2017-01-20 DIAGNOSIS — E785 Hyperlipidemia, unspecified: Secondary | ICD-10-CM

## 2017-01-20 LAB — LIPID PANEL
CHOLESTEROL: 179 mg/dL (ref 0–200)
HDL: 41.9 mg/dL (ref >39.00)
LDL Cholesterol: 103 mg/dL — ABNORMAL HIGH (ref 0–99)
NonHDL: 137.45
Total CHOL/HDL Ratio: 4
Triglycerides: 171 mg/dL — ABNORMAL HIGH (ref 0.0–149.0)
VLDL: 34.2 mg/dL (ref 0.0–40.0)

## 2017-01-20 LAB — T4, FREE: FREE T4: 1.01 ng/dL (ref 0.60–1.60)

## 2017-01-20 LAB — TSH: TSH: 1.74 u[IU]/mL (ref 0.35–4.50)

## 2017-01-20 LAB — T3, FREE: T3 FREE: 3.1 pg/mL (ref 2.3–4.2)

## 2017-01-22 ENCOUNTER — Encounter: Payer: Self-pay | Admitting: Internal Medicine

## 2017-01-22 ENCOUNTER — Ambulatory Visit (INDEPENDENT_AMBULATORY_CARE_PROVIDER_SITE_OTHER): Payer: Federal, State, Local not specified - PPO | Admitting: Internal Medicine

## 2017-01-22 ENCOUNTER — Other Ambulatory Visit: Payer: Self-pay | Admitting: *Deleted

## 2017-01-22 VITALS — BP 122/70 | HR 97 | Ht 61.0 in | Wt 238.2 lb

## 2017-01-22 DIAGNOSIS — E785 Hyperlipidemia, unspecified: Secondary | ICD-10-CM

## 2017-01-22 DIAGNOSIS — R197 Diarrhea, unspecified: Secondary | ICD-10-CM

## 2017-01-22 DIAGNOSIS — R1084 Generalized abdominal pain: Secondary | ICD-10-CM | POA: Diagnosis not present

## 2017-01-22 DIAGNOSIS — K625 Hemorrhage of anus and rectum: Secondary | ICD-10-CM | POA: Diagnosis not present

## 2017-01-22 MED ORDER — DICYCLOMINE HCL 20 MG PO TABS: 20.0000 mg | ORAL_TABLET | Freq: Four times a day (QID) | ORAL | 1 refills | Status: DC | PRN

## 2017-01-22 MED ORDER — SOD PICOSULFATE-MAG OX-CIT ACD 10-3.5-12 MG-GM -GM/160ML PO SOLN: 1.0000 | Freq: Once | ORAL | 0 refills | Status: AC

## 2017-01-22 NOTE — Progress Notes (Signed)
Tina Hinton 61 y.o. [birth year] [id number]  Referred by Shelda Pal, DO [address] [place],  [id number]  For Dr. Cheri Rous  Assessment & Plan:   Encounter Diagnoses  Name Primary?  . Diarrhea, unspecified type Yes  . Rectal bleeding   . Generalized abdominal pain    2-3 mos of urgent watery diarrhea and rectal bleeding + abdominal cramps. Negative screening colonoscopy last year. New problems. ? IBS, ? IBD, ? Microscopic colitis. IBS seems most likely but having nocturnal sxs which goes against. Not infectious. Labs ok overall. Does not have celiac dz by recent EGD at [facility].  Refill dicyclomine Schedule colonoscopy The risks and benefits as well as alternatives of endoscopic procedure(s) have been discussed and reviewed. All questions answered. The patient agrees to proceed.  LD:JTTSV Chase, Alferd Apa, DO   Subjective:   Chief Complaint: diarrhea  HPI 61 yo ww w/ hx negative screening colonoscopy last year - for past 2 mos has had a lot of nausea, vomiting, genmeral;ized abdominal pain and diarrhea. Was admitted in [facility] - EGD 8/1 - dilated esophageal ring and had multiple bxs - no sprue, esophageal and gastric bxs also NL.  CT scan was negative except diverticulosis.CT and [place] in May also same findings inclusing hepatic steatosis - had same sxs then. Lipase 231 7/30 and NL next say Says upper sxs much better after EGD and dilation and PPI. Still having the diarrhea uo to several x a day - occurs pc "runs right thyough me" and sometimes in hrs after midnoight"  Dicyclomine 10-20 mg bid helsp but makes her groggy. Loperamide not much help. Does sometimes have a few days w/o defecation on regimen of bid dicyclomine then unloads w/ multiple stools. Has some rectal beleding Allergies  Allergen Reactions  . Phenylephrine-Guaifenesin Rash    Current Meds  Medication Sig  . pantoprazole (PROTONIX) 40 MG tablet Take 40 mg by mouth 2 (two)  times daily before a meal.   Past Medical History:  Diagnosis Date  . Ankle fracture    Right  . Anxiety   . Arthritis    hands, knees  . Asthma   . Chicken pox   . Clavicle fracture    Left  . Depression   . Foot fracture, left   . GERD (gastroesophageal reflux disease)   . Hyperlipidemia   . Seasonal allergies   . Uterine polyp    ablation   Past Surgical History:  Procedure Laterality Date  . BIOSPY     HEAD  . CESAREAN SECTION    . COLONOSCOPY    . ENDOMETRIAL ABLATION    . MANDIBLE SURGERY    . TONSILLECTOMY    . TUBAL LIGATION     Social History   Social History  . Marital status: Divorced    Spouse name: N/A  . Number of children: N/A  . Years of education: N/A    Social History Main Topics  . Smoking status: Never Smoker  . Smokeless tobacco: Never Used  . Alcohol use No  . Drug use: No   Social History Narrative   No regular exercise.   family history includes Arthritis in her maternal grandmother and mother; Breast cancer in her maternal aunt and paternal grandmother; Breast cancer (age of onset: 27) in her mother; Diabetes in her maternal grandmother; Heart disease in her maternal grandmother; Prostate cancer in her paternal grandfather.   Review of Systems As above  Objective:  Physical Exam BP 122/70 (BP Location: Left Arm, Patient Position: Sitting, Cuff Size: Normal)   Pulse 97   Ht 5\' 1"  (1.549 m)   Wt 238 lb 3.2 oz (108 kg)   SpO2 (!) 84%   BMI 45.01 kg/m  Eyes anicteric Lings cta Cor s1s2 no rmg abd obese soft w/ + BS and no mass - mildly diffusely tender  Ext no edema Mildly anxious   Reviewed July admit to [facility] - GI path, labs, CT scans and PCO notes

## 2017-01-22 NOTE — Patient Instructions (Addendum)
  You have been scheduled for a colonoscopy. Please follow written instructions given to you at your visit today.  Please use the Clenpiq Kit we supplied today. If you use inhalers (even only as needed), please bring them with you on the day of your procedure.   I appreciate the opportunity to care for you. Silvano Rusk, MD, [facility]

## 2017-01-22 NOTE — Addendum Note (Signed)
Addended by: Gatha Mayer on: 01/22/2017 07:52 PM   Modules accepted: Level of Service

## 2017-01-26 ENCOUNTER — Encounter: Payer: Self-pay | Admitting: Internal Medicine

## 2017-02-05 ENCOUNTER — Encounter: Payer: Self-pay | Admitting: Internal Medicine

## 2017-02-05 ENCOUNTER — Ambulatory Visit (AMBULATORY_SURGERY_CENTER): Payer: Federal, State, Local not specified - PPO | Admitting: Internal Medicine

## 2017-02-05 VITALS — BP 129/80 | HR 71 | Temp 97.1°F | Resp 17 | Ht 61.0 in | Wt 238.0 lb

## 2017-02-05 DIAGNOSIS — K625 Hemorrhage of anus and rectum: Secondary | ICD-10-CM | POA: Diagnosis not present

## 2017-02-05 DIAGNOSIS — K5730 Diverticulosis of large intestine without perforation or abscess without bleeding: Secondary | ICD-10-CM

## 2017-02-05 DIAGNOSIS — R197 Diarrhea, unspecified: Secondary | ICD-10-CM

## 2017-02-05 MED ORDER — PANTOPRAZOLE SODIUM 40 MG PO TBEC: 40.0000 mg | DELAYED_RELEASE_TABLET | Freq: Two times a day (BID) | ORAL | 2 refills | Status: DC

## 2017-02-05 MED ORDER — LOPERAMIDE HCL 2 MG PO TABS: ORAL_TABLET | ORAL | 0 refills | Status: DC

## 2017-02-05 MED ORDER — SODIUM CHLORIDE 0.9 % IV SOLN: 500.0000 mL | INTRAVENOUS | Status: DC

## 2017-02-05 NOTE — Progress Notes (Signed)
Report given to PACU, vss 

## 2017-02-05 NOTE — Op Note (Addendum)
[facility] Patient Name: Tina Hinton Procedure Date: 02/05/2017 2:38 PM MRN: [id number] Endoscopist: Gatha Mayer , MD Age: 61 Referring MD:  Date of Birth: [birth year] Gender: Female Account #: [id number] Procedure:                Colonoscopy Indications:              Clinically significant diarrhea of unexplained                            origin Medicines:                Propofol per Anesthesia, Monitored Anesthesia Care Procedure:                Pre-Anesthesia Assessment:                           - Prior to the procedure, a History and Physical                            was performed, and patient medications and                            allergies were reviewed. The patient's tolerance of                            previous anesthesia was also reviewed. The risks                            and benefits of the procedure and the sedation                            options and risks were discussed with the patient.                            All questions were answered, and informed consent                            was obtained. Prior Anticoagulants: The patient has                            taken no previous anticoagulant or antiplatelet                            agents. ASA Grade Assessment: II - A patient with                            mild systemic disease. After reviewing the risks                            and benefits, the patient was deemed in                            satisfactory condition to undergo the procedure.  After obtaining informed consent, the colonoscope                            was passed under direct vision. Throughout the                            procedure, the patient's blood pressure, pulse, and                            oxygen saturations were monitored continuously. The                            Colonoscope was introduced through the anus and                            advanced to the the  terminal ileum, with                            identification of the appendiceal orifice and IC                            valve. The colonoscopy was performed without                            difficulty. The patient tolerated the procedure                            well. The quality of the bowel preparation was                            good. The terminal ileum, ileocecal valve,                            appendiceal orifice, and rectum were photographed.                            The bowel preparation used was Miralax. Scope In: 2:42:21 PM Scope Out: 2:57:40 PM Scope Withdrawal Time: 0 hours 10 minutes 31 seconds  Total Procedure Duration: 0 hours 15 minutes 19 seconds  Findings:                 The perianal examination was normal.                           The digital rectal exam findings include decreased                            sphincter tone. Pertinent negatives include no                            palpable rectal lesions.                           The terminal ileum appeared normal.  A patchy area of mildly erythematous mucosa was                            found in the sigmoid colon and in the descending                            colon. Biopsies were taken with a cold forceps for                            histology. Verification of patient identification                            for the specimen was done. Estimated blood loss was                            minimal.                           Many small and large-mouthed diverticula were found                            in the left colon. There was narrowing of the colon                            in association with the diverticular opening.                           The exam was otherwise without abnormality on                            direct and retroflexion views.                           Biopsies for histology were taken with a cold                            forceps from the right  colon for evaluation of                            microscopic colitis. Complications:            No immediate complications. Estimated Blood Loss:     Estimated blood loss was minimal. Impression:               - Decreased sphincter tone found on digital rectal                            exam.                           - The examined portion of the ileum was normal.                           - Erythematous mucosa in the sigmoid colon and in  the descending colon. Biopsied.                           - Severe diverticulosis in the left colon. There                            was narrowing of the colon in association with the                            diverticular opening.                           - The examination was otherwise normal on direct                            and retroflexion views.                           - Biopsies were taken with a cold forceps from the                            right colon for evaluation of microscopic colitis. Recommendation:           - Patient has a contact number available for                            emergencies. The signs and symptoms of potential                            delayed complications were discussed with the                            patient. Return to normal activities tomorrow.                            Written discharge instructions were provided to the                            patient.                           - Resume previous diet.                           - Continue present medications.                           - Await pathology results.                           - Repeat colonoscopy date to be determined after                            pending pathology results are reviewed.                           -  She says 2 loperamide do control things - if                            takes more can get constipated                           Dicyclomine is also helping Gatha Mayer, MD 02/05/2017  3:10:38 PM This report has been signed electronically.

## 2017-02-05 NOTE — Patient Instructions (Addendum)
There was some red change in the left colon lining - called erythema - probably not a problem but I took biopsies of that and also normal colon to see if there is any inflammation.  Once biopsies return we will call you about them and plans to help you.  You may try taking 2 loperamide (Imodium AD ) daily and up to 6 total in a day.  You also have a condition called diverticulosis - common and not usually a problem. Please read the handout provided.  No polyps and no cancer seen.  I appreciate the opportunity to care for you. Gatha Mayer, MD, [facility]   Discharge instructions given. Handout on diverticulosis. Biopsies taken. Resume previous medications. YOU HAD AN ENDOSCOPIC PROCEDURE TODAY AT [facility]:   Refer to the procedure report that was given to you for any specific questions about what was found during the examination.  If the procedure report does not answer your questions, please call your gastroenterologist to clarify.  If you requested that your care partner not be given the details of your procedure findings, then the procedure report has been included in a sealed envelope for you to review at your convenience later.  YOU SHOULD EXPECT: Some feelings of bloating in the abdomen. Passage of more gas than usual.  Walking can help get rid of the air that was put into your GI tract during the procedure and reduce the bloating. If you had a lower endoscopy (such as a colonoscopy or flexible sigmoidoscopy) you may notice spotting of blood in your stool or on the toilet paper. If you underwent a bowel prep for your procedure, you may not have a normal bowel movement for a few days.  Please Note:  You might notice some irritation and congestion in your nose or some drainage.  This is from the oxygen used during your procedure.  There is no need for concern and it should clear up in a day or so.  SYMPTOMS TO REPORT IMMEDIATELY:   Following lower endoscopy  (colonoscopy or flexible sigmoidoscopy):  Excessive amounts of blood in the stool  Significant tenderness or worsening of abdominal pains  Swelling of the abdomen that is new, acute  Fever of 100F or higher   For urgent or emergent issues, a gastroenterologist can be reached at any hour by calling [phone].   DIET:  We do recommend a small meal at first, but then you may proceed to your regular diet.  Drink plenty of fluids but you should avoid alcoholic beverages for 24 hours.  ACTIVITY:  You should plan to take it easy for the rest of today and you should NOT DRIVE or use heavy machinery until tomorrow (because of the sedation medicines used during the test).    FOLLOW UP: Our staff will call the number listed on your records the next business day following your procedure to check on you and address any questions or concerns that you may have regarding the information given to you following your procedure. If we do not reach you, we will leave a message.  However, if you are feeling well and you are not experiencing any problems, there is no need to return our call.  We will assume that you have returned to your regular daily activities without incident.  If any biopsies were taken you will be contacted by phone or by letter within the next 1-3 weeks.  Please call us at [phone] if you have  not heard about the biopsies in 3 weeks.    SIGNATURES/CONFIDENTIALITY: You and/or your care partner have signed paperwork which will be entered into your electronic medical record.  These signatures attest to the fact that that the information above on your After Visit Summary has been reviewed and is understood.  Full responsibility of the confidentiality of this discharge information lies with you and/or your care-partner.

## 2017-02-05 NOTE — Progress Notes (Signed)
Called to room to assist during endoscopic procedure.  Patient ID and intended procedure confirmed with present staff. Received instructions for my participation in the procedure from the performing physician.  

## 2017-02-06 ENCOUNTER — Telehealth: Payer: Self-pay | Admitting: *Deleted

## 2017-02-06 NOTE — Telephone Encounter (Signed)
Message left

## 2017-02-06 NOTE — Telephone Encounter (Signed)
  Follow up Call-  Call back number 02/05/2017 09/14/2015  Post procedure Call Back phone  # [phone]  Permission to leave phone message Yes Yes     LM that we will try to call her back later today as we want to speak to her to make sure she did okay after procedure yesterday  Lenard Galloway RN

## 2017-02-10 NOTE — Progress Notes (Signed)
Biopsies show changes related to the prep and not colitis  Please call from office - I think she said dicyclomine was helping when I saw her last Please find out if I have that right by asking about frequency and severity  of diarrhea and any cramps- if she is ok will continue that if not will try something else  [facility] - recall colon 10 yrs, no letter If not let me know and will try something different

## 2017-02-11 NOTE — Progress Notes (Signed)
One thing that might cause diarrhea is the pantoprazole Does she know if diarrhea began before or after starting that medication?

## 2017-02-12 ENCOUNTER — Encounter: Payer: Self-pay | Admitting: Internal Medicine

## 2017-02-12 NOTE — Progress Notes (Signed)
OK  Please start Lotronex 0.5 mg bid # 60 no refill  Tell her to stop dicyclomine and Imodium when she starts  Advise that if she gets constipation, abdominal pain on this to stop it and let me know  I would like her to send me a My Chart message about the response to this medication in 1 week - let her know that this is the starting dose and we are treating IBS-D  If she does not have an appointment on the books please make a next available one

## 2017-02-13 ENCOUNTER — Other Ambulatory Visit: Payer: Self-pay

## 2017-02-13 MED ORDER — ALOSETRON HCL 0.5 MG PO TABS: 0.5000 mg | ORAL_TABLET | Freq: Two times a day (BID) | ORAL | 0 refills | Status: DC

## 2017-02-13 NOTE — Progress Notes (Signed)
lotronex

## 2017-03-05 ENCOUNTER — Other Ambulatory Visit: Payer: Self-pay

## 2017-03-05 MED ORDER — PANTOPRAZOLE SODIUM 40 MG PO TBEC: 40.0000 mg | DELAYED_RELEASE_TABLET | Freq: Two times a day (BID) | ORAL | 5 refills | Status: DC

## 2017-03-05 NOTE — Telephone Encounter (Signed)
Pantoprazole 40mg  requires prior authorization , I've faxed the pharmacy to send me the information to do this.

## 2017-03-12 NOTE — Telephone Encounter (Signed)
Spoke to pharmacy and they are going to fax over the information we need to do the prior authorization.

## 2017-03-13 NOTE — Telephone Encounter (Signed)
Prior Authorization done for Pantoprazole 40mg  tablets thru patients insurance the [employer], Dx-GERD, K21.9.  Approval given thru 03/13/18. Spoke with [place] at [phone].  [facility] informed.

## 2017-03-17 ENCOUNTER — Encounter: Payer: Self-pay | Admitting: Internal Medicine

## 2017-03-26 ENCOUNTER — Encounter: Payer: Self-pay | Admitting: Internal Medicine

## 2017-03-27 ENCOUNTER — Other Ambulatory Visit: Payer: Self-pay | Admitting: Internal Medicine

## 2017-03-27 MED ORDER — GLUTAMINE PO POWD: 5.0000 g | Freq: Three times a day (TID) | ORAL | 0 refills | Status: DC

## 2017-04-13 ENCOUNTER — Ambulatory Visit: Payer: Federal, State, Local not specified - PPO | Admitting: Internal Medicine

## 2017-04-13 ENCOUNTER — Encounter: Payer: Self-pay | Admitting: Internal Medicine

## 2017-04-13 VITALS — BP 118/66 | HR 72 | Ht 61.5 in | Wt 237.6 lb

## 2017-04-13 DIAGNOSIS — K580 Irritable bowel syndrome with diarrhea: Secondary | ICD-10-CM | POA: Diagnosis not present

## 2017-04-13 MED ORDER — ONDANSETRON HCL 4 MG PO TABS: 4.0000 mg | ORAL_TABLET | Freq: Three times a day (TID) | ORAL | 1 refills | Status: DC | PRN

## 2017-04-13 NOTE — Progress Notes (Signed)
   Tina Hinton 61 y.o. 1956/03/14 [id number]  Assessment & Plan:   Encounter Diagnosis  Name Primary?  . Irritable bowel syndrome with diarrhea Yes    Trial of ondansetron  Stay on glutamine go to 5 g tid ? 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 F/U Appt Jan - cancel if well My Chart f/u in between   I appreciate the opportunity to care for this patient.  WU:[id number] Koren Shiver, DO  Subjective:   Chief Complaint: Diarrhea, IBS  HPI Here for f/u - alosetron no benefit. If takes loperarmide for a few days a few x a day will get constipated x several days. She is trying glutamine but only 500 mg qid not the 5 g qid I recommended.  Overall she thinks she is getting better since onset sxs earlier this year. Allergies  Allergen Reactions  . Phenylephrine-Guaifenesin Rash   Current Meds  Medication Sig  . albuterol (PROVENTIL HFA;VENTOLIN HFA) 108 (90 Base) MCG/ACT inhaler 2 puffs qid prn  . ALPRAZolam (XANAX) 0.25 MG tablet Take 1 tablet (0.25 mg total) by mouth at bedtime as needed.  . fenofibrate 160 MG tablet TAKE 1 TABLET (160 MG TOTAL) BY MOUTH DAILY.  Marland Kitchen Glutamine POWD Take 5 g by mouth 3 (three) times daily.  Marland Kitchen loperamide (IMODIUM A-D) 2 MG tablet Take 2 each AM and up to 6 total in a day to help diarrhea  . Loratadine (CLARITIN PO) Take by mouth. Reported on 10/11/2015  . pantoprazole (PROTONIX) 40 MG tablet Take 1 tablet (40 mg total) by mouth 2 (two) times daily before a meal.  . rosuvastatin (CRESTOR) 20 MG tablet TAKE 1 TABLET (20 MG TOTAL) BY MOUTH AT BEDTIME.  . Triamcinolone Acetonide (TRIAMCINOLONE 0.1 % CREAM : EUCERIN) CREA Apply 1 application topically 3 (three) times daily as needed for rash or itching.   Past Medical History:  Diagnosis Date  . Ankle fracture    Right  . Anxiety   . Arthritis    hands, knees  . Asthma   . Chicken pox   . Clavicle fracture    Left  .  Depression   . Foot fracture, left   . GERD (gastroesophageal reflux disease)   . Hyperlipidemia   . IBS (irritable bowel syndrome) 01/19/2017  . Seasonal allergies   . Uterine polyp    ablation   Past Surgical History:  Procedure Laterality Date  . BIOSPY     HEAD  . CESAREAN SECTION    . COLONOSCOPY    . ENDOMETRIAL ABLATION    . MANDIBLE SURGERY    . TONSILLECTOMY    . TUBAL LIGATION    . UPPER GASTROINTESTINAL ENDOSCOPY     Review of Systems As above alopecia  Objective:   Physical Exam BP 118/66   Pulse 72   Ht 5' 1.5" (1.562 m)   Wt 237 lb 9.6 oz (107.8 kg)   BMI 44.17 kg/m  NAD   15 minutes time spent with patient > half in counseling coordination of care

## 2017-04-13 NOTE — Patient Instructions (Addendum)
If you are age 61 or older, your body mass index should be between 23-30. Your Body mass index is 44.17 kg/m. If this is out of the aforementioned range listed, please consider follow up with your Primary Care Provider.  If you are age 34 or younger, your body mass index should be between 19-25. Your Body mass index is 44.17 kg/m. If this is out of the aformentioned range listed, please consider follow up with your Primary Care Provider.   Follow up in Jan 15th 2019 @ 4pm  Thank you for choosing [facility]  Dr. Silvano Rusk

## 2017-06-23 ENCOUNTER — Ambulatory Visit: Payer: Self-pay | Admitting: Internal Medicine

## 2017-07-17 DIAGNOSIS — J0140 Acute pansinusitis, unspecified: Secondary | ICD-10-CM | POA: Diagnosis not present

## 2017-07-17 DIAGNOSIS — R05 Cough: Secondary | ICD-10-CM | POA: Diagnosis not present

## 2017-07-17 DIAGNOSIS — R062 Wheezing: Secondary | ICD-10-CM | POA: Diagnosis not present

## 2017-07-27 ENCOUNTER — Telehealth: Payer: Self-pay | Admitting: *Deleted

## 2017-07-27 DIAGNOSIS — M25462 Effusion, left knee: Secondary | ICD-10-CM

## 2017-07-27 DIAGNOSIS — Z79899 Other long term (current) drug therapy: Secondary | ICD-10-CM

## 2017-07-27 DIAGNOSIS — F411 Generalized anxiety disorder: Secondary | ICD-10-CM

## 2017-07-27 DIAGNOSIS — F419 Anxiety disorder, unspecified: Secondary | ICD-10-CM

## 2017-07-27 NOTE — Telephone Encounter (Signed)
Ok to refill but need contract and uds

## 2017-07-27 NOTE — Telephone Encounter (Signed)
[facility] requesting refill for alprazolam  Database ran and is on your desk for review.  Last filled per database: 07/17/16 Last written: 07/17/16 Last ov: 01/19/17 Next ov: none Contract: none UDS: none

## 2017-07-28 MED ORDER — ALPRAZOLAM 0.25 MG PO TABS: 0.2500 mg | ORAL_TABLET | Freq: Every evening | ORAL | 1 refills | Status: DC | PRN

## 2017-07-28 NOTE — Telephone Encounter (Signed)
Patient notified and she will be in to do UDS and sign contract.  Rx placed up front for pickup with contract

## 2018-02-12 ENCOUNTER — Ambulatory Visit: Payer: Federal, State, Local not specified - PPO | Admitting: Family Medicine

## 2018-02-12 ENCOUNTER — Encounter: Payer: Self-pay | Admitting: Family Medicine

## 2018-02-12 VITALS — BP 112/72 | HR 89 | Temp 98.2°F | Resp 16 | Ht 61.5 in | Wt 240.0 lb

## 2018-02-12 DIAGNOSIS — R1032 Left lower quadrant pain: Secondary | ICD-10-CM | POA: Diagnosis not present

## 2018-02-12 DIAGNOSIS — Z1231 Encounter for screening mammogram for malignant neoplasm of breast: Secondary | ICD-10-CM

## 2018-02-12 DIAGNOSIS — Z1239 Encounter for other screening for malignant neoplasm of breast: Secondary | ICD-10-CM

## 2018-02-12 DIAGNOSIS — M170 Bilateral primary osteoarthritis of knee: Secondary | ICD-10-CM | POA: Diagnosis not present

## 2018-02-12 DIAGNOSIS — E2839 Other primary ovarian failure: Secondary | ICD-10-CM

## 2018-02-12 DIAGNOSIS — Z23 Encounter for immunization: Secondary | ICD-10-CM | POA: Diagnosis not present

## 2018-02-12 LAB — LIPASE: LIPASE: 12 U/L (ref 11.0–59.0)

## 2018-02-12 LAB — POC URINALSYSI DIPSTICK (AUTOMATED)
Bilirubin, UA: NEGATIVE
Blood, UA: NEGATIVE
Glucose, UA: NEGATIVE
Ketones, UA: NEGATIVE
LEUKOCYTES UA: NEGATIVE
NITRITE UA: NEGATIVE
PH UA: 5 (ref 5.0–8.0)
PROTEIN UA: NEGATIVE
Spec Grav, UA: >=1.03 — AB (ref 1.010–1.025)
Urobilinogen, UA: 0.2 E.U./dL

## 2018-02-12 LAB — COMPREHENSIVE METABOLIC PANEL
ALT: 19 U/L (ref 0–35)
AST: 22 U/L (ref 0–37)
Albumin: 4.3 g/dL (ref 3.5–5.2)
Alkaline Phosphatase: 86 U/L (ref 39–117)
BUN: 17 mg/dL (ref 6–23)
CO2: 32 mEq/L (ref 19–32)
Calcium: 9.4 mg/dL (ref 8.4–10.5)
Chloride: 103 mEq/L (ref 96–112)
Creatinine, Ser: 0.79 mg/dL (ref 0.40–1.20)
GFR: 78.21 mL/min (ref >60.00)
Glucose, Bld: 92 mg/dL (ref 70–99)
POTASSIUM: 5 meq/L (ref 3.5–5.1)
Sodium: 141 mEq/L (ref 135–145)
Total Bilirubin: 0.4 mg/dL (ref 0.2–1.2)
Total Protein: 7.1 g/dL (ref 6.0–8.3)

## 2018-02-12 LAB — CBC WITH DIFFERENTIAL/PLATELET
BASOS PCT: 0.8 % (ref 0.0–3.0)
Basophils Absolute: 0 10*3/uL (ref 0.0–0.1)
EOS PCT: 2.4 % (ref 0.0–5.0)
Eosinophils Absolute: 0.2 10*3/uL (ref 0.0–0.7)
HCT: 40.5 % (ref 36.0–46.0)
Hemoglobin: 13.4 g/dL (ref 12.0–15.0)
Lymphocytes Relative: 31 % (ref 12.0–46.0)
Lymphs Abs: 2 10*3/uL (ref 0.7–4.0)
MCHC: 33 g/dL (ref 30.0–36.0)
MCV: 87.9 fl (ref 78.0–100.0)
Monocytes Absolute: 0.6 10*3/uL (ref 0.1–1.0)
Monocytes Relative: 9.2 % (ref 3.0–12.0)
NEUTROS ABS: 3.6 10*3/uL (ref 1.4–7.7)
NEUTROS PCT: 56.6 % (ref 43.0–77.0)
PLATELETS: 238 10*3/uL (ref 150.0–400.0)
RBC: 4.61 Mil/uL (ref 3.87–5.11)
RDW: 14.1 % (ref 11.5–15.5)
WBC: 6.3 10*3/uL (ref 4.0–10.5)

## 2018-02-12 LAB — AMYLASE: AMYLASE: 30 U/L (ref 27–131)

## 2018-02-12 MED ORDER — DICLOFENAC SODIUM 75 MG PO TBEC: 75.0000 mg | DELAYED_RELEASE_TABLET | Freq: Two times a day (BID) | ORAL | 1 refills | Status: DC

## 2018-02-12 NOTE — Patient Instructions (Signed)

## 2018-02-12 NOTE — Progress Notes (Signed)
Patient ID: Tina Hinton, female    DOB: 11-Jan-1956  Age: 62 y.o. MRN: [id number]    Subjective:  Subjective  HPI Manya Balash presents for pt c/o arthritis pain since running out of diclofenac-- and her right hand ring finger gets stuck. She is also c/o LLQ pain --- no diarrhea, no blood.  She had IBS symptoms x 9 months but that has cleared up  Review of Systems  Constitutional: Negative for activity change, appetite change, fatigue and unexpected weight change.  Respiratory: Negative for cough and shortness of breath.   Cardiovascular: Negative for chest pain and palpitations.  Gastrointestinal: Positive for abdominal pain. Negative for blood in stool, constipation, diarrhea and nausea.  Musculoskeletal: Positive for arthralgias. Negative for back pain.  Psychiatric/Behavioral: Negative for behavioral problems and dysphoric mood. The patient is not nervous/anxious.     History Past Medical History:  Diagnosis Date  . Ankle fracture    Right  . Anxiety   . Arthritis    hands, knees  . Asthma   . Chicken pox   . Clavicle fracture    Left  . Depression   . Foot fracture, left   . GERD (gastroesophageal reflux disease)   . Hyperlipidemia   . IBS (irritable bowel syndrome) 01/19/2017  . Seasonal allergies   . Uterine polyp    ablation    She has a past surgical history that includes Tonsillectomy; Mandible surgery; Cesarean section; Tubal ligation; Endometrial ablation; Colonoscopy; BIOSPY; and Upper gastrointestinal endoscopy.   Her family history includes Arthritis in her maternal grandmother and mother; Breast cancer in her maternal aunt and paternal grandmother; Breast cancer (age of onset: 60) in her mother; Diabetes in her maternal grandmother; Heart disease in her maternal grandmother; Prostate cancer in her paternal grandfather.She reports that she has never smoked. She has never used smokeless tobacco. She reports that she does not drink alcohol or use  drugs.  Current Outpatient Medications on File Prior to Visit  Medication Sig Dispense Refill  . albuterol (PROVENTIL HFA;VENTOLIN HFA) 108 (90 Base) MCG/ACT inhaler 2 puffs qid prn 1 Inhaler 2  . diclofenac sodium (VOLTAREN) 1 % GEL Apply topically 4 (four) times daily.    . fenofibrate 160 MG tablet TAKE 1 TABLET (160 MG TOTAL) BY MOUTH DAILY. 90 tablet 1  . lansoprazole (PREVACID) 15 MG capsule Take 15 mg by mouth daily at 12 noon.    . Loratadine (CLARITIN PO) Take by mouth. Reported on 10/11/2015    . rosuvastatin (CRESTOR) 20 MG tablet TAKE 1 TABLET (20 MG TOTAL) BY MOUTH AT BEDTIME. 90 tablet 1  . Triamcinolone Acetonide (TRIAMCINOLONE 0.1 % CREAM : EUCERIN) CREA Apply 1 application topically 3 (three) times daily as needed for rash or itching. 1 each 3   No current facility-administered medications on file prior to visit.      Objective:  Objective  Physical Exam  Constitutional: She is oriented to person, place, and time. She appears well-developed and well-nourished.  HENT:  Head: Normocephalic and atraumatic.  Eyes: Conjunctivae and EOM are normal.  Neck: Normal range of motion. Neck supple. No JVD present. Carotid bruit is not present. No thyromegaly present.  Cardiovascular: Normal rate, regular rhythm and normal heart sounds.  No murmur heard. Pulmonary/Chest: Effort normal and breath sounds normal. No respiratory distress. She has no wheezes. She has no rales. She exhibits no tenderness.  Musculoskeletal: She exhibits no edema.  Neurological: She is alert and oriented to person, place, and time.  Psychiatric:  She has a normal mood and affect.  Nursing note and vitals reviewed.  BP 112/72 (BP Location: Left Arm, Patient Position: Sitting, Cuff Size: Large)   Pulse 89   Temp 98.2 F (36.8 C) (Oral)   Resp 16   Ht 5' 1.5" (1.562 m)   Wt 240 lb (108.9 kg)   SpO2 97%   BMI 44.61 kg/m  Wt Readings from Last 3 Encounters:  02/12/18 240 lb (108.9 kg)  04/13/17 237 lb  9.6 oz (107.8 kg)  02/05/17 238 lb (108 kg)     Lab Results  Component Value Date   WBC 6.3 02/12/2018   HGB 13.4 02/12/2018   HCT 40.5 02/12/2018   PLT 238.0 02/12/2018   GLUCOSE 92 02/12/2018   CHOL 179 01/20/2017   TRIG 171.0 (H) 01/20/2017   HDL 41.90 01/20/2017   LDLDIRECT 155.0 06/25/2015   LDLCALC 103 (H) 01/20/2017   ALT 19 02/12/2018   AST 22 02/12/2018   NA 141 02/12/2018   K 5.0 02/12/2018   CL 103 02/12/2018   CREATININE 0.79 02/12/2018   BUN 17 02/12/2018   CO2 32 02/12/2018   TSH 1.74 01/20/2017    Mm Screening Breast Tomo Bilateral  Result Date: 07/23/2016 CLINICAL DATA:  Screening. EXAM: 2D DIGITAL SCREENING BILATERAL MAMMOGRAM WITH CAD AND ADJUNCT TOMO COMPARISON:  Previous exam(s). ACR Breast Density Category b: There are scattered areas of fibroglandular density. FINDINGS: There are no findings suspicious for malignancy. Images were processed with CAD. IMPRESSION: No mammographic evidence of malignancy. A result letter of this screening mammogram will be mailed directly to the patient. RECOMMENDATION: Screening mammogram in one year. (Code:SM-B-01Y) BI-RADS CATEGORY  1: Negative. Electronically Signed   By: Lillia Mountain M.D.   On: 07/23/2016 08:02     Assessment & Plan:  Plan  I have discontinued Scherry Michalec's loperamide, pantoprazole, Glutamine, ondansetron, and ALPRAZolam. I have also changed her diclofenac. Additionally, I am having her maintain her albuterol, Loratadine (CLARITIN PO), triamcinolone 0.1 % cream : eucerin, rosuvastatin, fenofibrate, diclofenac sodium, and lansoprazole.  Meds ordered this encounter  Medications  . diclofenac (VOLTAREN) 75 MG EC tablet    Sig: Take 1 tablet (75 mg total) by mouth 2 (two) times daily.    Dispense:  180 tablet    Refill:  1    Problem List Items Addressed This Visit      Unprioritized   Osteoarthritis of both knees   Relevant Medications   diclofenac (VOLTAREN) 75 MG EC tablet    Other Visit  Diagnoses    Need for influenza vaccination    -  Primary   Relevant Orders   Flu Vaccine QUAD 6+ mos PF IM (Fluarix Quad PF) (Completed)   LLQ pain       Relevant Orders   CBC with Differential/Platelet (Completed)   Comprehensive metabolic panel (Completed)   POCT Urinalysis Dipstick (Automated) (Completed)   Amylase (Completed)   Lipase (Completed)   CT Abdomen Pelvis W Contrast   Left lower quadrant pain       Relevant Orders   CT Abdomen Pelvis W Contrast   Estrogen deficiency       Relevant Orders   MM DIGITAL SCREENING BILATERAL   DG Bone Density   Breast cancer screening       Relevant Orders   MM DIGITAL SCREENING BILATERAL   DG Bone Density   Need for pneumococcal vaccination       Relevant Orders   Pneumococcal polysaccharide vaccine 23-valent  greater than or equal to 2yo subcutaneous/IM (Completed)   Need for shingles vaccine       Relevant Orders   Varicella-zoster vaccine IM (Shingrix) (Completed)      Follow-up: Return if symptoms worsen or fail to improve, for annual exam, fasting.  Ann Held, DO

## 2018-02-15 ENCOUNTER — Ambulatory Visit (HOSPITAL_BASED_OUTPATIENT_CLINIC_OR_DEPARTMENT_OTHER)
Admission: RE | Admit: 2018-02-15 | Discharge: 2018-02-15 | Disposition: A | Discharge status: Home / Self Care | Payer: Federal, State, Local not specified - PPO | Source: Ambulatory Visit | Attending: Family Medicine | Admitting: Family Medicine

## 2018-02-15 ENCOUNTER — Encounter: Payer: Self-pay | Admitting: Family Medicine

## 2018-02-15 DIAGNOSIS — K5730 Diverticulosis of large intestine without perforation or abscess without bleeding: Secondary | ICD-10-CM | POA: Insufficient documentation

## 2018-02-15 DIAGNOSIS — I700 Atherosclerosis of aorta: Secondary | ICD-10-CM | POA: Insufficient documentation

## 2018-02-15 DIAGNOSIS — R1032 Left lower quadrant pain: Secondary | ICD-10-CM | POA: Diagnosis not present

## 2018-02-15 MED ORDER — IOPAMIDOL (ISOVUE-300) INJECTION 61%
100.0000 mL | Freq: Once | INTRAVENOUS | Status: AC | PRN
  Administered 2018-02-15: 100 mL via INTRAVENOUS

## 2018-02-16 ENCOUNTER — Encounter: Payer: Self-pay | Admitting: *Deleted

## 2018-02-18 ENCOUNTER — Ambulatory Visit (HOSPITAL_BASED_OUTPATIENT_CLINIC_OR_DEPARTMENT_OTHER)
Admission: RE | Admit: 2018-02-18 | Discharge: 2018-02-18 | Disposition: A | Discharge status: Home / Self Care | Payer: Federal, State, Local not specified - PPO | Source: Ambulatory Visit | Attending: Family Medicine | Admitting: Family Medicine

## 2018-02-18 DIAGNOSIS — E2839 Other primary ovarian failure: Secondary | ICD-10-CM

## 2018-02-18 DIAGNOSIS — Z1231 Encounter for screening mammogram for malignant neoplasm of breast: Secondary | ICD-10-CM | POA: Diagnosis not present

## 2018-02-18 DIAGNOSIS — M8588 Other specified disorders of bone density and structure, other site: Secondary | ICD-10-CM | POA: Diagnosis not present

## 2018-02-18 DIAGNOSIS — Z1239 Encounter for other screening for malignant neoplasm of breast: Secondary | ICD-10-CM

## 2018-02-18 DIAGNOSIS — Z780 Asymptomatic menopausal state: Secondary | ICD-10-CM | POA: Diagnosis not present

## 2018-02-19 ENCOUNTER — Telehealth: Payer: Self-pay | Admitting: *Deleted

## 2018-02-19 DIAGNOSIS — D219 Benign neoplasm of connective and other soft tissue, unspecified: Secondary | ICD-10-CM

## 2018-02-19 NOTE — Telephone Encounter (Signed)
Patient states that she still has pain that comes and goes.  Any next steps?

## 2018-02-19 NOTE — Telephone Encounter (Signed)
Patient notified and referral placed for gyn

## 2018-02-19 NOTE — Telephone Encounter (Signed)
Does she have a gyn?

## 2018-02-19 NOTE — Telephone Encounter (Signed)
-----   Message from Ann Held, [place] sent at 02/15/2018  1:14 PM EDT ----- No diverticulitis   + fibroids  Has pain subsided?

## 2018-03-04 DIAGNOSIS — H40013 Open angle with borderline findings, low risk, bilateral: Secondary | ICD-10-CM | POA: Diagnosis not present

## 2018-03-04 DIAGNOSIS — H5361 Abnormal dark adaptation curve: Secondary | ICD-10-CM | POA: Diagnosis not present

## 2018-03-22 ENCOUNTER — Encounter: Payer: Self-pay | Admitting: Obstetrics & Gynecology

## 2018-03-22 ENCOUNTER — Ambulatory Visit (INDEPENDENT_AMBULATORY_CARE_PROVIDER_SITE_OTHER): Payer: Federal, State, Local not specified - PPO | Admitting: Obstetrics & Gynecology

## 2018-03-22 VITALS — BP 128/85 | HR 75 | Ht 61.5 in | Wt 245.1 lb

## 2018-03-22 DIAGNOSIS — D219 Benign neoplasm of connective and other soft tissue, unspecified: Secondary | ICD-10-CM

## 2018-03-22 DIAGNOSIS — Z124 Encounter for screening for malignant neoplasm of cervix: Secondary | ICD-10-CM

## 2018-03-22 DIAGNOSIS — Z1151 Encounter for screening for human papillomavirus (HPV): Secondary | ICD-10-CM

## 2018-03-22 DIAGNOSIS — Z01419 Encounter for gynecological examination (general) (routine) without abnormal findings: Secondary | ICD-10-CM | POA: Diagnosis not present

## 2018-03-22 DIAGNOSIS — N951 Menopausal and female climacteric states: Secondary | ICD-10-CM

## 2018-03-22 MED ORDER — PAROXETINE HCL 10 MG PO TABS: 10.0000 mg | ORAL_TABLET | Freq: Every day | ORAL | 3 refills | Status: DC

## 2018-03-22 NOTE — Progress Notes (Signed)
Had an ablation in 2010.

## 2018-03-22 NOTE — Patient Instructions (Signed)
Menopause Menopause is the normal time of life when menstrual periods stop completely. Menopause is complete when you have missed 12 consecutive menstrual periods. It usually occurs between the ages of 48 years and 55 years. Very rarely does a woman develop menopause before the age of 40 years. At menopause, your ovaries stop producing the female hormones estrogen and progesterone. This can cause undesirable symptoms and also affect your health. Sometimes the symptoms may occur 4-5 years before the menopause begins. There is no relationship between menopause and:  Oral contraceptives.  Number of children you had.  Race.  The age your menstrual periods started (menarche).  Heavy smokers and very thin women may develop menopause earlier in life. What are the causes?  The ovaries stop producing the female hormones estrogen and progesterone. Other causes include:  Surgery to remove both ovaries.  The ovaries stop functioning for no known reason.  Tumors of the pituitary gland in the brain.  Medical disease that affects the ovaries and hormone production.  Radiation treatment to the abdomen or pelvis.  Chemotherapy that affects the ovaries.  What are the signs or symptoms?  Hot flashes.  Night sweats.  Decrease in sex drive.  Vaginal dryness and thinning of the vagina causing painful intercourse.  Dryness of the skin and developing wrinkles.  Headaches.  Tiredness.  Irritability.  Memory problems.  Weight gain.  Bladder infections.  Hair growth of the face and chest.  Infertility. More serious symptoms include:  Loss of bone (osteoporosis) causing breaks (fractures).  Depression.  Hardening and narrowing of the arteries (atherosclerosis) causing heart attacks and strokes.  How is this diagnosed?  When the menstrual periods have stopped for 12 straight months.  Physical exam.  Hormone studies of the blood. How is this treated? There are many treatment  choices and nearly as many questions about them. The decisions to treat or not to treat menopausal changes is an individual choice made with your health care provider. Your health care provider can discuss the treatments with you. Together, you can decide which treatment will work best for you. Your treatment choices may include:  Hormone therapy (estrogen and progesterone).  Non-hormonal medicines.  Treating the individual symptoms with medicine (for example antidepressants for depression).  Herbal medicines that may help specific symptoms.  Counseling by a psychiatrist or psychologist.  Group therapy.  Lifestyle changes including: ? Eating healthy. ? Regular exercise. ? Limiting caffeine and alcohol. ? Stress management and meditation.  No treatment.  Follow these instructions at home:  Take the medicine your health care provider gives you as directed.  Get plenty of sleep and rest.  Exercise regularly.  Eat a diet that contains calcium (good for the bones) and soy products (acts like estrogen hormone).  Avoid alcoholic beverages.  Do not smoke.  If you have hot flashes, dress in layers.  Take supplements, calcium, and vitamin D to strengthen bones.  You can use over-the-counter lubricants or moisturizers for vaginal dryness.  Group therapy is sometimes very helpful.  Acupuncture may be helpful in some cases. Contact a health care provider if:  You are not sure you are in menopause.  You are having menopausal symptoms and need advice and treatment.  You are still having menstrual periods after age 55 years.  You have pain with intercourse.  Menopause is complete (no menstrual period for 12 months) and you develop vaginal bleeding.  You need a referral to a specialist (gynecologist, psychiatrist, or psychologist) for treatment. Get help right   away if:  You have severe depression.  You have excessive vaginal bleeding.  You fell and think you have a  broken bone.  You have pain when you urinate.  You develop leg or chest pain.  You have a fast pounding heart beat (palpitations).  You have severe headaches.  You develop vision problems.  You feel a lump in your breast.  You have abdominal pain or severe indigestion. This information is not intended to replace advice given to you by your health care provider. Make sure you discuss any questions you have with your health care provider. Document Released: 08/16/2003 Document Revised: 11/01/2015 Document Reviewed: 12/23/2012 Elsevier Interactive Patient Education  2017 Elsevier Inc.  

## 2018-03-22 NOTE — Progress Notes (Signed)
Subjective:     Tina Hinton is a 62 y.o. female here for a routine exam. LMP 2010 Current complaints: pain on LLQ.  Pt is s/p endometrial ablation in 2010. Pt has IBS and recently had a CT.  Pt reports nagging pain that is dull and 'I know its there.'  Pt denies recent h/o IBS sx for the past 9 months.   Pt reports menopause sx since 56. She reports recurrent hot flushes.   Pts mother and aunt both had breast cancer. Both in their 30's and died in their 2s.    Gynecologic History No LMP recorded. Patient is postmenopausal. Contraception: post menopausal status Last Pap: unknown. Results were: no h/o abnormal PAP Last mammogram: 02/18/2018. Results were: normal  Obstetric History OB History  Gravida Para Term Preterm AB Living  3 3 3     3   SAB TAB Ectopic Multiple Live Births          3    # Outcome Date GA Lbr Len/2nd Weight Sex Delivery Anes PTL Lv  3 Term 1983 [redacted]w[redacted]d   M CS-LTranv Spinal  LIV  2 Term 1981 [redacted]w[redacted]d   M CS-LTranv Spinal  LIV  1 Term 1979 [redacted]w[redacted]d   M Vag-Spont EPI  LIV   The following portions of the patient's history were reviewed and updated as appropriate: allergies, current medications, past family history, past medical history, past social history, past surgical history and problem list.  Review of Systems Pertinent items are noted in HPI.    Objective:  BP 128/85   Pulse 75   Ht 5' 1.5" (1.562 m)   Wt 245 lb 1.3 oz (111.2 kg)   BMI 45.56 kg/m   General Appearance:    Alert, cooperative, no distress, appears stated age  Head:    Normocephalic, without obvious abnormality, atraumatic  Eyes:    conjunctiva/corneas clear, EOM's intact, both eyes  Ears:    Normal external ear canals, both ears  Nose:   Nares normal, septum midline, mucosa normal, no drainage    or sinus tenderness  Throat:   Lips, mucosa, and tongue normal; teeth and gums normal  Neck:   Supple, symmetrical, trachea midline, no adenopathy;    thyroid:  no  enlargement/tenderness/nodules  Back:     Symmetric, no curvature, ROM normal, no CVA tenderness  Lungs:     Clear to auscultation bilaterally, respirations unlabored  Chest Wall:    No tenderness or deformity   Heart:    Regular rate and rhythm, S1 and S2 normal, no murmur, rub   or gallop  Breast Exam:    No tenderness, masses, or nipple abnormality  Abdomen:     Soft, non-tender, bowel sounds active all four quadrants,    no masses, no organomegaly  Genitalia:    Normal female without lesion, discharge or tenderness     Extremities:   Extremities normal, atraumatic, no cyanosis or edema  Pulses:   2+ and symmetric all extremities  Skin:   Skin color, texture, turgor normal, no rashes or lesions    02/15/2018 CLINICAL DATA:  62 year old female with left lower quadrant pain and tenderness for several weeks. History of uterine polyps, inflammatory bowel syndrome and gastroesophageal reflux. Initial encounter.  EXAM: CT ABDOMEN AND PELVIS WITH CONTRAST  TECHNIQUE: Multidetector CT imaging of the abdomen and pelvis was performed using the standard protocol following bolus administration of intravenous contrast.  CONTRAST:  145mL ISOVUE-300 IOPAMIDOL (ISOVUE-300) INJECTION 61%  COMPARISON:  01/05/2017 CT.  FINDINGS: Lower chest: Minimal scarring lung bases. Heart size within normal limits.  Hepatobiliary: No worrisome hepatic lesion. No calcified gallstones or CT evidence of inflammation.  Pancreas: No worrisome pancreatic mass or inflammation.  Spleen: No splenic mass or enlargement.  Adrenals/Urinary Tract: No obstructing stone or hydronephrosis. Tiny right renal lesions statistically likely cysts but too small to characterize. No worrisome renal lesion noted. No adrenal mass. Contracted urinary bladder with limited evaluation.  Stomach/Bowel: Prominent diverticulosis descending colon and sigmoid colon. Significant associated muscular hypertrophy of the  sigmoid colon without extraluminal bowel inflammatory process. Given the degree of muscular hypertrophy, limited for evaluating for underlying mass.  Appendix not visualized. No right lower quadrant inflammatory process noted.  Small hiatal hernia. Under distended stomach without gross abnormality. Small duodenal diverticulum.  Vascular/Lymphatic: Atherosclerotic changes aorta and aortic branch vessels without evidence of abdominal aortic aneurysm or large vessel occlusion. Scattered normal size lymph nodes.  Reproductive: Lobulated uterus suggestive of fibroids. No adnexal mass seen separate from such.  Other: No free air or bowel containing hernia. Small fat containing hernia base of the umbilicus.  Musculoskeletal: Moderate L5-S1 facet degenerative changes with minimal anterior slip L5. Mild bilateral hip joint degenerative changes. Mild sacroiliac joint degenerative changes.  IMPRESSION: 1. Prominent diverticulosis descending colon and sigmoid colon. Significant associated muscular hypertrophy of the sigmoid colon without extraluminal bowel inflammatory process to suggest diverticulitis. Given the degree of muscular hypertrophy, limited for evaluating for underlying mass. 2. Appendix not visualized. No right lower quadrant inflammatory process noted. 3.  Aortic Atherosclerosis (ICD10-I70.0). 4. Lobulated uterus suggestive of fibroids. 5. Moderate L5-S1 facet degenerative changes with minimal anterior slip L5.  Assessment:    Healthy female exam.   Family history of breast cancer.  Risk assessment for Hereditary cancer syndrome done- NEG Hot flushes- discussed hormonal, nonhormonal and nonpharmacologic management options.    fibroids- asymptomatic. Not tjhought to be the etiology of pts LLQ sx.    Plan:    Follow up in: 1 year.  for annual Paxil 10mg  daily F/u in 3 months or reassess response to meds of hot flashes.  F/u sooner prn  Fayth Trefry L.  Harraway-Smith, M.D., Cherlynn June

## 2018-03-24 LAB — CYTOLOGY - PAP
DIAGNOSIS: UNDETERMINED — AB
HPV: NOT DETECTED

## 2018-03-29 ENCOUNTER — Telehealth: Payer: Self-pay

## 2018-03-29 NOTE — Telephone Encounter (Signed)
Left message for patient to return call to office for results. Marisha Renier RN  

## 2018-03-29 NOTE — Telephone Encounter (Signed)
Patient called back to office and made aware of ASCUS papsmear and negative for HPV.   Patient made aware that it is very important for her to follow up in one year for her next pap smear. Kathrene Alu RN

## 2018-08-03 ENCOUNTER — Telehealth: Payer: Self-pay | Admitting: *Deleted

## 2018-08-03 NOTE — Telephone Encounter (Signed)
Pt is due for 2nd Shingrix vaccine. Left detailed message on voicemail to call and schedule a nurse visit to complete the series soon. Also sent mychart message.

## 2018-08-04 ENCOUNTER — Other Ambulatory Visit: Payer: Self-pay | Admitting: Family Medicine

## 2018-08-04 DIAGNOSIS — M170 Bilateral primary osteoarthritis of knee: Secondary | ICD-10-CM

## 2018-10-15 ENCOUNTER — Ambulatory Visit: Payer: Self-pay | Admitting: *Deleted

## 2018-10-15 ENCOUNTER — Ambulatory Visit (INDEPENDENT_AMBULATORY_CARE_PROVIDER_SITE_OTHER): Payer: Federal, State, Local not specified - PPO | Admitting: Family Medicine

## 2018-10-15 ENCOUNTER — Encounter: Payer: Self-pay | Admitting: Family Medicine

## 2018-10-15 ENCOUNTER — Other Ambulatory Visit: Payer: Self-pay

## 2018-10-15 DIAGNOSIS — J4520 Mild intermittent asthma, uncomplicated: Secondary | ICD-10-CM

## 2018-10-15 DIAGNOSIS — J209 Acute bronchitis, unspecified: Secondary | ICD-10-CM | POA: Diagnosis not present

## 2018-10-15 DIAGNOSIS — R110 Nausea: Secondary | ICD-10-CM | POA: Diagnosis not present

## 2018-10-15 MED ORDER — ONDANSETRON HCL 4 MG PO TABS: 4.0000 mg | ORAL_TABLET | Freq: Three times a day (TID) | ORAL | 0 refills | Status: DC | PRN

## 2018-10-15 MED ORDER — PREDNISONE 10 MG PO TABS: ORAL_TABLET | ORAL | 0 refills | Status: DC

## 2018-10-15 MED ORDER — ALBUTEROL SULFATE HFA 108 (90 BASE) MCG/ACT IN AERS: INHALATION_SPRAY | RESPIRATORY_TRACT | 2 refills | Status: DC

## 2018-10-15 NOTE — Telephone Encounter (Signed)
Virtual visit scheduled.  

## 2018-10-15 NOTE — Telephone Encounter (Signed)
Pt called with having symptoms of headache, fatigue, chills, and nausea. She has a hx of asthma. Denies fever or shortness of breath. 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 She went to her office to print some information and to the grocery store and gas station yesterday.  Requesting an appointment for recommendation to return to work. Advised of virtual appointment. LB at [facility] notified regarding an appointment.  Unable to connect call. Scheduler at [facility], will call back for an appointment. Pt voiced understanding.  Reason for Disposition . [1] COVID-19 infection diagnosed or suspected AND [2] mild symptoms (fever, cough) AND [3] no trouble breathing or other complications  Answer Assessment - Initial Assessment Questions 1. COVID-19 DIAGNOSIS: "Who made your Coronavirus (COVID-19) diagnosis?" "Was it confirmed by a positive lab test?" If not diagnosed by a HCP, ask "Are there lots of cases (community spread) where you live?" (See public health department website, if unsure)   * MAJOR community spread: high number of cases; numbers of cases are increasing; many people hospitalized.   * MINOR community spread: low number of cases; not increasing; few or no people hospitalized     In the community 2. ONSET: "When did the COVID-19 symptoms start?"      This morning 3. WORST SYMPTOM: "What is your worst symptom?" (e.g., cough, fever, shortness of breath, muscle aches)     Headache and nausea 4. COUGH: "Do you have a cough?" If so, ask: "How bad is the cough?"       Cough because of asthma 5. FEVER: "Do you have a fever?" If so, ask: "What is your temperature, how was it measured, and when did it start?"     no 6. RESPIRATORY STATUS: "Describe your breathing?" (e.g., shortness of breath, wheezing, unable to speak)      no 7. BETTER-SAME-WORSE: "Are you getting better, staying the same or getting worse compared to yesterday?"  If getting worse, ask, "In  what way?"     worst 8. HIGH RISK DISEASE: "Do you have any chronic medical problems?" (e.g., asthma, heart or lung disease, weak immune system, etc.)     Asthma,  9. PREGNANCY: "Is there any chance you are pregnant?" "When was your last menstrual period?"     no 10. OTHER SYMPTOMS: "Do you have any other symptoms?"  (e.g., runny nose, headache, sore throat, loss of smell)       Headache  Protocols used: CORONAVIRUS (COVID-19) DIAGNOSED OR SUSPECTED-A-AH

## 2018-10-15 NOTE — Progress Notes (Signed)
Virtual Visit via Video Note  I connected with Tina Hinton on 10/15/18 at  1:40 PM EDT by a video enabled telemedicine application and verified that I am speaking with the correct person using two identifiers.  Location: Patient: home Provider: office   I discussed the limitations of evaluation and management by telemedicine and the availability of in person appointments. The patient expressed understanding and agreed to proceed.  History of Present Illness: Pt woke up this am feeling warm, chills, nausea,  Lightheaded.  No congestion.   + cough  + wheeze  She feels tight in the chest but denies sob  No fevers that she knows of    Observations/Objective: Unable to get vitals  Pt is home  In NAD-- is coughing and has audible wheezing -  Assessment and Plan:  1 Mild intermittent asthma, unspecified whether complicated Pt in NAD today but has audible exp wheezing during video visit - predniSONE (DELTASONE) 10 MG tablet; TAKE 3 TABLETS PO QD FOR 3 DAYS THEN TAKE 2 TABLETS PO QD FOR 3 DAYS THEN TAKE 1 TABLET PO QD FOR 3 DAYS THEN TAKE 1/2 TAB PO QD FOR 3 DAYS  Dispense: 20 tablet; Refill: 0  3. Acute bronchitis, unspecified organism  - albuterol (VENTOLIN HFA) 108 (90 Base) MCG/ACT inhaler; 2 puffs qid prn  Dispense: 1 Inhaler; Refill: 2  4. Nausea  - ondansetron (ZOFRAN) 4 MG tablet; Take 1 tablet (4 mg total) by mouth every 8 (eight) hours as needed for nausea or vomiting.  Dispense: 20 tablet; Refill: 0  +possible covid==== pt instructed to quarantine herself until 72 hr symptoms free== she was also given the # to get tested at [facility] if needed  If sob worsens -- so to ER  Follow Up Instructions:    I discussed the assessment and treatment plan with the patient. The patient was provided an opportunity to ask questions and all were answered. The patient agreed with the plan and demonstrated an understanding of the instructions.   The patient was advised to call back or seek  an in-person evaluation if the symptoms worsen or if the condition fails to improve as anticipated.  I provided 15 minutes of non-face-to-face time during this encounter.   Ann Held, DO

## 2019-02-02 ENCOUNTER — Other Ambulatory Visit: Payer: Self-pay

## 2019-02-02 DIAGNOSIS — N951 Menopausal and female climacteric states: Secondary | ICD-10-CM

## 2019-02-02 MED ORDER — PAROXETINE HCL 10 MG PO TABS: 10.0000 mg | ORAL_TABLET | Freq: Every day | ORAL | 1 refills | Status: DC

## 2019-02-02 NOTE — Telephone Encounter (Signed)
Patient given refill until appointment. Kathrene Alu RN

## 2019-02-16 ENCOUNTER — Other Ambulatory Visit: Payer: Self-pay | Admitting: Family Medicine

## 2019-02-16 DIAGNOSIS — M170 Bilateral primary osteoarthritis of knee: Secondary | ICD-10-CM

## 2019-03-23 ENCOUNTER — Encounter: Payer: Self-pay | Admitting: Obstetrics & Gynecology

## 2019-03-23 ENCOUNTER — Ambulatory Visit (INDEPENDENT_AMBULATORY_CARE_PROVIDER_SITE_OTHER): Payer: Federal, State, Local not specified - PPO | Admitting: Obstetrics & Gynecology

## 2019-03-23 ENCOUNTER — Other Ambulatory Visit: Payer: Self-pay

## 2019-03-23 VITALS — BP 121/67 | HR 80 | Ht 61.5 in | Wt 254.0 lb

## 2019-03-23 DIAGNOSIS — Z01419 Encounter for gynecological examination (general) (routine) without abnormal findings: Secondary | ICD-10-CM | POA: Diagnosis not present

## 2019-03-23 DIAGNOSIS — Z1151 Encounter for screening for human papillomavirus (HPV): Secondary | ICD-10-CM

## 2019-03-23 DIAGNOSIS — Z1231 Encounter for screening mammogram for malignant neoplasm of breast: Secondary | ICD-10-CM

## 2019-03-23 DIAGNOSIS — N951 Menopausal and female climacteric states: Secondary | ICD-10-CM

## 2019-03-23 DIAGNOSIS — Z124 Encounter for screening for malignant neoplasm of cervix: Secondary | ICD-10-CM

## 2019-03-23 MED ORDER — PAROXETINE HCL 10 MG PO TABS: 10.0000 mg | ORAL_TABLET | Freq: Every day | ORAL | 4 refills | Status: DC

## 2019-03-23 NOTE — Progress Notes (Signed)
Subjective:     Tina Hinton is a 64 y.o. female here for a routine exam.  Current complaints: Pt reports increased weight gain since she's been working from home. She denies any GYN sx. NO incontinence and hot flushes are well controlled with the Paxil.     Gynecologic History No LMP recorded. Patient is postmenopausal. Contraception: post menopausal status Last Pap: ASCUS neg hrHPV. 03/22/2018 Last mammogram: 02/18/2018. Results were: normal  Obstetric History OB History  Gravida Para Term Preterm AB Living  3 3 3     3   SAB TAB Ectopic Multiple Live Births          3    # Outcome Date GA Lbr Len/2nd Weight Sex Delivery Anes PTL Lv  3 Term 1983 [redacted]w[redacted]d   M CS-LTranv Spinal  LIV  2 Term 1981 [redacted]w[redacted]d   M CS-LTranv Spinal  LIV  1 Term 1979 [redacted]w[redacted]d   M Vag-Spont EPI  LIV   The following portions of the patient's history were reviewed and updated as appropriate: allergies, current medications, past family history, past medical history, past social history, past surgical history and problem list.  Review of Systems Pertinent items are noted in HPI.    Objective:  BP 121/67   Pulse 80   Ht 5' 1.5" (1.562 m)   Wt 254 lb (115.2 kg)   BMI 47.22 kg/m   General Appearance:    Alert, cooperative, no distress, appears stated age  Head:    Normocephalic, without obvious abnormality, atraumatic  Eyes:    conjunctiva/corneas clear, EOM's intact, both eyes  Ears:    Normal external ear canals, both ears  Nose:   Nares normal, septum midline, mucosa normal, no drainage    or sinus tenderness  Throat:   Lips, mucosa, and tongue normal; teeth and gums normal  Neck:   Supple, symmetrical, trachea midline, no adenopathy;    thyroid:  no enlargement/tenderness/nodules  Back:     Symmetric, no curvature, ROM normal, no CVA tenderness  Lungs:     respirations unlabored  Chest Wall:    No tenderness or deformity   Heart:    Regular rate and rhythm  Breast Exam:    No tenderness, masses, or  nipple abnormality  Abdomen:     Soft, non-tender, bowel sounds active all four quadrants,    no masses, no organomegaly  Genitalia:    Normal female without lesion, discharge or tenderness     Extremities:   Extremities normal, atraumatic, no cyanosis or edema  Pulses:   2+ and symmetric all extremities  Skin:   Skin color, texture, turgor normal, no rashes or lesions      Assessment:    Healthy female exam.   Hot flushes- controlled with Paxil    Plan:    Follow up in: 1 year.    Refilled Paxil Discussed with pt diet and exercise.  Screening mammogram    Michaell Grider L. Harraway-Smith, M.D., Cherlynn June

## 2019-03-30 LAB — CYTOLOGY - PAP
Comment: NEGATIVE
Diagnosis: NEGATIVE
High risk HPV: NEGATIVE

## 2019-04-01 ENCOUNTER — Inpatient Hospital Stay (HOSPITAL_BASED_OUTPATIENT_CLINIC_OR_DEPARTMENT_OTHER): Admission: RE | Admit: 2019-04-01 | Payer: Federal, State, Local not specified - PPO | Source: Ambulatory Visit

## 2019-04-04 ENCOUNTER — Other Ambulatory Visit: Payer: Self-pay

## 2019-04-04 ENCOUNTER — Encounter (HOSPITAL_BASED_OUTPATIENT_CLINIC_OR_DEPARTMENT_OTHER): Payer: Self-pay

## 2019-04-04 ENCOUNTER — Ambulatory Visit (HOSPITAL_BASED_OUTPATIENT_CLINIC_OR_DEPARTMENT_OTHER)
Admission: RE | Admit: 2019-04-04 | Discharge: 2019-04-04 | Disposition: A | Discharge status: Home / Self Care | Payer: Federal, State, Local not specified - PPO | Source: Ambulatory Visit | Attending: Obstetrics & Gynecology | Admitting: Obstetrics & Gynecology

## 2019-04-04 ENCOUNTER — Other Ambulatory Visit (HOSPITAL_BASED_OUTPATIENT_CLINIC_OR_DEPARTMENT_OTHER): Payer: Self-pay | Admitting: Radiology

## 2019-04-04 ENCOUNTER — Ambulatory Visit (HOSPITAL_BASED_OUTPATIENT_CLINIC_OR_DEPARTMENT_OTHER): Payer: Federal, State, Local not specified - PPO

## 2019-04-04 DIAGNOSIS — Z1231 Encounter for screening mammogram for malignant neoplasm of breast: Secondary | ICD-10-CM | POA: Diagnosis not present

## 2019-04-08 DIAGNOSIS — Z20828 Contact with and (suspected) exposure to other viral communicable diseases: Secondary | ICD-10-CM | POA: Diagnosis not present

## 2019-07-29 ENCOUNTER — Telehealth: Payer: Self-pay | Admitting: *Deleted

## 2019-07-29 NOTE — Telephone Encounter (Signed)
Called pt left msg to Call Back for Saturday Clinic. Or to call back and get a doxy with [facility] for Monday

## 2019-07-29 NOTE — Telephone Encounter (Signed)
No appointment made yet  Call Type Triage / Clinical Relationship To Patient Self Return Phone Number [phone] (Primary) Chief Complaint Diarrhea Reason for Call Symptomatic / Request for [place] states she has had nausea, vomiting and diarrhea that started yesterday a.m. No fever. IBS issues in the past. Translation No Nurse Assessment Nurse: Lavera Guise, RN, Vaughan Basta Date/Time (Eastern Time): 07/28/2019 1:06:40 PM Confirm and document reason for call. If symptomatic, describe symptoms. ---Caller states she has had nausea, vomiting and diarrhea that started yesterday a.m. No fever. IBS issues in the past, 2018; lasted for 9 months. Eventually symptoms resolved. Dicyclamine every 6 hours and not helping; started taking it yesterday a.m.  07/28/2019 1:15:54 PM See PCP within 24 Hours Yes Kluth, RN, Vaughan Basta

## 2019-07-29 NOTE — Telephone Encounter (Signed)
Can the pt be set up for a virtual on Monday? If symptoms worse she needs to be seen at a UC or ED over the weekend

## 2019-08-01 ENCOUNTER — Encounter: Payer: Self-pay | Admitting: Family Medicine

## 2019-08-01 ENCOUNTER — Other Ambulatory Visit: Payer: Self-pay

## 2019-08-01 ENCOUNTER — Ambulatory Visit (INDEPENDENT_AMBULATORY_CARE_PROVIDER_SITE_OTHER): Payer: No Typology Code available for payment source | Admitting: Family Medicine

## 2019-08-01 VITALS — HR 80 | Ht 61.5 in | Wt 249.0 lb

## 2019-08-01 DIAGNOSIS — R1032 Left lower quadrant pain: Secondary | ICD-10-CM | POA: Diagnosis not present

## 2019-08-01 DIAGNOSIS — R197 Diarrhea, unspecified: Secondary | ICD-10-CM

## 2019-08-01 MED ORDER — DICYCLOMINE HCL 20 MG PO TABS: ORAL_TABLET | ORAL | 1 refills | Status: DC

## 2019-08-01 MED ORDER — METRONIDAZOLE 500 MG PO TABS: 500.0000 mg | ORAL_TABLET | Freq: Three times a day (TID) | ORAL | 0 refills | Status: DC

## 2019-08-01 MED ORDER — CIPROFLOXACIN HCL 500 MG PO TABS: 500.0000 mg | ORAL_TABLET | Freq: Two times a day (BID) | ORAL | 0 refills | Status: DC

## 2019-08-01 NOTE — Progress Notes (Signed)
Virtual Visit via Telephone Note  I connected with Tina Hinton on 08/01/19 at  2:20 PM EST by telephone and verified that I am speaking with the correct person using two identifiers.  Location: Patient: home alone  Provider: office    I discussed the limitations, risks, security and privacy concerns of performing an evaluation and management service by telephone and the availability of in person appointments. I also discussed with the patient that there may be a patient responsible charge related to this service. The patient expressed understanding and agreed to proceed.   History of Present Illness: Pt is home c/o Ibs/ diarrhea   She was seen in uc and was covid neg.  She is concerned about her ibs but was dx with diverticulosis   Observations/Objective: Vitals:   08/01/19 1430  Temp: (!) 80 F (26.7 C)   Pt is nad  Assessment and Plan: 1. Left lower quadrant abdominal pain ? Diverticulitis --- check labs  Start abx and check CT If pain worsens-- go to ER  - CBC with Differential/Platelet - Comprehensive metabolic panel - CT Abdomen Pelvis W Contrast; Future  2. Diarrhea, unspecified type See above  - Stool Culture - ciprofloxacin (CIPRO) 500 MG tablet; Take 1 tablet (500 mg total) by mouth 2 (two) times daily.  Dispense: 20 tablet; Refill: 0 - metroNIDAZOLE (FLAGYL) 500 MG tablet; Take 1 tablet (500 mg total) by mouth 3 (three) times daily.  Dispense: 30 tablet; Refill: 0 - dicyclomine (BENTYL) 20 MG tablet; 40 mg qid prn  Dispense: 60 tablet; Refill: 1   Follow Up Instructions:    I discussed the assessment and treatment plan with the patient. The patient was provided an opportunity to ask questions and all were answered. The patient agreed with the plan and demonstrated an understanding of the instructions.   The patient was advised to call back or seek an in-person evaluation if the symptoms worsen or if the condition fails to improve as anticipated.  I  provided 25 minutes of non-face-to-face time during this encounter.   Ann Held, DO

## 2019-08-02 ENCOUNTER — Telehealth: Payer: Self-pay | Admitting: Family Medicine

## 2019-08-02 ENCOUNTER — Other Ambulatory Visit (INDEPENDENT_AMBULATORY_CARE_PROVIDER_SITE_OTHER): Payer: No Typology Code available for payment source

## 2019-08-02 DIAGNOSIS — R1032 Left lower quadrant pain: Secondary | ICD-10-CM

## 2019-08-02 LAB — COMPREHENSIVE METABOLIC PANEL
ALT: 18 U/L (ref 0–35)
AST: 19 U/L (ref 0–37)
Albumin: 4.2 g/dL (ref 3.5–5.2)
Alkaline Phosphatase: 77 U/L (ref 39–117)
BUN: 16 mg/dL (ref 6–23)
CO2: 31 mEq/L (ref 19–32)
Calcium: 9.4 mg/dL (ref 8.4–10.5)
Chloride: 104 mEq/L (ref 96–112)
Creatinine, Ser: 1 mg/dL (ref 0.40–1.20)
GFR: 55.8 mL/min — ABNORMAL LOW (ref >60.00)
Glucose, Bld: 93 mg/dL (ref 70–99)
Potassium: 4.7 mEq/L (ref 3.5–5.1)
Sodium: 141 mEq/L (ref 135–145)
Total Bilirubin: 0.2 mg/dL (ref 0.2–1.2)
Total Protein: 6.7 g/dL (ref 6.0–8.3)

## 2019-08-02 LAB — CBC WITH DIFFERENTIAL/PLATELET
Basophils Absolute: 0.1 10*3/uL (ref 0.0–0.1)
Basophils Relative: 0.7 % (ref 0.0–3.0)
Eosinophils Absolute: 0.4 10*3/uL (ref 0.0–0.7)
Eosinophils Relative: 5.2 % — ABNORMAL HIGH (ref 0.0–5.0)
HCT: 38.8 % (ref 36.0–46.0)
Hemoglobin: 12.8 g/dL (ref 12.0–15.0)
Lymphocytes Relative: 31.3 % (ref 12.0–46.0)
Lymphs Abs: 2.3 10*3/uL (ref 0.7–4.0)
MCHC: 32.9 g/dL (ref 30.0–36.0)
MCV: 90.2 fl (ref 78.0–100.0)
Monocytes Absolute: 0.6 10*3/uL (ref 0.1–1.0)
Monocytes Relative: 8.8 % (ref 3.0–12.0)
Neutro Abs: 3.9 10*3/uL (ref 1.4–7.7)
Neutrophils Relative %: 54 % (ref 43.0–77.0)
Platelets: 246 10*3/uL (ref 150.0–400.0)
RBC: 4.31 Mil/uL (ref 3.87–5.11)
RDW: 13.8 % (ref 11.5–15.5)
WBC: 7.2 10*3/uL (ref 4.0–10.5)

## 2019-08-02 NOTE — Telephone Encounter (Signed)
Patient dropped off paper work to add to chart . Patient coivd results, she would like to be scanned into chart .

## 2019-08-02 NOTE — Addendum Note (Signed)
Addended by: Kelle Darting A on: 08/02/2019 09:11 AM   Modules accepted: Orders

## 2019-08-03 ENCOUNTER — Other Ambulatory Visit: Payer: No Typology Code available for payment source

## 2019-08-03 ENCOUNTER — Other Ambulatory Visit: Payer: Self-pay

## 2019-08-03 DIAGNOSIS — R197 Diarrhea, unspecified: Secondary | ICD-10-CM

## 2019-08-03 NOTE — Telephone Encounter (Signed)
Received. Placed in scan

## 2019-08-05 ENCOUNTER — Other Ambulatory Visit: Payer: Self-pay

## 2019-08-05 ENCOUNTER — Ambulatory Visit (HOSPITAL_BASED_OUTPATIENT_CLINIC_OR_DEPARTMENT_OTHER)
Admission: RE | Admit: 2019-08-05 | Discharge: 2019-08-05 | Disposition: A | Discharge status: Home / Self Care | Payer: No Typology Code available for payment source | Source: Ambulatory Visit | Attending: Family Medicine | Admitting: Family Medicine

## 2019-08-05 ENCOUNTER — Encounter (HOSPITAL_BASED_OUTPATIENT_CLINIC_OR_DEPARTMENT_OTHER): Payer: Self-pay

## 2019-08-05 DIAGNOSIS — R1032 Left lower quadrant pain: Secondary | ICD-10-CM

## 2019-08-05 MED ORDER — IOHEXOL 300 MG/ML  SOLN
100.0000 mL | Freq: Once | INTRAMUSCULAR | Status: AC | PRN
  Administered 2019-08-05: 100 mL via INTRAVENOUS

## 2019-08-07 LAB — STOOL CULTURE
MICRO NUMBER:: 10183601
MICRO NUMBER:: 10183602
MICRO NUMBER:: 10183603
SHIGA RESULT:: NOT DETECTED
SPECIMEN QUALITY:: ADEQUATE
SPECIMEN QUALITY:: ADEQUATE
SPECIMEN QUALITY:: ADEQUATE

## 2019-08-14 ENCOUNTER — Other Ambulatory Visit: Payer: Self-pay | Admitting: Family Medicine

## 2019-08-14 DIAGNOSIS — M170 Bilateral primary osteoarthritis of knee: Secondary | ICD-10-CM

## 2019-10-31 ENCOUNTER — Other Ambulatory Visit: Payer: Self-pay | Admitting: Family Medicine

## 2019-10-31 DIAGNOSIS — J209 Acute bronchitis, unspecified: Secondary | ICD-10-CM

## 2020-01-30 ENCOUNTER — Telehealth: Payer: Self-pay | Admitting: Family Medicine

## 2020-01-30 NOTE — Telephone Encounter (Signed)
Please ask pt about them

## 2020-01-30 NOTE — Telephone Encounter (Signed)
Zofran has not been filled since 2020 and Crestor has not been filled since 2018. Please advise

## 2020-01-30 NOTE — Telephone Encounter (Signed)
Medication: ondansetron (ZOFRAN) 4 MG tablet [[id number]]   rosuvastatin (CRESTOR) 20 MG tablet     Has the patient contacted their pharmacy? No. (If no, request that the patient contact the pharmacy for the refill.) (If yes, when and what did the pharmacy advise?)  Preferred Pharmacy (with phone number or street name):  [id number] - [place], [place] - [address], [address] AT [place] Agent: Please be advised that RX refills may take up to 3 business days. We ask that you follow-up with your pharmacy.

## 2020-01-31 ENCOUNTER — Encounter: Payer: Self-pay | Admitting: Family Medicine

## 2020-01-31 ENCOUNTER — Ambulatory Visit: Payer: No Typology Code available for payment source | Admitting: Family Medicine

## 2020-01-31 ENCOUNTER — Other Ambulatory Visit: Payer: Self-pay

## 2020-01-31 VITALS — BP 126/100 | HR 75 | Temp 98.3°F | Resp 18 | Ht 61.5 in | Wt 252.4 lb

## 2020-01-31 DIAGNOSIS — E785 Hyperlipidemia, unspecified: Secondary | ICD-10-CM | POA: Diagnosis not present

## 2020-01-31 DIAGNOSIS — R42 Dizziness and giddiness: Secondary | ICD-10-CM | POA: Diagnosis not present

## 2020-01-31 DIAGNOSIS — R110 Nausea: Secondary | ICD-10-CM

## 2020-01-31 MED ORDER — ONDANSETRON HCL 4 MG PO TABS: 4.0000 mg | ORAL_TABLET | Freq: Three times a day (TID) | ORAL | 0 refills | Status: DC | PRN

## 2020-01-31 MED ORDER — ROSUVASTATIN CALCIUM 20 MG PO TABS: 20.0000 mg | ORAL_TABLET | Freq: Every day | ORAL | 1 refills | Status: DC

## 2020-01-31 NOTE — Progress Notes (Signed)
Patient ID: Tina Hinton, female    DOB: 11-Apr-1956  Age: 64 y.o. MRN: [id number]    Subjective:  Subjective  HPI Tina Hinton presents for vertigo and f/u from er.  She had dizziness , n/v and sun she went to er and they gave her zofran and antivert which helped ---- but she still has nausea   Review of Systems  Constitutional: Negative for appetite change, diaphoresis, fatigue and unexpected weight change.  Eyes: Negative for pain, redness and visual disturbance.  Respiratory: Negative for cough, chest tightness, shortness of breath and wheezing.   Cardiovascular: Negative for chest pain, palpitations and leg swelling.  Gastrointestinal: Positive for nausea. Negative for vomiting.  Endocrine: Negative for cold intolerance, heat intolerance, polydipsia, polyphagia and polyuria.  Genitourinary: Negative for difficulty urinating, dysuria and frequency.  Neurological: Positive for dizziness. Negative for light-headedness, numbness and headaches.    History Past Medical History:  Diagnosis Date  . Ankle fracture    Right  . Anxiety   . Arthritis    hands, knees  . Asthma   . Chicken pox   . Clavicle fracture    Left  . Depression   . Foot fracture, left   . GERD (gastroesophageal reflux disease)   . Hyperlipidemia   . IBS (irritable bowel syndrome) 01/19/2017  . Seasonal allergies   . Uterine polyp    ablation    She has a past surgical history that includes Tonsillectomy; Mandible surgery; Cesarean section; Tubal ligation; Endometrial ablation; Colonoscopy; BIOSPY; and Upper gastrointestinal endoscopy.   Her family history includes Arthritis in her maternal grandmother and mother; Breast cancer in her maternal aunt and paternal grandmother; Breast cancer (age of onset: 3) in her mother; Diabetes in her maternal grandmother; Heart disease in her maternal grandmother; Prostate cancer in her paternal grandfather.She reports that she has never smoked. She has never  used smokeless tobacco. She reports current alcohol use. She reports that she does not use drugs.  Current Outpatient Medications on File Prior to Visit  Medication Sig Dispense Refill  . albuterol (VENTOLIN HFA) 108 (90 Base) MCG/ACT inhaler TAKE 2 PUFFS BY MOUTH 4 TIMES A DAY AS NEEDED 6.7 g 2  . diclofenac (VOLTAREN) 75 MG EC tablet TAKE 1 TABLET BY MOUTH TWICE A DAY 180 tablet 1  . diclofenac sodium (VOLTAREN) 1 % GEL Apply topically 4 (four) times daily.    Marland Kitchen dicyclomine (BENTYL) 20 MG tablet 40 mg qid prn 60 tablet 1  . fenofibrate 160 MG tablet TAKE 1 TABLET (160 MG TOTAL) BY MOUTH DAILY. 90 tablet 1  . lansoprazole (PREVACID) 15 MG capsule Take 15 mg by mouth daily at 12 noon.    . Loratadine (CLARITIN PO) Take by mouth. Reported on 10/11/2015    . meclizine (ANTIVERT) 25 MG tablet Take by mouth.    Marland Kitchen PARoxetine (PAXIL) 10 MG tablet Take 1 tablet (10 mg total) by mouth daily. 90 tablet 4  . promethazine (PHENERGAN) 12.5 MG tablet     . Triamcinolone Acetonide (TRIAMCINOLONE 0.1 % CREAM : EUCERIN) CREA Apply 1 application topically 3 (three) times daily as needed for rash or itching. 1 each 3   No current facility-administered medications on file prior to visit.     Objective:  Objective  Physical Exam Vitals and nursing note reviewed.  Constitutional:      Appearance: She is well-developed.  HENT:     Head: Normocephalic and atraumatic.     Right Ear: Tympanic membrane normal.  Left Ear: Tympanic membrane normal.     Nose: Nose normal.  Eyes:     Conjunctiva/sclera: Conjunctivae normal.  Neck:     Thyroid: No thyromegaly.     Vascular: No carotid bruit or JVD.  Cardiovascular:     Rate and Rhythm: Normal rate and regular rhythm.     Heart sounds: Normal heart sounds. No murmur heard.   Pulmonary:     Effort: Pulmonary effort is normal. No respiratory distress.     Breath sounds: Normal breath sounds. No wheezing or rales.  Chest:     Chest wall: No tenderness.    Musculoskeletal:     Cervical back: Normal range of motion and neck supple.  Neurological:     Mental Status: She is alert and oriented to person, place, and time.    BP (!) 126/100 (BP Location: Right Arm, Patient Position: Sitting, Cuff Size: Large)   Pulse 75   Temp 98.3 F (36.8 C) (Oral)   Resp 18   Ht 5' 1.5" (1.562 m)   Wt 252 lb 6.4 oz (114.5 kg)   SpO2 97%   BMI 46.92 kg/m  Wt Readings from Last 3 Encounters:  01/31/20 252 lb 6.4 oz (114.5 kg)  08/01/19 249 lb (112.9 kg)  03/23/19 254 lb (115.2 kg)     Lab Results  Component Value Date   WBC 7.2 08/02/2019   HGB 12.8 08/02/2019   HCT 38.8 08/02/2019   PLT 246.0 08/02/2019   GLUCOSE 93 08/02/2019   CHOL 179 01/20/2017   TRIG 171.0 (H) 01/20/2017   HDL 41.90 01/20/2017   LDLDIRECT 155.0 06/25/2015   LDLCALC 103 (H) 01/20/2017   ALT 18 08/02/2019   AST 19 08/02/2019   NA 141 08/02/2019   K 4.7 08/02/2019   CL 104 08/02/2019   CREATININE 1.00 08/02/2019   BUN 16 08/02/2019   CO2 31 08/02/2019   TSH 1.74 01/20/2017    CT Abdomen Pelvis W Contrast  Result Date: 08/06/2019 CLINICAL DATA:  Left lower quadrant pain. EXAM: CT ABDOMEN AND PELVIS WITH CONTRAST TECHNIQUE: Multidetector CT imaging of the abdomen and pelvis was performed using the standard protocol following bolus administration of intravenous contrast. CONTRAST:  121mL OMNIPAQUE IOHEXOL 300 MG/ML  SOLN COMPARISON:  02/15/2018 FINDINGS: Lower chest:  Unremarkable. Hepatobiliary: No suspicious focal abnormality within the liver parenchyma. There is no evidence for gallstones, gallbladder wall thickening, or pericholecystic fluid. No intrahepatic or extrahepatic biliary dilation. Pancreas: No focal mass lesion. No dilatation of the main duct. No intraparenchymal cyst. No peripancreatic edema. Spleen: No splenomegaly. No focal mass lesion. Adrenals/Urinary Tract: No adrenal nodule or mass. Right kidney unremarkable. Central sinus cysts noted inferior left  kidney. No evidence for hydroureter. The urinary bladder appears normal for the degree of distention. Stomach/Bowel: Small hiatal hernia. Stomach otherwise unremarkable. Duodenum is normally positioned as is the ligament of Treitz. Duodenal diverticulum evident. No small bowel wall thickening. No small bowel dilatation. The terminal ileum is normal. No gross colonic mass. No colonic wall thickening. Diverticular changes are noted in the left colon without evidence of diverticulitis. Vascular/Lymphatic: There is abdominal aortic atherosclerosis without aneurysm. There is no gastrohepatic or hepatoduodenal ligament lymphadenopathy. No retroperitoneal or mesenteric lymphadenopathy. No pelvic sidewall lymphadenopathy. Reproductive: The uterus is unremarkable. Left ovary somewhat prominent but completely stable in appearance since the prior study and also comparing back to 11/05/2016 consistent with benign finding. No right adnexal mass. Other: No intraperitoneal free fluid. Musculoskeletal: No worrisome lytic or sclerotic osseous  abnormality. IMPRESSION: 1. No acute findings in the abdomen or pelvis. Specifically, no findings to explain the patient's history of left lower quadrant pain. 2. Left colonic diverticulosis without diverticulitis. 3. Aortic Atherosclerosis (ICD10-I70.0). Electronically Signed   By: Misty Stanley M.D.   On: 08/06/2019 08:39     Assessment & Plan:  Plan  I have discontinued Winnifred Swaim's ciprofloxacin and metroNIDAZOLE. I am also having her maintain her Loratadine (CLARITIN PO), triamcinolone 0.1 % cream : eucerin, fenofibrate, diclofenac sodium, lansoprazole, PARoxetine, promethazine, dicyclomine, diclofenac, albuterol, meclizine, ondansetron, and rosuvastatin.  Meds ordered this encounter  Medications  . ondansetron (ZOFRAN) 4 MG tablet    Sig: Take 1 tablet (4 mg total) by mouth every 8 (eight) hours as needed for nausea or vomiting.    Dispense:  20 tablet    Refill:  0    . DISCONTD: rosuvastatin (CRESTOR) 20 MG tablet    Sig: Take 1 tablet (20 mg total) by mouth at bedtime.    Dispense:  90 tablet    Refill:  1  . rosuvastatin (CRESTOR) 20 MG tablet    Sig: Take 1 tablet (20 mg total) by mouth at bedtime.    Dispense:  90 tablet    Refill:  1    Problem List Items Addressed This Visit      Unprioritized   Hyperlipidemia LDL goal <100    Tolerating statin, encouraged heart healthy diet, avoid trans fats, minimize simple carbs and saturated fats. Increase exercise as tolerated Has been off of it because she ran out      Relevant Medications   rosuvastatin (CRESTOR) 20 MG tablet   Other Relevant Orders   Lipid panel   Comprehensive metabolic panel   CBC with Differential/Platelet   Vertigo - Primary    con't meclizine and zofran Referral for ent  epley manuver ho given to pt  Er visit reviewed , imaging and labs       Relevant Orders   Comprehensive metabolic panel   CBC with Differential/Platelet   TSH   Vitamin B12   Ambulatory referral to ENT    Other Visit Diagnoses    Nausea       Relevant Medications   ondansetron (ZOFRAN) 4 MG tablet      Follow-up: Return in about 3 months (around 05/02/2020), or if symptoms worsen or fail to improve, for annual exam, fasting.  Ann Held, DO

## 2020-01-31 NOTE — Assessment & Plan Note (Signed)
Tolerating statin, encouraged heart healthy diet, avoid trans fats, minimize simple carbs and saturated fats. Increase exercise as tolerated Has been off of it because she ran out

## 2020-01-31 NOTE — Assessment & Plan Note (Addendum)
con't meclizine and zofran Referral for ent  epley manuver ho given to pt  Er visit reviewed , imaging and labs

## 2020-01-31 NOTE — Patient Instructions (Signed)

## 2020-01-31 NOTE — Telephone Encounter (Signed)
Spoke with patient. Pt has a visit today and will discuss at visit

## 2020-02-01 LAB — CBC WITH DIFFERENTIAL/PLATELET
Absolute Monocytes: 781 cells/uL (ref 200–950)
Basophils Absolute: 50 cells/uL (ref 0–200)
Basophils Relative: 0.6 %
Eosinophils Absolute: 218 cells/uL (ref 15–500)
Eosinophils Relative: 2.6 %
HCT: 40.5 % (ref 35.0–45.0)
Hemoglobin: 13.2 g/dL (ref 11.7–15.5)
Lymphs Abs: 2570 cells/uL (ref 850–3900)
MCH: 29.8 pg (ref 27.0–33.0)
MCHC: 32.6 g/dL (ref 32.0–36.0)
MCV: 91.4 fL (ref 80.0–100.0)
MPV: 11.8 fL (ref 7.5–12.5)
Monocytes Relative: 9.3 %
Neutro Abs: 4780 cells/uL (ref 1500–7800)
Neutrophils Relative %: 56.9 %
Platelets: 240 10*3/uL (ref 140–400)
RBC: 4.43 10*6/uL (ref 3.80–5.10)
RDW: 13.1 % (ref 11.0–15.0)
Total Lymphocyte: 30.6 %
WBC: 8.4 10*3/uL (ref 3.8–10.8)

## 2020-02-01 LAB — COMPREHENSIVE METABOLIC PANEL
AG Ratio: 1.6 (calc) (ref 1.0–2.5)
ALT: 22 U/L (ref 6–29)
AST: 22 U/L (ref 10–35)
Albumin: 4.1 g/dL (ref 3.6–5.1)
Alkaline phosphatase (APISO): 83 U/L (ref 37–153)
BUN: 15 mg/dL (ref 7–25)
CO2: 26 mmol/L (ref 20–32)
Calcium: 9.1 mg/dL (ref 8.6–10.4)
Chloride: 104 mmol/L (ref 98–110)
Creat: 0.75 mg/dL (ref 0.50–0.99)
Globulin: 2.6 g/dL (calc) (ref 1.9–3.7)
Glucose, Bld: 76 mg/dL (ref 65–99)
Potassium: 4.8 mmol/L (ref 3.5–5.3)
Sodium: 142 mmol/L (ref 135–146)
Total Bilirubin: 0.3 mg/dL (ref 0.2–1.2)
Total Protein: 6.7 g/dL (ref 6.1–8.1)

## 2020-02-01 LAB — LIPID PANEL
Cholesterol: 219 mg/dL — ABNORMAL HIGH (ref <200)
HDL: 41 mg/dL — ABNORMAL LOW (ref >=50)
LDL Cholesterol (Calc): 140 mg/dL (calc) — ABNORMAL HIGH
Non-HDL Cholesterol (Calc): 178 mg/dL (calc) — ABNORMAL HIGH (ref <130)
Total CHOL/HDL Ratio: 5.3 (calc) — ABNORMAL HIGH (ref <5.0)
Triglycerides: 233 mg/dL — ABNORMAL HIGH (ref <150)

## 2020-02-01 LAB — VITAMIN B12: Vitamin B-12: 302 pg/mL (ref 200–1100)

## 2020-02-01 LAB — TSH: TSH: 1.52 mIU/L (ref 0.40–4.50)

## 2020-02-04 ENCOUNTER — Other Ambulatory Visit: Payer: Self-pay | Admitting: Family Medicine

## 2020-02-04 DIAGNOSIS — E1169 Type 2 diabetes mellitus with other specified complication: Secondary | ICD-10-CM

## 2020-02-04 DIAGNOSIS — E785 Hyperlipidemia, unspecified: Secondary | ICD-10-CM

## 2020-02-20 ENCOUNTER — Other Ambulatory Visit: Payer: Self-pay | Admitting: Family Medicine

## 2020-02-20 DIAGNOSIS — M170 Bilateral primary osteoarthritis of knee: Secondary | ICD-10-CM

## 2020-05-07 ENCOUNTER — Telehealth: Payer: Self-pay | Admitting: Family Medicine

## 2020-05-07 NOTE — Telephone Encounter (Signed)
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   Please advise

## 2020-05-08 ENCOUNTER — Encounter: Payer: Self-pay | Admitting: Family Medicine

## 2020-05-08 NOTE — Telephone Encounter (Signed)
Note in epic

## 2020-05-08 NOTE — Telephone Encounter (Signed)
Spoke with patient. Pt states her job is wanting her to go between [place] and [place] for work and pt would like to work from home due to her underlying medical conditions and COVID. Pt states she will retire in 20 months and would like to work from home for the rest of the time. Please advise

## 2020-05-08 NOTE — Telephone Encounter (Signed)
We do not usually add the dx due to hipa

## 2020-05-08 NOTE — Telephone Encounter (Signed)
Spoke with patient. Pt would like to know if we can add her dx to her note. She would like asthma and vestibular migraines added to the note. The ENT visit is in [place] back in September

## 2020-05-10 ENCOUNTER — Encounter: Payer: Self-pay | Admitting: Family Medicine

## 2020-05-16 ENCOUNTER — Encounter: Payer: Self-pay | Admitting: Family Medicine

## 2020-05-16 ENCOUNTER — Other Ambulatory Visit: Payer: Self-pay

## 2020-05-16 ENCOUNTER — Telehealth (INDEPENDENT_AMBULATORY_CARE_PROVIDER_SITE_OTHER): Payer: No Typology Code available for payment source | Admitting: Family Medicine

## 2020-05-16 DIAGNOSIS — J209 Acute bronchitis, unspecified: Secondary | ICD-10-CM

## 2020-05-16 DIAGNOSIS — J4540 Moderate persistent asthma, uncomplicated: Secondary | ICD-10-CM | POA: Diagnosis not present

## 2020-05-16 DIAGNOSIS — J45909 Unspecified asthma, uncomplicated: Secondary | ICD-10-CM | POA: Insufficient documentation

## 2020-05-16 MED ORDER — QVAR REDIHALER 40 MCG/ACT IN AERB: 2.0000 | INHALATION_SPRAY | Freq: Two times a day (BID) | RESPIRATORY_TRACT | 2 refills | Status: DC

## 2020-05-16 MED ORDER — ALBUTEROL SULFATE HFA 108 (90 BASE) MCG/ACT IN AERS: INHALATION_SPRAY | RESPIRATORY_TRACT | 2 refills | Status: DC

## 2020-05-16 NOTE — Progress Notes (Signed)
Virtual Visit via Video Note  I connected with Tina Hinton on 05/16/20 at  8:00 AM EST by a video enabled telemedicine application and verified that I am speaking with the correct person using two identifiers.  Location: Patient: home alone  Provider: home    I discussed the limitations of evaluation and management by telemedicine and the availability of in person appointments. The patient expressed understanding and agreed to proceed.  History of Present Illness: Pt is home requesting accomodation for be filled out to work from home due to her asthma    She has had to travel a lot and she is having a lot more exacerbations and using her albuterol a lot more often   She would like to be able to work from home due to covid   Observations/Objective: There were no vitals filed for this visit. Pt is in nad   Assessment and Plan: 1. Moderate persistent asthma, unspecified whether complicated Start qvar 2 puffs bid and refill albuterol  F/u prn  - beclomethasone (QVAR REDIHALER) 40 MCG/ACT inhaler; Inhale 2 puffs into the lungs 2 (two) times daily.  Dispense: 1 each; Refill: 2  - albuterol (VENTOLIN HFA) 108 (90 Base) MCG/ACT inhaler; TAKE 2 PUFFS BY MOUTH 4 TIMES A DAY AS NEEDED  Dispense: 6.7 g; Refill: 2   Follow Up Instructions:    I discussed the assessment and treatment plan with the patient. The patient was provided an opportunity to ask questions and all were answered. The patient agreed with the plan and demonstrated an understanding of the instructions.   The patient was advised to call back or seek an in-person evaluation if the symptoms worsen or if the condition fails to improve as anticipated.  I provided 25 minutes of non-face-to-face time during this encounter.   Ann Held, DO

## 2020-09-06 ENCOUNTER — Ambulatory Visit: Payer: Federal, State, Local not specified - PPO | Admitting: Family Medicine

## 2020-09-06 ENCOUNTER — Other Ambulatory Visit: Payer: Self-pay

## 2020-09-06 VITALS — BP 128/62 | HR 68 | Temp 98.0°F | Resp 18 | Ht 61.5 in | Wt 253.0 lb

## 2020-09-06 DIAGNOSIS — I700 Atherosclerosis of aorta: Secondary | ICD-10-CM

## 2020-09-06 DIAGNOSIS — J4550 Severe persistent asthma, uncomplicated: Secondary | ICD-10-CM | POA: Diagnosis not present

## 2020-09-12 ENCOUNTER — Encounter: Payer: Self-pay | Admitting: Family Medicine

## 2020-09-12 NOTE — Assessment & Plan Note (Signed)
con't albuterol and qvar  rto prn

## 2020-09-12 NOTE — Assessment & Plan Note (Signed)
con't crestor and fenofibrate

## 2020-09-12 NOTE — Progress Notes (Signed)
Subjective:    Patient ID: Tina Hinton, female    DOB: 04/22/1956, 65 y.o.   MRN: [id number]  Chief Complaint  Patient presents with  . Follow-up    -tlc,rma  . Asthma    Using beclomethasone (QVAR REDIHALER) 40 MCG/ACT inhaler; Inhale 2 puffs into the lungs 2 (two) times daily.   Also using albuterol (VENTOLIN HFA) 108 (90 Base) MCG/ACT inhaler as needed.   . Rash    On L wrist. Started Monday. This was itchy but is better some.     HPI Patient is in today for asthma exacerbation----  She is using qvar 40 mg and albuterol.   She also c/o rash on her wrist.    Past Medical History:  Diagnosis Date  . Ankle fracture    Right  . Anxiety   . Arthritis    hands, knees  . Asthma   . Chicken pox   . Clavicle fracture    Left  . Depression   . Foot fracture, left   . GERD (gastroesophageal reflux disease)   . Hyperlipidemia   . IBS (irritable bowel syndrome) 01/19/2017  . Seasonal allergies   . Uterine polyp    ablation    Past Surgical History:  Procedure Laterality Date  . BIOSPY     HEAD  . CESAREAN SECTION    . COLONOSCOPY    . ENDOMETRIAL ABLATION    . MANDIBLE SURGERY    . TONSILLECTOMY    . TUBAL LIGATION    . UPPER GASTROINTESTINAL ENDOSCOPY      Family History  Problem Relation Age of Onset  . Arthritis Mother   . Breast cancer Mother 47  . Arthritis Maternal Grandmother   . Heart disease Maternal Grandmother   . Diabetes Maternal Grandmother   . Breast cancer Maternal Aunt   . Breast cancer Paternal Grandmother   . Prostate cancer Paternal Grandfather   . Colon cancer Neg Hx   . Esophageal cancer Neg Hx   . Rectal cancer Neg Hx   . Stomach cancer Neg Hx     Social History   Socioeconomic History  . Marital status: Divorced    Spouse name: Not on file  . Number of children: Not on file  . Years of education: Not on file  . Highest education level: Not on file  Occupational History  . Not on file  Tobacco Use  . Smoking status:  Never Smoker  . Smokeless tobacco: Never Used  Vaping Use  . Vaping Use: Never used  Substance and Sexual Activity  . Alcohol use: Yes    Alcohol/week: 0.0 standard drinks    Comment: occ  . Drug use: No  . Sexual activity: Not on file  Other Topics Concern  . Not on file  Social History Narrative   No regular exercise.   Social Determinants of Health   Financial Resource Strain: Not on file  Food Insecurity: Not on file  Transportation Needs: Not on file  Physical Activity: Not on file  Stress: Not on file  Social Connections: Not on file  Intimate Partner Violence: Not on file    Outpatient Medications Prior to Visit  Medication Sig Dispense Refill  . albuterol (VENTOLIN HFA) 108 (90 Base) MCG/ACT inhaler TAKE 2 PUFFS BY MOUTH 4 TIMES A DAY AS NEEDED 6.7 g 2  . beclomethasone (QVAR REDIHALER) 40 MCG/ACT inhaler Inhale 2 puffs into the lungs 2 (two) times daily. 1 each 2  . diclofenac (VOLTAREN)  75 MG EC tablet TAKE 1 TABLET BY MOUTH TWICE A DAY 180 tablet 1  . diclofenac sodium (VOLTAREN) 1 % GEL Apply topically 4 (four) times daily.    Marland Kitchen dicyclomine (BENTYL) 20 MG tablet 40 mg qid prn 60 tablet 1  . fenofibrate 160 MG tablet TAKE 1 TABLET (160 MG TOTAL) BY MOUTH DAILY. 90 tablet 1  . lansoprazole (PREVACID) 15 MG capsule Take 15 mg by mouth daily at 12 noon.    . Loratadine (CLARITIN PO) Take by mouth. Reported on 10/11/2015    . meclizine (ANTIVERT) 25 MG tablet Take by mouth.    . ondansetron (ZOFRAN) 4 MG tablet Take 1 tablet (4 mg total) by mouth every 8 (eight) hours as needed for nausea or vomiting. 20 tablet 0  . PARoxetine (PAXIL) 10 MG tablet Take 1 tablet (10 mg total) by mouth daily. 90 tablet 4  . promethazine (PHENERGAN) 12.5 MG tablet     . rosuvastatin (CRESTOR) 20 MG tablet Take 1 tablet (20 mg total) by mouth at bedtime. 90 tablet 1  . Triamcinolone Acetonide (TRIAMCINOLONE 0.1 % CREAM : EUCERIN) CREA Apply 1 application topically 3 (three) times daily as  needed for rash or itching. 1 each 3   No facility-administered medications prior to visit.    Allergies  Allergen Reactions  . Phenylephrine-Guaifenesin Rash    ROS     Objective:    Physical Exam  BP 128/62 (BP Location: Right Arm, Patient Position: Sitting, Cuff Size: Large)   Pulse 68   Temp 98 F (36.7 C) (Oral)   Resp 18   Ht 5' 1.5" (1.562 m)   Wt 253 lb (114.8 kg)   SpO2 98%   BMI 47.03 kg/m  Wt Readings from Last 3 Encounters:  09/06/20 253 lb (114.8 kg)  01/31/20 252 lb 6.4 oz (114.5 kg)  08/01/19 249 lb (112.9 kg)    Diabetic Foot Exam - Simple   No data filed    Lab Results  Component Value Date   WBC 8.4 01/31/2020   HGB 13.2 01/31/2020   HCT 40.5 01/31/2020   PLT 240 01/31/2020   GLUCOSE 76 01/31/2020   CHOL 219 (H) 01/31/2020   TRIG 233 (H) 01/31/2020   HDL 41 (L) 01/31/2020   LDLDIRECT 155.0 06/25/2015   LDLCALC 140 (H) 01/31/2020   ALT 22 01/31/2020   AST 22 01/31/2020   NA 142 01/31/2020   K 4.8 01/31/2020   CL 104 01/31/2020   CREATININE 0.75 01/31/2020   BUN 15 01/31/2020   CO2 26 01/31/2020   TSH 1.52 01/31/2020    Lab Results  Component Value Date   TSH 1.52 01/31/2020   Lab Results  Component Value Date   WBC 8.4 01/31/2020   HGB 13.2 01/31/2020   HCT 40.5 01/31/2020   MCV 91.4 01/31/2020   PLT 240 01/31/2020   Lab Results  Component Value Date   NA 142 01/31/2020   K 4.8 01/31/2020   CO2 26 01/31/2020   GLUCOSE 76 01/31/2020   BUN 15 01/31/2020   CREATININE 0.75 01/31/2020   BILITOT 0.3 01/31/2020   ALKPHOS 77 08/02/2019   AST 22 01/31/2020   ALT 22 01/31/2020   PROT 6.7 01/31/2020   ALBUMIN 4.2 08/02/2019   CALCIUM 9.1 01/31/2020   GFR 55.80 (L) 08/02/2019   Lab Results  Component Value Date   CHOL 219 (H) 01/31/2020   Lab Results  Component Value Date   HDL 41 (L) 01/31/2020  Lab Results  Component Value Date   LDLCALC 140 (H) 01/31/2020   Lab Results  Component Value Date   TRIG 233 (H)  01/31/2020   Lab Results  Component Value Date   CHOLHDL 5.3 (H) 01/31/2020   No results found for: HGBA1C     Assessment & Plan:   Problem List Items Addressed This Visit      Unprioritized   Aortic atherosclerosis ([place])    con't crestor and fenofibrate        Asthma - Primary    con't albuterol and qvar  rto prn       Morbid obesity ([place])    con't diet and exercise          I am having Brunetta Genera maintain her Loratadine (CLARITIN PO), triamcinolone 0.1 % cream : eucerin, fenofibrate, diclofenac sodium, lansoprazole, PARoxetine, promethazine, dicyclomine, meclizine, ondansetron, rosuvastatin, diclofenac, Qvar RediHaler, and albuterol.  No orders of the defined types were placed in this encounter.    Ann Held, DO

## 2020-09-12 NOTE — Assessment & Plan Note (Signed)
con't diet and exercise  

## 2020-11-07 ENCOUNTER — Other Ambulatory Visit: Payer: Self-pay

## 2020-11-07 ENCOUNTER — Ambulatory Visit: Payer: Federal, State, Local not specified - PPO | Admitting: Family Medicine

## 2020-11-07 ENCOUNTER — Ambulatory Visit: Payer: Self-pay

## 2020-11-07 ENCOUNTER — Ambulatory Visit (HOSPITAL_BASED_OUTPATIENT_CLINIC_OR_DEPARTMENT_OTHER)
Admission: RE | Admit: 2020-11-07 | Discharge: 2020-11-07 | Disposition: A | Discharge status: Home / Self Care | Payer: Federal, State, Local not specified - PPO | Source: Ambulatory Visit | Attending: Family Medicine | Admitting: Family Medicine

## 2020-11-07 VITALS — BP 126/80 | Ht 61.5 in | Wt 253.0 lb

## 2020-11-07 DIAGNOSIS — S83412A Sprain of medial collateral ligament of left knee, initial encounter: Secondary | ICD-10-CM | POA: Diagnosis not present

## 2020-11-07 DIAGNOSIS — M25562 Pain in left knee: Secondary | ICD-10-CM | POA: Diagnosis not present

## 2020-11-07 NOTE — Patient Instructions (Signed)
Nice to meet you  Please try ice  Please try the brace Please try the exercises  I will call with the results from today  Please send me a message in MyChart with any questions or updates.  Please see me back in 4 weeks.   --Dr. Raeford Razor

## 2020-11-07 NOTE — Progress Notes (Signed)
Leetta Hendriks - 65 y.o. female MRN [id number]  Date of birth: 05/09/1956  SUBJECTIVE:  Including CC & ROS.  No chief complaint on file.   Brandilynn Taormina is a 65 y.o. female that is presenting with left knee pain.  She had an injury in [place] when she fell onto her knee.  Has been using a rolling walker and taking medication since that time.   Review of Systems See HPI   HISTORY: Past Medical, Surgical, Social, and Family History Reviewed & Updated per EMR.   Pertinent Historical Findings include:  Past Medical History:  Diagnosis Date  . Ankle fracture    Right  . Anxiety   . Arthritis    hands, knees  . Asthma   . Chicken pox   . Clavicle fracture    Left  . Depression   . Foot fracture, left   . GERD (gastroesophageal reflux disease)   . Hyperlipidemia   . IBS (irritable bowel syndrome) 01/19/2017  . Seasonal allergies   . Uterine polyp    ablation    Past Surgical History:  Procedure Laterality Date  . BIOSPY     HEAD  . CESAREAN SECTION    . COLONOSCOPY    . ENDOMETRIAL ABLATION    . MANDIBLE SURGERY    . TONSILLECTOMY    . TUBAL LIGATION    . UPPER GASTROINTESTINAL ENDOSCOPY      Family History  Problem Relation Age of Onset  . Arthritis Mother   . Breast cancer Mother 71  . Arthritis Maternal Grandmother   . Heart disease Maternal Grandmother   . Diabetes Maternal Grandmother   . Breast cancer Maternal Aunt   . Breast cancer Paternal Grandmother   . Prostate cancer Paternal Grandfather   . Colon cancer Neg Hx   . Esophageal cancer Neg Hx   . Rectal cancer Neg Hx   . Stomach cancer Neg Hx     Social History   Socioeconomic History  . Marital status: Divorced    Spouse name: Not on file  . Number of children: Not on file  . Years of education: Not on file  . Highest education level: Not on file  Occupational History  . Not on file  Tobacco Use  . Smoking status: Never Smoker  . Smokeless tobacco: Never Used  Vaping Use  .  Vaping Use: Never used  Substance and Sexual Activity  . Alcohol use: Yes    Alcohol/week: 0.0 standard drinks    Comment: occ  . Drug use: No  . Sexual activity: Not on file  Other Topics Concern  . Not on file  Social History Narrative   No regular exercise.   Social Determinants of Health   Financial Resource Strain: Not on file  Food Insecurity: Not on file  Transportation Needs: Not on file  Physical Activity: Not on file  Stress: Not on file  Social Connections: Not on file  Intimate Partner Violence: Not on file     PHYSICAL EXAM:  VS: BP 126/80   Ht 5' 1.5" (1.562 m)   Wt 253 lb (114.8 kg)   BMI 47.03 kg/m  Physical Exam Gen: NAD, alert, cooperative with exam, well-appearing MSK:  Left knee: Normal range of motion. Normal strength resistance neurovascular intact  Limited ultrasound: Left knee:  No effusion of the suprapatellar pouch. Normal-appearing quadricep and patellar tendon. Moderate medial joint space narrowing but no hyperemia. Degenerative changes of the lateral meniscus and joint space. Hyperemia appreciated and  hypoechoic change at the [facility] origin. No Baker's cyst  Summary: Findings consistent with MCL sprain  Ultrasound and interpretation by Clearance Coots, MD    ASSESSMENT & PLAN:   MCL sprain of left knee Head injury on 5/8.  Exam seems most consistent with MCL sprain. -Counseled on home exercise therapy and supportive care. -Hinged knee brace. -X-ray. -Could consider physical therapy.  Morbid obesity ([place]) She would like to have other options and help with coming up with a plan for weight loss. -Referral to healthy weight and wellness.

## 2020-11-08 ENCOUNTER — Telehealth: Payer: Self-pay | Admitting: Family Medicine

## 2020-11-08 NOTE — Assessment & Plan Note (Signed)
Head injury on 5/8.  Exam seems most consistent with MCL sprain. -Counseled on home exercise therapy and supportive care. -Hinged knee brace. -X-ray. -Could consider physical therapy.

## 2020-11-08 NOTE — Assessment & Plan Note (Signed)
She would like to have other options and help with coming up with a plan for weight loss. -Referral to healthy weight and wellness.

## 2020-11-08 NOTE — Telephone Encounter (Signed)
Informed on results.   Rosemarie Ax, MD [facility] 11/08/2020, 2:46 PM

## 2020-11-16 ENCOUNTER — Other Ambulatory Visit: Payer: Self-pay

## 2020-11-16 ENCOUNTER — Ambulatory Visit (INDEPENDENT_AMBULATORY_CARE_PROVIDER_SITE_OTHER): Payer: Federal, State, Local not specified - PPO | Admitting: Family Medicine

## 2020-11-16 ENCOUNTER — Encounter: Payer: Self-pay | Admitting: Family Medicine

## 2020-11-16 VITALS — BP 110/82 | HR 77 | Temp 98.1°F | Resp 20 | Ht 61.5 in | Wt 257.8 lb

## 2020-11-16 DIAGNOSIS — E2839 Other primary ovarian failure: Secondary | ICD-10-CM

## 2020-11-16 DIAGNOSIS — F419 Anxiety disorder, unspecified: Secondary | ICD-10-CM

## 2020-11-16 DIAGNOSIS — J209 Acute bronchitis, unspecified: Secondary | ICD-10-CM

## 2020-11-16 DIAGNOSIS — E785 Hyperlipidemia, unspecified: Secondary | ICD-10-CM

## 2020-11-16 DIAGNOSIS — Z0000 Encounter for general adult medical examination without abnormal findings: Secondary | ICD-10-CM | POA: Diagnosis not present

## 2020-11-16 DIAGNOSIS — S83412D Sprain of medial collateral ligament of left knee, subsequent encounter: Secondary | ICD-10-CM

## 2020-11-16 DIAGNOSIS — L309 Dermatitis, unspecified: Secondary | ICD-10-CM

## 2020-11-16 DIAGNOSIS — D229 Melanocytic nevi, unspecified: Secondary | ICD-10-CM

## 2020-11-16 MED ORDER — TRIAMCINOLONE 0.1 % CREAM:EUCERIN CREAM 1:1: 1.0000 "application " | TOPICAL_CREAM | Freq: Three times a day (TID) | CUTANEOUS | 3 refills | Status: DC | PRN

## 2020-11-16 MED ORDER — ALBUTEROL SULFATE HFA 108 (90 BASE) MCG/ACT IN AERS: INHALATION_SPRAY | RESPIRATORY_TRACT | 2 refills | Status: DC

## 2020-11-16 MED ORDER — TRIAMCINOLONE ACETONIDE 0.1 % EX CREA: 1.0000 "application " | TOPICAL_CREAM | Freq: Two times a day (BID) | CUTANEOUS | 0 refills | Status: DC

## 2020-11-16 NOTE — Assessment & Plan Note (Signed)
Healing -- per sport med

## 2020-11-16 NOTE — Patient Instructions (Signed)
Preventive Care 68-65 Years Old, Female Preventive care refers to lifestyle choices and visits with your health care provider that can promote health and wellness. This includes: A yearly physical exam. This is also called an annual wellness visit. Regular dental and eye exams. Immunizations. Screening for certain conditions. Healthy lifestyle choices, such as: Eating a healthy diet. Getting regular exercise. Not using drugs or products that contain nicotine and tobacco. Limiting alcohol use. What can I expect for my preventive care visit? Physical exam Your health care provider will check your: Height and weight. These may be used to calculate your BMI (body mass index). BMI is a measurement that tells if you are at a healthy weight. Heart rate and blood pressure. Body temperature. Skin for abnormal spots. Counseling Your health care provider may ask you questions about your: Past medical problems. Family's medical history. Alcohol, tobacco, and drug use. Emotional well-being. Home life and relationship well-being. Sexual activity. Diet, exercise, and sleep habits. Work and work Statistician. Access to firearms. Method of birth control. Menstrual cycle. Pregnancy history. What immunizations do I need?  Vaccines are usually given at various ages, according to a schedule. Your health care provider will recommend vaccines for you based on your age, medicalhistory, and lifestyle or other factors, such as travel or where you work. What tests do I need? Blood tests Lipid and cholesterol levels. These may be checked every 5 years, or more often if you are over 65 years old. Hepatitis C test. Hepatitis B test. Screening Lung cancer screening. You may have this screening every year starting at age 65 if you have a 30-pack-year history of smoking and currently smoke or have quit within the past 15 years. Colorectal cancer screening. All adults should have this screening starting at  age 65 and continuing until age 65. Your health care provider may recommend screening at age 65 if you are at increased risk. You will have tests every 1-10 years, depending on your results and the type of screening test. Diabetes screening. This is done by checking your blood sugar (glucose) after you have not eaten for a while (fasting). You may have this done every 1-3 years. Mammogram. This may be done every 1-2 years. Talk with your health care provider about when you should start having regular mammograms. This may depend on whether you have a family history of breast cancer. BRCA-related cancer screening. This may be done if you have a family history of breast, ovarian, tubal, or peritoneal cancers. Pelvic exam and Pap test. This may be done every 3 years starting at age 65. Starting at age 65, this may be done every 5 years if you have a Pap test in combination with an HPV test. Other tests STD (sexually transmitted disease) testing, if you are at risk. Bone density scan. This is done to screen for osteoporosis. You may have this scan if you are at high risk for osteoporosis. Talk with your health care provider about your test results, treatment options,and if necessary, the need for more tests. Follow these instructions at home: Eating and drinking  Eat a diet that includes fresh fruits and vegetables, whole grains, lean protein, and low-fat dairy products. Take vitamin and mineral supplements as recommended by your health care provider. Do not drink alcohol if: Your health care provider tells you not to drink. You are pregnant, may be pregnant, or are planning to become pregnant. If you drink alcohol: Limit how much you have to 0-1 drink a day. Be aware  of how much alcohol is in your drink. In the [place] one drink equals one 12 oz bottle of beer (355 mL), one 5 oz glass of wine (148 mL), or one 1 oz glass of hard liquor (44 mL).  Lifestyle Take daily care of your teeth and  gums. Brush your teeth every morning and night with fluoride toothpaste. Floss one time each day. Stay active. Exercise for at least 30 minutes 5 or more days each week. Do not use any products that contain nicotine or tobacco, such as cigarettes, e-cigarettes, and chewing tobacco. If you need help quitting, ask your health care provider. Do not use drugs. If you are sexually active, practice safe sex. Use a condom or other form of protection to prevent STIs (sexually transmitted infections). If you do not wish to become pregnant, use a form of birth control. If you plan to become pregnant, see your health care provider for a prepregnancy visit. If told by your health care provider, take low-dose aspirin daily starting at age 65. Find healthy ways to cope with stress, such as: Meditation, yoga, or listening to music. Journaling. Talking to a trusted person. Spending time with friends and family. Safety Always wear your seat belt while driving or riding in a vehicle. Do not drive: If you have been drinking alcohol. Do not ride with someone who has been drinking. When you are tired or distracted. While texting. Wear a helmet and other protective equipment during sports activities. If you have firearms in your house, make sure you follow all gun safety procedures. What's next? Visit your health care provider once a year for an annual wellness visit. Ask your health care provider how often you should have your eyes and teeth checked. Stay up to date on all vaccines. This information is not intended to replace advice given to you by your health care provider. Make sure you discuss any questions you have with your healthcare provider. Document Revised: 02/28/2020 Document Reviewed: 02/04/2018 Elsevier Patient Education  2022 [employer].

## 2020-11-16 NOTE — Assessment & Plan Note (Signed)
ghm utd Check labs  

## 2020-11-16 NOTE — Assessment & Plan Note (Signed)
Stable con't paxil
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
## 2020-11-16 NOTE — Assessment & Plan Note (Signed)
Encourage heart healthy diet such as MIND or DASH diet, increase exercise, avoid trans fats, simple carbohydrates and processed foods, consider a krill or fish or flaxseed oil cap daily.  °

## 2020-11-17 LAB — COMPREHENSIVE METABOLIC PANEL
AG Ratio: 1.8 (calc) (ref 1.0–2.5)
ALT: 13 U/L (ref 6–29)
AST: 18 U/L (ref 10–35)
Albumin: 4.3 g/dL (ref 3.6–5.1)
Alkaline phosphatase (APISO): 78 U/L (ref 37–153)
BUN: 18 mg/dL (ref 7–25)
CO2: 28 mmol/L (ref 20–32)
Calcium: 8.9 mg/dL (ref 8.6–10.4)
Chloride: 104 mmol/L (ref 98–110)
Creat: 0.76 mg/dL (ref 0.50–0.99)
Globulin: 2.4 g/dL (calc) (ref 1.9–3.7)
Glucose, Bld: 96 mg/dL (ref 65–99)
Potassium: 4.9 mmol/L (ref 3.5–5.3)
Sodium: 142 mmol/L (ref 135–146)
Total Bilirubin: 0.2 mg/dL (ref 0.2–1.2)
Total Protein: 6.7 g/dL (ref 6.1–8.1)

## 2020-11-17 LAB — LIPID PANEL
Cholesterol: 152 mg/dL (ref <200)
HDL: 43 mg/dL — ABNORMAL LOW (ref >=50)
LDL Cholesterol (Calc): 80 mg/dL (calc)
Non-HDL Cholesterol (Calc): 109 mg/dL (calc) (ref <130)
Total CHOL/HDL Ratio: 3.5 (calc) (ref <5.0)
Triglycerides: 196 mg/dL — ABNORMAL HIGH (ref <150)

## 2020-11-17 LAB — CBC WITH DIFFERENTIAL/PLATELET
Absolute Monocytes: 705 cells/uL (ref 200–950)
Basophils Absolute: 52 cells/uL (ref 0–200)
Basophils Relative: 0.6 %
Eosinophils Absolute: 191 cells/uL (ref 15–500)
Eosinophils Relative: 2.2 %
HCT: 36.8 % (ref 35.0–45.0)
Hemoglobin: 12.2 g/dL (ref 11.7–15.5)
Lymphs Abs: 2532 cells/uL (ref 850–3900)
MCH: 29.8 pg (ref 27.0–33.0)
MCHC: 33.2 g/dL (ref 32.0–36.0)
MCV: 89.8 fL (ref 80.0–100.0)
MPV: 11.5 fL (ref 7.5–12.5)
Monocytes Relative: 8.1 %
Neutro Abs: 5220 cells/uL (ref 1500–7800)
Neutrophils Relative %: 60 %
Platelets: 250 10*3/uL (ref 140–400)
RBC: 4.1 10*6/uL (ref 3.80–5.10)
RDW: 13.1 % (ref 11.0–15.0)
Total Lymphocyte: 29.1 %
WBC: 8.7 10*3/uL (ref 3.8–10.8)

## 2020-11-17 LAB — MICROALBUMIN / CREATININE URINE RATIO
Creatinine, Urine: 189 mg/dL (ref 20–275)
Microalb Creat Ratio: 4 mcg/mg creat (ref <30)
Microalb, Ur: 0.8 mg/dL

## 2020-11-17 LAB — TSH: TSH: 1.42 mIU/L (ref 0.40–4.50)

## 2020-11-19 ENCOUNTER — Other Ambulatory Visit (HOSPITAL_BASED_OUTPATIENT_CLINIC_OR_DEPARTMENT_OTHER): Payer: Self-pay | Admitting: Family Medicine

## 2020-11-19 DIAGNOSIS — Z1231 Encounter for screening mammogram for malignant neoplasm of breast: Secondary | ICD-10-CM

## 2020-11-26 DIAGNOSIS — D235 Other benign neoplasm of skin of trunk: Secondary | ICD-10-CM | POA: Diagnosis not present

## 2020-11-26 DIAGNOSIS — L578 Other skin changes due to chronic exposure to nonionizing radiation: Secondary | ICD-10-CM | POA: Diagnosis not present

## 2020-11-26 DIAGNOSIS — L905 Scar conditions and fibrosis of skin: Secondary | ICD-10-CM | POA: Diagnosis not present

## 2020-11-26 DIAGNOSIS — L821 Other seborrheic keratosis: Secondary | ICD-10-CM | POA: Diagnosis not present

## 2020-11-26 DIAGNOSIS — Z85828 Personal history of other malignant neoplasm of skin: Secondary | ICD-10-CM | POA: Diagnosis not present

## 2020-11-27 ENCOUNTER — Encounter (HOSPITAL_BASED_OUTPATIENT_CLINIC_OR_DEPARTMENT_OTHER): Payer: Self-pay

## 2020-11-27 ENCOUNTER — Ambulatory Visit (HOSPITAL_BASED_OUTPATIENT_CLINIC_OR_DEPARTMENT_OTHER)
Admission: RE | Admit: 2020-11-27 | Discharge: 2020-11-27 | Disposition: A | Discharge status: Home / Self Care | Payer: Federal, State, Local not specified - PPO | Source: Ambulatory Visit | Attending: Family Medicine | Admitting: Family Medicine

## 2020-11-27 ENCOUNTER — Other Ambulatory Visit: Payer: Self-pay

## 2020-11-27 DIAGNOSIS — Z780 Asymptomatic menopausal state: Secondary | ICD-10-CM | POA: Diagnosis not present

## 2020-11-27 DIAGNOSIS — E2839 Other primary ovarian failure: Secondary | ICD-10-CM

## 2020-11-27 DIAGNOSIS — Z1231 Encounter for screening mammogram for malignant neoplasm of breast: Secondary | ICD-10-CM | POA: Insufficient documentation

## 2020-12-05 ENCOUNTER — Ambulatory Visit: Payer: Federal, State, Local not specified - PPO | Admitting: Family Medicine

## 2021-02-01 ENCOUNTER — Other Ambulatory Visit: Payer: Self-pay | Admitting: Family Medicine

## 2021-02-01 DIAGNOSIS — E785 Hyperlipidemia, unspecified: Secondary | ICD-10-CM

## 2021-02-06 DIAGNOSIS — S39012A Strain of muscle, fascia and tendon of lower back, initial encounter: Secondary | ICD-10-CM | POA: Diagnosis not present

## 2021-02-06 DIAGNOSIS — X500XXA Overexertion from strenuous movement or load, initial encounter: Secondary | ICD-10-CM | POA: Diagnosis not present

## 2021-02-06 DIAGNOSIS — M545 Low back pain, unspecified: Secondary | ICD-10-CM | POA: Diagnosis not present

## 2021-02-06 DIAGNOSIS — Y999 Unspecified external cause status: Secondary | ICD-10-CM | POA: Diagnosis not present

## 2021-02-12 ENCOUNTER — Encounter (INDEPENDENT_AMBULATORY_CARE_PROVIDER_SITE_OTHER): Payer: Self-pay | Admitting: Family Medicine

## 2021-02-12 ENCOUNTER — Other Ambulatory Visit: Payer: Self-pay

## 2021-02-12 ENCOUNTER — Ambulatory Visit (INDEPENDENT_AMBULATORY_CARE_PROVIDER_SITE_OTHER): Payer: Federal, State, Local not specified - PPO | Admitting: Family Medicine

## 2021-02-12 VITALS — BP 130/81 | HR 81 | Temp 98.3°F | Ht 61.0 in | Wt 253.0 lb

## 2021-02-12 DIAGNOSIS — J45909 Unspecified asthma, uncomplicated: Secondary | ICD-10-CM

## 2021-02-12 DIAGNOSIS — M25562 Pain in left knee: Secondary | ICD-10-CM

## 2021-02-12 DIAGNOSIS — M25561 Pain in right knee: Secondary | ICD-10-CM

## 2021-02-12 DIAGNOSIS — Z6841 Body Mass Index (BMI) 40.0 and over, adult: Secondary | ICD-10-CM

## 2021-02-12 DIAGNOSIS — E7849 Other hyperlipidemia: Secondary | ICD-10-CM

## 2021-02-12 DIAGNOSIS — Z1331 Encounter for screening for depression: Secondary | ICD-10-CM | POA: Diagnosis not present

## 2021-02-12 DIAGNOSIS — F39 Unspecified mood [affective] disorder: Secondary | ICD-10-CM

## 2021-02-12 DIAGNOSIS — Z9189 Other specified personal risk factors, not elsewhere classified: Secondary | ICD-10-CM

## 2021-02-12 DIAGNOSIS — R0602 Shortness of breath: Secondary | ICD-10-CM

## 2021-02-12 DIAGNOSIS — R5383 Other fatigue: Secondary | ICD-10-CM | POA: Diagnosis not present

## 2021-02-12 DIAGNOSIS — Z0289 Encounter for other administrative examinations: Secondary | ICD-10-CM

## 2021-02-13 LAB — INSULIN, RANDOM: INSULIN: 11.8 u[IU]/mL (ref 2.6–24.9)

## 2021-02-13 LAB — HEMOGLOBIN A1C
Est. average glucose Bld gHb Est-mCnc: 123 mg/dL
Hgb A1c MFr Bld: 5.9 % — ABNORMAL HIGH (ref 4.8–5.6)

## 2021-02-13 LAB — FOLATE: Folate: 5.9 ng/mL (ref >3.0)

## 2021-02-13 LAB — VITAMIN B12: Vitamin B-12: 324 pg/mL (ref 232–1245)

## 2021-02-13 NOTE — Progress Notes (Signed)
Dear Dr. Carollee Herter,   Thank you for referring Tina Hinton to our clinic. The following note includes my evaluation and treatment recommendations.  Chief Complaint:   OBESITY Haadiya Narain (MR# [id number]) is a 65 y.o. female who presents for evaluation and treatment of obesity and related comorbidities. Current BMI is Body mass index is 47.8 kg/m. Zoua has been struggling with her weight for many years and has been unsuccessful in either losing weight, maintaining weight loss, or reaching her healthy weight goal.  Francelia is currently in the action stage of change and ready to dedicate time achieving and maintaining a healthier weight. Genesis is interested in becoming our patient and working on intensive lifestyle modifications including (but not limited to) diet and exercise for weight loss.  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 Her 74 year old son, Gaspar Bidding, lives with her. 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 She loves bread/carbs.  Nelsie's habits were reviewed today and are as follows: she thinks her family will eat healthier with her, her desired weight loss is 103 lbs, she has been heavy most of her life, she started gaining weight after having her first child, her heaviest weight ever was 256 pounds, she has significant food cravings issues, she snacks frequently in the evenings, she skips meals frequently, she is frequently drinking liquids with calories, she frequently makes poor food choices, she has problems with excessive hunger, she frequently eats larger portions than normal, she has binge eating behaviors, and she struggles with emotional eating.  Depression Screen Yehudis's Food and Mood (modified PHQ-9) score was 17.  Depression screen PHQ 2/9 02/12/2021  Decreased Interest 3  Down, Depressed, Hopeless 3  PHQ - 2 Score 6  Altered sleeping 1  Tired, decreased energy 3   Change in appetite 1  Feeling bad or failure about yourself  3  Trouble concentrating 0  Moving slowly or fidgety/restless 3  Suicidal thoughts 0  PHQ-9 Score 17  Difficult doing work/chores Very difficult   Subjective:   1. Other fatigue Alyss admits to daytime somnolence and admits to waking up still tired. Patent has a history of symptoms of daytime fatigue, morning fatigue, and morning headache. Laprincia generally gets 8 hours of sleep per night, and states that she has generally restful sleep. Snoring is present. Apneic episodes are not present. Epworth Sleepiness Score is 7.  2. Shortness of breath on exertion Amauri notes increasing shortness of breath with exercising and seems to be worsening over time with weight gain. She notes getting out of breath sooner with activity than she used to. This has gotten worse recently. Teresina denies shortness of breath at rest or orthopnea.  3. Other hyperlipidemia Course: Not at goal. Lipid-lowering medications: Crestor. She has elevated triglycerides and is not taking fenofibrate.   Lab Results  Component Value Date   CHOL 152 11/16/2020   HDL 43 (L) 11/16/2020   LDLCALC 80 11/16/2020   LDLDIRECT 155.0 06/25/2015   TRIG 196 (H) 11/16/2020   CHOLHDL 3.5 11/16/2020   Lab Results  Component Value Date   ALT 13 11/16/2020   AST 18 11/16/2020   ALKPHOS 77 08/02/2019   BILITOT 0.2 11/16/2020   The 10-year ASCVD risk score Mikey Bussing DC Jr., et al., 2013) is: 10.4%   Values used to calculate the score:     Age: 89 years     Sex: Female  Is Non-Hispanic African American: No     Diabetic: Yes     Tobacco smoker: No     Systolic Blood Pressure: [id number]     Is BP treated: No     HDL Cholesterol: 43 mg/dL     Total Cholesterol: 152 mg/dL  4. Pain in both knees, unspecified chronicity Pt is limiting her activity and uses a cane QD.  5. Mood disorder ([place]) with emotional eating Medication: Paxil. Pt was initially started on Paxil  for hot flashes and is treated by her GYN.  6. Asthma, unspecified asthma severity, unspecified whether complicated, unspecified whether persistent No acute concerns. Medication: Qvar QD and Albuterol PRN.  7. At risk for impaired metabolic function Nilaya is at increased risk for impaired metabolic function due to obesity.  Assessment/Plan:   Orders Placed This Encounter  Procedures   Vitamin B12   Folate   Hemoglobin A1c   Insulin, random   EKG 12-Lead    Medications Discontinued During This Encounter  Medication Reason   lansoprazole (PREVACID) 15 MG capsule Error     No orders of the defined types were placed in this encounter.    1. Other fatigue Jazzmyn does feel that her weight is causing her energy to be lower than it should be. Fatigue may be related to obesity, depression or many other causes. Labs will be ordered, and in the meanwhile, Fifi will focus on self care including making healthy food choices, increasing physical activity and focusing on stress reduction. Check labs today.  - Vitamin B12 - EKG 12-Lead - Folate - Hemoglobin A1c - Insulin, random  2. Shortness of breath on exertion Tamiki does feel that she gets out of breath more easily that she used to when she exercises. Lorelie's shortness of breath appears to be obesity related and exercise induced. She has agreed to work on weight loss and gradually increase exercise to treat her exercise induced shortness of breath. Will continue to monitor closely.  3. Other hyperlipidemia Last checked 2-3 months ago with PCP. LDL was good and HDL was normal. Follow prudent nutritional plan.  Dietary changes: Increase soluble fiber, decrease simple carbohydrates, decrease saturated fat. Exercise changes: Moderate to vigorous-intensity aerobic activity 150 minutes per week or as tolerated. We will continue to monitor along with PCP/specialists as it pertains to her weight loss journey.  4. Pain in both knees,  unspecified chronicity Focus on weight loss via prudent nutritional plan.  5. Mood disorder ([place]) with emotional eating Refer to Dr. Mallie Mussel. Continue meds, prudent nutritional plan, and eventually increase exercise. We will closely monitor.  6. Asthma, unspecified asthma severity, unspecified whether complicated, unspecified whether persistent Stable symptoms. Controlled.  7. Screening for depression Lile had a positive depression screening. Depression is commonly associated with obesity and often results in emotional eating behaviors. We will monitor this closely and work on CBT to help improve the non-hunger eating patterns. Referral to Psychology may be required if no improvement is seen as she continues in our clinic.  8. At risk for impaired metabolic function Due to Kory's current state of health and medical condition(s), she is at a significantly higher risk for impaired metabolic function.   At least 9 minutes was spent on counseling Destinae about these concerns today.  This places the patient at a much greater risk to subsequently develop cardio-pulmonary conditions that can negatively affect the patient's quality of life.  I stressed the importance of reversing these risks factors.  The initial goal  is to lose at least 5-10% of starting weight to help reduce risk factors.  Counseling:  Intensive lifestyle modifications discussed with Tamiya as the most appropriate first line treatment.  she will continue to work on diet, exercise, and weight loss efforts.  We will continue to reassess these conditions on a fairly regular basis in an attempt to decrease the patient's overall morbidity and mortality.  9. Obesity with current BMI of 48.0  Jessey is currently in the action stage of change and her goal is to continue with weight loss efforts. I recommend Demya begin the structured treatment plan as follows:  She has agreed to the Category 1 Plan.  Exercise goals:  As is     Behavioral modification strategies: meal planning and cooking strategies and planning for success.  She was informed of the importance of frequent follow-up visits to maximize her success with intensive lifestyle modifications for her multiple health conditions. She was informed we would discuss her lab results at her next visit unless there is a critical issue that needs to be addressed sooner. Flonnie agreed to keep her next visit at the agreed upon time to discuss these results.  Objective:   Blood pressure 130/81, pulse 81, temperature 98.3 F (36.8 C), height '5\' 1"'$  (1.549 m), weight 253 lb (114.8 kg), SpO2 97 %. Body mass index is 47.8 kg/m.  EKG: Abnormal- consider old anterior infarct. Sinus rhythm, rate 79.  Indirect Calorimeter completed today shows a VO2 of 185 and a REE of 1267.  Her calculated basal metabolic rate is [id number] thus her basal metabolic rate is worse than expected.  General: Cooperative, alert, well developed, in no acute distress. HEENT: Conjunctivae and lids unremarkable. Cardiovascular: Regular rhythm.  Lungs: Normal work of breathing. Neurologic: No focal deficits.   Lab Results  Component Value Date   CREATININE 0.76 11/16/2020   BUN 18 11/16/2020   NA 142 11/16/2020   K 4.9 11/16/2020   CL 104 11/16/2020   CO2 28 11/16/2020   Lab Results  Component Value Date   ALT 13 11/16/2020   AST 18 11/16/2020   ALKPHOS 77 08/02/2019   BILITOT 0.2 11/16/2020   Lab Results  Component Value Date   HGBA1C 5.9 (H) 02/12/2021   Lab Results  Component Value Date   INSULIN WILL FOLLOW 02/12/2021   Lab Results  Component Value Date   TSH 1.42 11/16/2020   Lab Results  Component Value Date   CHOL 152 11/16/2020   HDL 43 (L) 11/16/2020   LDLCALC 80 11/16/2020   LDLDIRECT 155.0 06/25/2015   TRIG 196 (H) 11/16/2020   CHOLHDL 3.5 11/16/2020   Lab Results  Component Value Date   WBC 8.7 11/16/2020   HGB 12.2 11/16/2020   HCT 36.8 11/16/2020   MCV  89.8 11/16/2020   PLT 250 11/16/2020    Attestation Statements:   Reviewed by clinician on day of visit: allergies, medications, problem list, medical history, surgical history, family history, social history, and previous encounter notes.  Coral Ceo, CMA, am acting as transcriptionist for [employer], DO.  I have reviewed the above documentation for accuracy and completeness, and I agree with the above. Marjory Sneddon, D.O.  The [place] was signed into law in 2016 which includes the topic of electronic health records.  This provides immediate access to information in MyChart.  This includes consultation notes, operative notes, office notes, lab results and pathology reports.  If you have any questions  about what you read please let us know at your next visit so we can discuss your concerns and take corrective action if need be.  We are right here with you.

## 2021-02-14 ENCOUNTER — Other Ambulatory Visit: Payer: Self-pay

## 2021-02-14 ENCOUNTER — Ambulatory Visit: Payer: Federal, State, Local not specified - PPO | Admitting: Family Medicine

## 2021-02-14 VITALS — Ht 61.0 in | Wt 253.0 lb

## 2021-02-14 DIAGNOSIS — S39012A Strain of muscle, fascia and tendon of lower back, initial encounter: Secondary | ICD-10-CM

## 2021-02-14 DIAGNOSIS — S39012D Strain of muscle, fascia and tendon of lower back, subsequent encounter: Secondary | ICD-10-CM | POA: Insufficient documentation

## 2021-02-14 MED ORDER — KETOROLAC TROMETHAMINE 30 MG/ML IJ SOLN
30.0000 mg | Freq: Once | INTRAMUSCULAR | Status: AC
  Administered 2021-02-14: 30 mg via INTRAMUSCULAR

## 2021-02-14 MED ORDER — HYDROCODONE-ACETAMINOPHEN 5-325 MG PO TABS: 1.0000 | ORAL_TABLET | Freq: Three times a day (TID) | ORAL | 0 refills | Status: DC | PRN

## 2021-02-14 MED ORDER — METHYLPREDNISOLONE ACETATE 40 MG/ML IJ SUSP
40.0000 mg | Freq: Once | INTRAMUSCULAR | Status: AC
  Administered 2021-02-14: 40 mg via INTRAMUSCULAR

## 2021-02-14 NOTE — Assessment & Plan Note (Signed)
Symptoms most consistent with strain.  She does have a history of spondylolisthesis at L5-S1.  No radicular component today. -Counseled on home exercise therapy and supportive care. -IM Toradol and Depo-Medrol. -Norco. -Could consider physical therapy or SI joint injection.

## 2021-02-14 NOTE — Patient Instructions (Signed)
Good to see you Please try heat  Please try the stretches  Please use the norco for severe pain   Please send me a message in MyChart with any questions or updates.  Please see me back in 1-2 weeks.   --Dr. Raeford Razor

## 2021-02-14 NOTE — Progress Notes (Signed)
Tina Hinton - 65 y.o. female MRN [id number]  Date of birth: [birth year]  SUBJECTIVE:  Including CC & ROS.  No chief complaint on file.   Tina Hinton is a 65 y.o. female that is presenting with acute low back pain.  She has a history of similar type pain from years ago.  She was lifting an object and felt severe strain on the lower portion.  Having pain with any ambulation and weightbearing.  No radicular component.  Review of the x-ray lumbar spine from 8/21 shows no acute changes.   Review of Systems See HPI   HISTORY: Past Medical, Surgical, Social, and Family History Reviewed & Updated per EMR.   Pertinent Historical Findings include:  Past Medical History:  Diagnosis Date   Ankle fracture    Right   Anxiety    Arthritis    hands, knees   Asthma    Bilateral knee pain    Chest pain    Chicken pox    Clavicle fracture    Left   Depression    Foot fracture, left    GERD (gastroesophageal reflux disease)    Hyperlipidemia    IBS (irritable bowel syndrome) 01/19/2017   Joint pain    Low back pain    Osteoarthritis    Other fatigue    Palpitations    Panic attacks    Seasonal allergies    Shortness of breath on exertion    Swallowing difficulty    Uterine polyp    ablation    Past Surgical History:  Procedure Laterality Date   BIOSPY     HEAD   CESAREAN SECTION     COLONOSCOPY     ENDOMETRIAL ABLATION     MANDIBLE SURGERY     TONSILLECTOMY     TUBAL LIGATION     UPPER GASTROINTESTINAL ENDOSCOPY      Family History  Problem Relation Age of Onset   Obesity Mother    Cancer Mother    Arthritis Mother    Breast cancer Mother 15   Arthritis Maternal Grandmother    Heart disease Maternal Grandmother    Diabetes Maternal Grandmother    Breast cancer Paternal Grandmother    Prostate cancer Paternal Grandfather    Cancer Maternal Aunt    Breast cancer Maternal Aunt    Obesity Other    Colon cancer Neg Hx    Esophageal cancer Neg Hx     Rectal cancer Neg Hx    Stomach cancer Neg Hx     Social History   Socioeconomic History   Marital status: Divorced    Spouse name: Not on file   Number of children: Not on file   Years of education: Not on file   Highest education level: Not on file  Occupational History   Occupation: Surveyor, quantity: [employer]  Tobacco Use   Smoking status: Never   Smokeless tobacco: Never  Vaping Use   Vaping Use: Never used  Substance and Sexual Activity   Alcohol use: Yes    Alcohol/week: 0.0 standard drinks    Comment: occ   Drug use: No   Sexual activity: Not Currently  Other Topics Concern   Not on file  Social History Narrative   No regular exercise.   Social Determinants of Health   Financial Resource Strain: Not on file  Food Insecurity: Not on file  Transportation Needs: Not on file  Physical Activity: Not on file  Stress: Not  on file  Social Connections: Not on file  Intimate Partner Violence: Not on file     PHYSICAL EXAM:  VS: Ht '5\' 1"'$  (1.549 m)   Wt 253 lb (114.8 kg)   BMI 47.80 kg/m  Physical Exam Gen: NAD, alert, cooperative with exam, well-appearing     ASSESSMENT & PLAN:   Lumbar strain, initial encounter Symptoms most consistent with strain.  She does have a history of spondylolisthesis at L5-S1.  No radicular component today. -Counseled on home exercise therapy and supportive care. -IM Toradol and Depo-Medrol. -Norco. -Could consider physical therapy or SI joint injection.

## 2021-02-18 NOTE — Progress Notes (Signed)
Office: [phone]  /  Fax: [phone]    Date: March 04, 2021   Appointment Start Time: 3:07pm Duration: 45 minutes Provider: Glennie Isle, Psy.D. Type of Session: Intake for Individual Therapy  Location of Patient: Home (private location) Location of Provider: Provider's home (private office) Type of Contact: Telepsychological Visit via MyChart Video Visit  Informed Consent: Prior to proceeding with today's appointment, two pieces of identifying information were obtained. In addition, Tina Hinton's physical location at the time of this appointment was obtained as well a phone number she could be reached at in the event of technical difficulties. [name] and this provider participated in today's telepsychological service.   The provider's role was explained to [employer] The provider reviewed and discussed issues of confidentiality, privacy, and limits therein (e.g., reporting obligations). In addition to verbal informed consent, written informed consent for psychological services was obtained prior to the initial appointment. Since the clinic is not a 24/7 crisis center, mental health emergency resources were shared and this  provider explained MyChart, e-mail, voicemail, and/or other messaging systems should be utilized only for non-emergency reasons. This provider also explained that information obtained during appointments will be placed in Zenola's medical record and relevant information will be shared with other providers at [facility] for coordination of care. Sarem agreed information may be shared with other [facility] providers as needed for coordination of care and by signing the service agreement document, she provided written consent for coordination of care. Prior to initiating telepsychological services, Tina Hinton completed an informed consent document, which included the development of a safety plan (i.e., an emergency contact and  emergency resources) in the event of an emergency/crisis. Tina Hinton verbally acknowledged understanding she is ultimately responsible for understanding her insurance benefits for telepsychological and in-person services. This provider also reviewed confidentiality, as it relates to telepsychological services, as well as the rationale for telepsychological services (i.e., to reduce exposure risk to COVID-19). Tina Hinton  acknowledged understanding that appointments cannot be recorded without both party consent and she is aware she is responsible for securing confidentiality on her end of the session. Tina Hinton verbally consented to proceed.  Chief Complaint/HPI: Tina Hinton was referred by Dr. Mellody Dance due to  mood disorder with emotional eating . Per the note for the initial visit with Dr. Mellody Dance on February 12, 2021, "Medication: Paxil. Pt was initially started on Paxil for hot flashes and is treated by her GYN." The note for the initial appointment with Dr. Mellody Dance further indicated the following: "she thinks her family will eat healthier with her, her desired weight loss is 103 lbs, she has been heavy most of her life, she started gaining weight after having her first child, her heaviest weight ever was 256 pounds, she has significant food cravings issues, she snacks frequently in the evenings, she skips meals frequently, she is frequently drinking liquids with calories, she frequently makes poor food choices, she has problems with excessive hunger, she frequently eats larger portions than normal, she has binge eating behaviors, and she struggles with emotional eating." Tina Hinton's Food and Mood (modified PHQ-9) score on February 12, 2021 was 17.  During today's appointment, [name] reported, "I'm 66 and I have had weight loss up and down all my life." She feels her "biggest problem" is she "buy[s] too much food," cooks it, and eats it. [name] acknowledged she shows love through food. She was  verbally administered a questionnaire assessing various behaviors related to emotional eating behaviors. Tina Hinton endorsed the  following: overeat when you are celebrating, experience food cravings on a regular basis, eat certain foods when you are anxious, stressed, depressed, or your feelings are hurt, use food to help you cope with emotional situations, find food is comforting to you, overeat when you are worried about something, overeat frequently when you are bored or lonely, not worry about what you eat when you are in a good mood, and eat as a reward. She shared she craves cheese, Coke, and sweets (e.g., pastries). Tina Hinton believes the onset of emotional eating behaviors was likely in childhood. Prior to starting the structured meal plan, she described frequency of emotional eating behaviors as "daily." In addition, Tina Hinton denied a history of binge eating behaviors. Tina Hinton denied a history of restricting food intake, purging and engagement in other compensatory strategies for weight loss, and has never been diagnosed with an eating disorder. She also denied a history of treatment for emotional eating. Furthermore, Tina Hinton discussed feeling "stressed" and "depressed" about being "pressured to return back to headquarters in [place]" for work, noting she would need different accommodations for health reasons. She indicated recent weight loss has helped with mood.   Mental Status Examination:  Appearance: well groomed and appropriate hygiene  Behavior: appropriate to circumstances Mood: sad Affect: mood congruent Speech: normal in rate, volume, and tone Eye Contact: appropriate Psychomotor Activity: appropriate  Gait: unable to assess  Thought Process: linear, logical, and goal directed  Thought Content/Perception: denies suicidal and homicidal ideation, plan, and intent, no hallucinations, delusions, bizarre thinking or behavior reported or observed, and denies ideation and engagement in  self-injurious behaviors Orientation: time, person, place, and purpose of appointment Memory/Concentration: memory, attention, language, and fund of knowledge intact  Insight/Judgment: good  Family & Psychosocial History: Lillyannah reported she is divorced and she has three adult sons. 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 Additionally, Cherese shared her highest level of education obtained is a Human resources officer in nursing." Currently, Bijou's social support system consists of her two sisters and sons. Moreover, Antionette stated her middle son with resides her.   Medical History:  Past Medical History:  Diagnosis Date   Ankle fracture    Right   Anxiety    Arthritis    hands, knees   Asthma    Bilateral knee pain    Chest pain    Chicken pox    Clavicle fracture    Left   Depression    Foot fracture, left    GERD (gastroesophageal reflux disease)    Hyperlipidemia    IBS (irritable bowel syndrome) 01/19/2017   Joint pain    Low back pain    Osteoarthritis    Other fatigue    Palpitations    Panic attacks    Seasonal allergies    Shortness of breath on exertion    Swallowing difficulty    Uterine polyp    ablation   Past Surgical History:  Procedure Laterality Date   BIOSPY     HEAD   CESAREAN SECTION     COLONOSCOPY     ENDOMETRIAL ABLATION     MANDIBLE SURGERY     TONSILLECTOMY     TUBAL LIGATION     UPPER GASTROINTESTINAL ENDOSCOPY     Current Outpatient Medications on File Prior to Visit  Medication Sig Dispense Refill   albuterol (VENTOLIN HFA) 108 (90 Base) MCG/ACT inhaler TAKE 2 PUFFS BY MOUTH 4 TIMES A DAY AS NEEDED 6.7 g 2  aspirin 81 MG chewable tablet Chew 81 mg by mouth daily.     beclomethasone (QVAR REDIHALER) 40 MCG/ACT inhaler Inhale 2 puffs into the lungs 2 (two) times daily. 1 each 2   cyclobenzaprine (FLEXERIL) 10 MG tablet Take 10 mg by mouth 2 (two) times daily as needed for muscle spasms.      diclofenac (VOLTAREN) 75 MG EC tablet TAKE 1 TABLET BY MOUTH TWICE A DAY 180 tablet 1   diclofenac sodium (VOLTAREN) 1 % GEL Apply 2 g topically 4 (four) times daily as needed.     dicyclomine (BENTYL) 20 MG tablet 40 mg qid prn 60 tablet 1   fenofibrate 160 MG tablet TAKE 1 TABLET (160 MG TOTAL) BY MOUTH DAILY. 90 tablet 1   HYDROcodone-acetaminophen (NORCO/VICODIN) 5-325 MG tablet Take 1 tablet by mouth every 8 (eight) hours as needed. 15 tablet 0   lansoprazole (PREVACID) 30 MG capsule Take 30 mg by mouth in the morning and at bedtime.     Loratadine (CLARITIN PO) Take 10 mg by mouth daily. Reported on 10/11/2015     meclizine (ANTIVERT) 25 MG tablet Take by mouth.     metFORMIN (GLUCOPHAGE) 500 MG tablet 1 po qd with lunch 30 tablet 0   ondansetron (ZOFRAN) 4 MG tablet Take 1 tablet (4 mg total) by mouth every 8 (eight) hours as needed for nausea or vomiting. 20 tablet 0   PARoxetine (PAXIL) 10 MG tablet Take 1 tablet (10 mg total) by mouth daily. 90 tablet 4   promethazine (PHENERGAN) 12.5 MG tablet      rosuvastatin (CRESTOR) 20 MG tablet TAKE 1 TABLET BY MOUTH EVERYDAY AT BEDTIME 90 tablet 1   triamcinolone cream (KENALOG) 0.1 % APPLY TO AFFECTED AREA TWICE A DAY 30 g 0   No current facility-administered medications on file prior to visit.  Medication compliant; no concerns.   Mental Health History: Krisi reported she has never attended therapeutic services. She shared she is currently prescribed Paxil by her GYN for hot flashes, adding it also helps with her mood. She indicated a plan to speak with her prescribing provider to increase the dose. She reported a history of panic attacks, noting she was prescribed Xanax PRN in the past. Araiyah reported there is no history of hospitalizations for psychiatric concerns. Marle denied a family history of mental health/substance abuse related concerns. Perris reported there is no history of trauma including psychological, physical , and sexual  abuse, as well as neglect.   Anastasha described her typical mood lately as "depressed" and "anxious." She indicated starting with the clinic has helped her mood improve some. Shantasia reported experiencing racing thoughts at night which impacts her ability to go to sleep, noting the aforementioned started secondary to work related concerns one month ago. Kiyah indicated she began experiencing panic attack secondary to her nephew dying by suicide in 2013, noting they ceased. However, ongoing work stress has triggered panic attacks (chest pain, shortness of breath, racing thoughts) recently. She reported she last experienced a panic attack a couple days ago, adding the frequency is "every couple days." She stated she copes by walking away, sitting down, and concentrating on breathing. Nyaisa was receptive to reaching out to her PCP. She also discussed "uncontrollably crying." She clarified the crying spells, decreased mood, sleep issues, anxiety and panic attacks started approximately one month ago secondary to the work stress. She added, "Prior to all of this, I was completely content."    Lakerria reported she is "sensitive"  to alcohol; therefore, she may consume alcohol on "occasion."  She denied tobacco use. She denied illicit/recreational substance use. Regarding caffeine intake, Margarethe reported she consumes to 12-24oz of coffee daily. Furthermore, Lakisha indicated she is not experiencing the following: hallucinations and delusions, paranoia, symptoms of mania , social withdrawal, memory concerns, and obsessions and compulsions. She also denied history of and current suicidal ideation, plan, and intent; history of and current homicidal ideation, plan, and intent; and history of and current engagement in self-harm.  The following strengths were reported by Tina Hinton: very organized, good at management and development, relatable, helpful, and caring. The following strengths were observed by this provider:  ability to express thoughts and feelings during the therapeutic session, ability to establish and benefit from a therapeutic relationship, willingness to work toward established goal(s) with the clinic and ability to engage in reciprocal conversation.   Legal History: Clemetine reported there is no history of legal involvement.   Structured Assessments Results: The Patient Health Questionnaire-9 (PHQ-9) is a self-report measure that assesses symptoms and severity of depression over the course of the last two weeks. Charle obtained a score of 4 suggesting minimal depression. Synetta finds the endorsed symptoms to be somewhat difficult. [0= Not at all; 1= Several days; 2= More than half the days; 3= Nearly every day] Little interest or pleasure in doing things 0  Feeling down, depressed, or hopeless 1  Trouble falling or staying asleep, or sleeping too much 3  Feeling tired or having little energy 0  Poor appetite or overeating 0  Feeling bad about yourself --- or that you are a failure or have let yourself or your family down 0  Trouble concentrating on things, such as reading the newspaper or watching television 0  Moving or speaking so slowly that other people could have noticed? Or the opposite --- being so fidgety or restless that you have been moving around a lot more than usual 0  Thoughts that you would be better off dead or hurting yourself in some way 0  PHQ-9 Score 4    The Generalized Anxiety Disorder-7 (GAD-7) is a brief self-report measure that assesses symptoms of anxiety over the course of the last two weeks. Quilla obtained a score of 10 suggesting moderate anxiety. Laylanni finds the endorsed symptoms to be somewhat difficult. [0= Not at all; 1= Several days; 2= Over half the days; 3= Nearly every day] Feeling nervous, anxious, on edge 1  Not being able to stop or control worrying 3  Worrying too much about different things 3  Trouble relaxing 1  Being so restless that it's hard  to sit still 1  Becoming easily annoyed or irritable 1  Feeling afraid as if something awful might happen 0  GAD-7 Score 10   Interventions:  Conducted a chart review Focused on rapport building Verbally administered PHQ-9 and GAD-7 for symptom monitoring Verbally administered Food & Mood questionnaire to assess various behaviors related to emotional eating Provided emphatic reflections and validation Collaborated with patient on a treatment goal  Psychoeducation provided regarding physical versus emotional hunger  Provisional DSM-5 Diagnosis(es): F50.89 Other Specified Feeding or Eating Disorder, Emotional Eating Behaviors and F43.23 Adjustment Disorder With Mixed Anxiety and Depressed Mood  Plan: Shallon appears able and willing to participate as evidenced by collaboration on a treatment goal, engagement in reciprocal conversation, and asking questions as needed for clarification. The next appointment will be scheduled in three weeks, which will be via MyChart Video Visit. The following treatment goal  was established: increase coping skills. This provider will regularly review the treatment plan and medical chart to keep informed of status changes. Arybella expressed understanding and agreement with the initial treatment plan of care.  Dacia will be sent a handout via e-mail to utilize between now and the next appointment to increase awareness of hunger patterns and subsequent eating. Allessandra provided verbal consent during today's appointment for this provider to send the handout via e-mail.

## 2021-02-21 ENCOUNTER — Other Ambulatory Visit: Payer: Self-pay

## 2021-02-21 ENCOUNTER — Encounter: Payer: Self-pay | Admitting: Family Medicine

## 2021-02-21 ENCOUNTER — Ambulatory Visit: Payer: Federal, State, Local not specified - PPO | Admitting: Family Medicine

## 2021-02-21 VITALS — Ht 61.0 in | Wt 253.0 lb

## 2021-02-21 DIAGNOSIS — S39012D Strain of muscle, fascia and tendon of lower back, subsequent encounter: Secondary | ICD-10-CM | POA: Diagnosis not present

## 2021-02-21 NOTE — Progress Notes (Signed)
Tina Hinton - 65 y.o. female MRN [id number]  Date of birth: [birth year]  SUBJECTIVE:  Including CC & ROS.  No chief complaint on file.   Tina Hinton is a 65 y.o. female that is following up for low back pain.  She has improved and has not taken medications in the past day.   Review of Systems See HPI   HISTORY: Past Medical, Surgical, Social, and Family History Reviewed & Updated per EMR.   Pertinent Historical Findings include:  Past Medical History:  Diagnosis Date   Ankle fracture    Right   Anxiety    Arthritis    hands, knees   Asthma    Bilateral knee pain    Chest pain    Chicken pox    Clavicle fracture    Left   Depression    Foot fracture, left    GERD (gastroesophageal reflux disease)    Hyperlipidemia    IBS (irritable bowel syndrome) 01/19/2017   Joint pain    Low back pain    Osteoarthritis    Other fatigue    Palpitations    Panic attacks    Seasonal allergies    Shortness of breath on exertion    Swallowing difficulty    Uterine polyp    ablation    Past Surgical History:  Procedure Laterality Date   BIOSPY     HEAD   CESAREAN SECTION     COLONOSCOPY     ENDOMETRIAL ABLATION     MANDIBLE SURGERY     TONSILLECTOMY     TUBAL LIGATION     UPPER GASTROINTESTINAL ENDOSCOPY      Family History  Problem Relation Age of Onset   Obesity Mother    Cancer Mother    Arthritis Mother    Breast cancer Mother 69   Arthritis Maternal Grandmother    Heart disease Maternal Grandmother    Diabetes Maternal Grandmother    Breast cancer Paternal Grandmother    Prostate cancer Paternal Grandfather    Cancer Maternal Aunt    Breast cancer Maternal Aunt    Obesity Other    Colon cancer Neg Hx    Esophageal cancer Neg Hx    Rectal cancer Neg Hx    Stomach cancer Neg Hx     Social History   Socioeconomic History   Marital status: Divorced    Spouse name: Not on file   Number of children: Not on file   Years of education: Not on  file   Highest education level: Not on file  Occupational History   Occupation: Surveyor, quantity: [employer]  Tobacco Use   Smoking status: Never   Smokeless tobacco: Never  Vaping Use   Vaping Use: Never used  Substance and Sexual Activity   Alcohol use: Yes    Alcohol/week: 0.0 standard drinks    Comment: occ   Drug use: No   Sexual activity: Not Currently  Other Topics Concern   Not on file  Social History Narrative   No regular exercise.   Social Determinants of Health   Financial Resource Strain: Not on file  Food Insecurity: Not on file  Transportation Needs: Not on file  Physical Activity: Not on file  Stress: Not on file  Social Connections: Not on file  Intimate Partner Violence: Not on file     PHYSICAL EXAM:  VS: Ht '5\' 1"'$  (1.549 m)   Wt 253 lb (114.8 kg)   BMI  47.80 kg/m  Physical Exam Gen: NAD, alert, cooperative with exam, well-appearing      ASSESSMENT & PLAN:   Lumbar strain, subsequent encounter Has gotten improvement with medications thus far. -Counseled on home exercise therapy and supportive care. -Referral to physical therapy. -Provided work note

## 2021-02-21 NOTE — Patient Instructions (Signed)
Good to see you Please try heat  Please try physical therapy   Please send me a message in MyChart with any questions or updates.  Please see me back as needed.   --Dr. Raeford Razor

## 2021-02-21 NOTE — Assessment & Plan Note (Signed)
Has gotten improvement with medications thus far. -Counseled on home exercise therapy and supportive care. -Referral to physical therapy. -Provided work note

## 2021-02-26 ENCOUNTER — Encounter (INDEPENDENT_AMBULATORY_CARE_PROVIDER_SITE_OTHER): Payer: Self-pay | Admitting: Family Medicine

## 2021-02-26 ENCOUNTER — Other Ambulatory Visit: Payer: Self-pay

## 2021-02-26 ENCOUNTER — Ambulatory Visit (INDEPENDENT_AMBULATORY_CARE_PROVIDER_SITE_OTHER): Payer: Federal, State, Local not specified - PPO | Admitting: Family Medicine

## 2021-02-26 VITALS — BP 100/62 | HR 83 | Temp 98.2°F | Ht 61.0 in | Wt 249.0 lb

## 2021-02-26 DIAGNOSIS — Z9189 Other specified personal risk factors, not elsewhere classified: Secondary | ICD-10-CM

## 2021-02-26 DIAGNOSIS — K219 Gastro-esophageal reflux disease without esophagitis: Secondary | ICD-10-CM

## 2021-02-26 DIAGNOSIS — F39 Unspecified mood [affective] disorder: Secondary | ICD-10-CM

## 2021-02-26 DIAGNOSIS — R7303 Prediabetes: Secondary | ICD-10-CM | POA: Diagnosis not present

## 2021-02-26 DIAGNOSIS — Z6841 Body Mass Index (BMI) 40.0 and over, adult: Secondary | ICD-10-CM

## 2021-02-26 DIAGNOSIS — E7849 Other hyperlipidemia: Secondary | ICD-10-CM

## 2021-02-26 MED ORDER — METFORMIN HCL 500 MG PO TABS: ORAL_TABLET | ORAL | 0 refills | Status: DC

## 2021-02-27 NOTE — Progress Notes (Signed)
Chief Complaint:   OBESITY Tina Hinton is here to discuss her progress with her obesity treatment plan along with follow-up of her obesity related diagnoses. Tina Hinton is on the Category 1 Plan and states she is following her eating plan approximately 90% of the time. Tina Hinton states she is not currently exercising.  Today's visit was #: 2 Starting weight: 253 lbs Starting date: 02/12/2021 Today's weight: 249 lbs Today's date: 02/26/2021 Total lbs lost to date: 4 Total lbs lost since last in-office visit: 4  Interim History: Tina Hinton is here today for her first follow-up office visit since starting the program with [place].  All blood work/ lab tests that were recently ordered by myself or an outside provider were reviewed with patient today per their request.   Extended time was spent counseling her on all new disease processes that were discovered or preexisting ones that are affected by BMI.  she understands that many of these abnormalities will need to monitored regularly along with the current treatment plan of prudent dietary changes, in which we are making each and every office visit, to improve these health parameters. Tina Hinton is here for a follow up office visit.  We reviewed her meal plan and questions were answered.  Patient's food recall appears to be accurate and consistent with what is on plan when she is following it.   When eating on plan, her hunger and cravings are well controlled.   Tina Hinton is measuring her proteins and not skipping any foods. She got off regular soda completely and increased her water intake. She reports some carb cravings after dinner.  Subjective:   1. Pre-diabetes New. Discussed labs with patient today. No prior history of pre-diabetes. Tina Hinton has a diagnosis of prediabetes based on her elevated HgA1c and was informed this puts her at greater risk of developing diabetes. She continues to work on diet and exercise to decrease her risk of  diabetes. She denies nausea or hypoglycemia.  Lab Results  Component Value Date   HGBA1C 5.9 (H) 02/12/2021   Lab Results  Component Value Date   INSULIN 11.8 02/12/2021   2. Other hyperlipidemia Discussed labs with patient today. Elevated triglycerides. Medication(s): Crestor and fenofibrate. Patient denies myalgias.   Lab Results  Component Value Date   CHOL 152 11/16/2020   HDL 43 (L) 11/16/2020   LDLCALC 80 11/16/2020   LDLDIRECT 155.0 06/25/2015   TRIG 196 (H) 11/16/2020   CHOLHDL 3.5 11/16/2020   Lab Results  Component Value Date   ALT 13 11/16/2020   AST 18 11/16/2020   ALKPHOS 77 08/02/2019   BILITOT 0.2 11/16/2020   The 10-year ASCVD risk score (Arnett DK, et al., 2019) is: 6.4%   Values used to calculate the score:     Age: 65 years     Sex: Female     Is Non-Hispanic African American: No     Diabetic: Yes     Tobacco smoker: No     Systolic Blood Pressure: 751 mmHg     Is BP treated: No     HDL Cholesterol: 43 mg/dL     Total Cholesterol: 152 mg/dL  3. Gastroesophageal reflux disease, unspecified whether esophagitis present Discussed labs with patient today. Medication: Pepcid and Bentyl.  4. Mood disorder ([place]) with emotional eating Discussed labs with patient today. Stable. Pt was referred to Dr. Mallie Mussel and has an appt Monday.  5. At risk for diabetes mellitus [name] is at higher than average risk for developing  diabetes due to new onset pre-diabetes.   Assessment/Plan:  No orders of the defined types were placed in this encounter.   There are no discontinued medications.   Meds ordered this encounter  Medications   metFORMIN (GLUCOPHAGE) 500 MG tablet    Sig: 1 po qd with lunch    Dispense:  30 tablet    Refill:  0    30 d supply;  ** OV for RF **   Do not send RF request     1. Pre-diabetes Tina Hinton will continue to work on weight loss, exercise, and decreasing simple carbohydrates to help decrease the risk of diabetes. Risks and  benefits of medication discussed with pt. Recheck labs in 3 months along with magnesium and BMP. Handout: Pre-diabetes  Start- metFORMIN (GLUCOPHAGE) 500 MG tablet; 1 po qd with lunch  Dispense: 30 tablet; Refill: 0  2. Other hyperlipidemia Cardiovascular risk and specific lipid/LDL goals reviewed.  We discussed several lifestyle modifications today and Tina Hinton will continue to work on diet, exercise and weight loss efforts. Orders and follow up as documented in patient record. Decrease simple carbohydrates. Continue Crestor.  Counseling Intensive lifestyle modifications are the first line treatment for this issue. Dietary changes: Increase soluble fiber. Decrease simple carbohydrates. Exercise changes: Moderate to vigorous-intensity aerobic activity 150 minutes per week if tolerated. Lipid-lowering medications: see documented in medical record.  3. Gastroesophageal reflux disease, unspecified whether esophagitis present Intensive lifestyle modifications are the first line treatment for this issue. We discussed several lifestyle modifications today and she will continue to work on diet, exercise and weight loss efforts. Orders and follow up as documented in patient record.   Counseling If a person has gastroesophageal reflux disease (GERD), food and stomach acid move back up into the esophagus and cause symptoms or problems such as damage to the esophagus. Anti-reflux measures include: raising the head of the bed, avoiding tight clothing or belts, avoiding eating late at night, not lying down shortly after mealtime, and achieving weight loss. Avoid ASA, NSAID's, caffeine, alcohol, and tobacco.  OTC Pepcid and/or Tums are often very helpful for as needed use.  However, for persisting chronic or daily symptoms, stronger medications like Omeprazole may be needed. You may need to avoid foods and drinks such as: Coffee and tea (with or without caffeine). Drinks that contain alcohol. Energy  drinks and sports drinks. Bubbly (carbonated) drinks or sodas. Chocolate and cocoa. Peppermint and mint flavorings. Garlic and onions. Horseradish. Spicy and acidic foods. These include peppers, chili powder, curry powder, vinegar, hot sauces, and BBQ sauce. Citrus fruit juices and citrus fruits, such as oranges, lemons, and limes. Tomato-based foods. These include red sauce, chili, salsa, and pizza with red sauce. Fried and fatty foods. These include donuts, french fries, potato chips, and high-fat dressings. High-fat meats. These include hot dogs, rib eye steak, sausage, ham, and bacon.  4. Mood disorder ([place]) with emotional eating Behavior modification techniques were discussed today to help Tina Hinton deal with her emotional/non-hunger eating behaviors.  Orders and follow up as documented in patient record. Continue current treatment plan.  5. At risk for diabetes mellitus Tina Hinton was given approximately 23 minutes of diabetes education and counseling today. We discussed intensive lifestyle modifications today with an emphasis on weight loss as well as increasing exercise and decreasing simple carbohydrates in her diet. We also reviewed medication options with an emphasis on risk versus benefit of those discussed.   Repetitive spaced learning was employed today to elicit superior memory formation and  behavioral change.  6. Obesity with current BMI of 47.1  Tina Hinton is currently in the action stage of change. As such, her goal is to continue with weight loss efforts. She has agreed to the Category 1 Plan with breakfast options.   Handout: Bread Substitutions  Exercise goals:  As is  Behavioral modification strategies: increasing lean protein intake, decreasing simple carbohydrates, decreasing liquid calories, emotional eating strategies, and planning for success.  Tina Hinton has agreed to follow-up with our clinic in 2 weeks, with NP Dawn and in 4 weeks with Dr. Raliegh Scarlet. She was informed of  the importance of frequent follow-up visits to maximize her success with intensive lifestyle modifications for her multiple health conditions.   Objective:   Blood pressure 100/62, pulse 83, temperature 98.2 F (36.8 C), height 5\' 1"  (1.549 m), weight 249 lb (112.9 kg), SpO2 98 %. Body mass index is 47.05 kg/m.  General: Cooperative, alert, well developed, in no acute distress. HEENT: Conjunctivae and lids unremarkable. Cardiovascular: Regular rhythm.  Lungs: Normal work of breathing. Neurologic: No focal deficits.   Lab Results  Component Value Date   CREATININE 0.76 11/16/2020   BUN 18 11/16/2020   NA 142 11/16/2020   K 4.9 11/16/2020   CL 104 11/16/2020   CO2 28 11/16/2020   Lab Results  Component Value Date   ALT 13 11/16/2020   AST 18 11/16/2020   ALKPHOS 77 08/02/2019   BILITOT 0.2 11/16/2020   Lab Results  Component Value Date   HGBA1C 5.9 (H) 02/12/2021   Lab Results  Component Value Date   INSULIN 11.8 02/12/2021   Lab Results  Component Value Date   TSH 1.42 11/16/2020   Lab Results  Component Value Date   CHOL 152 11/16/2020   HDL 43 (L) 11/16/2020   LDLCALC 80 11/16/2020   LDLDIRECT 155.0 06/25/2015   TRIG 196 (H) 11/16/2020   CHOLHDL 3.5 11/16/2020   No results found for: VD25OH Lab Results  Component Value Date   WBC 8.7 11/16/2020   HGB 12.2 11/16/2020   HCT 36.8 11/16/2020   MCV 89.8 11/16/2020   PLT 250 11/16/2020     Attestation Statements:   Reviewed by clinician on day of visit: allergies, medications, problem list, medical history, surgical history, family history, social history, and previous encounter notes.  Coral Ceo, CMA, am acting as transcriptionist for [employer], DO.  I have reviewed the above documentation for accuracy and completeness, and I agree with the above. Marjory Sneddon, D.O.  The [place] was signed into law in 2016 which includes the topic of electronic health records.   This provides immediate access to information in MyChart.  This includes consultation notes, operative notes, office notes, lab results and pathology reports.  If you have any questions about what you read please let us know at your next visit so we can discuss your concerns and take corrective action if need be.  We are right here with you.

## 2021-03-04 ENCOUNTER — Telehealth (INDEPENDENT_AMBULATORY_CARE_PROVIDER_SITE_OTHER): Payer: Federal, State, Local not specified - PPO | Admitting: Psychology

## 2021-03-04 DIAGNOSIS — F5089 Other specified eating disorder: Secondary | ICD-10-CM

## 2021-03-04 DIAGNOSIS — F4323 Adjustment disorder with mixed anxiety and depressed mood: Secondary | ICD-10-CM

## 2021-03-11 NOTE — Progress Notes (Signed)
  Office: [phone]  /  Fax: [phone]    Date: March 25, 2021   Appointment Start Time: 8:03am Duration: 29 minutes Provider: Glennie Isle, Psy.D. Type of Session: Individual Therapy  Location of Patient: Home (private location) Location of Provider: Provider's Home (private office) Type of Contact: Telepsychological Visit via MyChart Video Visit  Session Content: Tina Hinton is a 65 y.o. female presenting for a follow-up appointment to address the previously established treatment goal of increasing coping skills.Today's appointment was a telepsychological visit due to COVID-19. Tina Hinton provided verbal consent for today's telepsychological appointment and she is aware she is responsible for securing confidentiality on her end of the session. Prior to proceeding with today's appointment, Tina Hinton's physical location at the time of this appointment was obtained as well a phone number she could be reached at in the event of technical difficulties. Tina Hinton and this provider participated in today's telepsychological service.   This provider conducted a brief check-in. Tina Hinton reported, "Everything has been going really well." She provided an update about work-related stressors as well as weight loss success to date. Positive reinforcement was provided. Of note, Tina Hinton shared about a recent instance where she opted to not attend an event due to concern about temptations. Processed associated thoughts and feelings. Reviewed emotional and physical hunger. [place] discussed a reduction in emotional eating behaviors, noting she started therapy for back pain and discussed her approach to eating congruent to her structured meal plan, which has been helpful. Psychoeducation regarding triggers for emotional eating was provided. Tina Hinton was provided a handout, and encouraged to utilize the handout between now and the next appointment to increase awareness of triggers and frequency. Tina Hinton agreed. This  provider also discussed behavioral strategies for specific triggers, such as placing the utensil down when conversing to avoid mindless eating. Tina Hinton provided verbal consent during today's appointment for this provider to send a handout about triggers via e-mail. Overall, Tina Hinton was receptive to today's appointment as evidenced by openness to sharing, responsiveness to feedback, and willingness to explore triggers for emotional eating.  Mental Status Examination:  Appearance: well groomed and appropriate hygiene  Behavior: appropriate to circumstances Mood: euthymic Affect: mood congruent Speech: normal in rate, volume, and tone Eye Contact: appropriate Psychomotor Activity: appropriate Gait: unable to assess Thought Process: linear, logical, and goal directed  Thought Content/Perception: no hallucinations, delusions, bizarre thinking or behavior reported or observed and no evidence or endorsement of suicidal and homicidal ideation, plan, and intent Orientation: time, person, place, and purpose of appointment Memory/Concentration: memory, attention, language, and fund of knowledge intact  Insight/Judgment: good  Interventions:  Conducted a brief chart review Provided empathic reflections and validation Reviewed content from the previous session Provided positive reinforcement Employed supportive psychotherapy interventions to facilitate reduced distress and to improve coping skills with identified stressors Psychoeducation provided regarding triggers for emotional eating  DSM-5 Diagnosis(es):  F50.89 Other Specified Feeding or Eating Disorder, Emotional Eating Behaviors and F43.23 Adjustment Disorder With Mixed Anxiety and Depressed Mood  Treatment Goal & Progress: During the initial appointment with this provider, the following treatment goal was established: increase coping skills. Tina Hinton has demonstrated some progress in her goal as evidenced by increased awareness of hunger  patterns and reduction in emotional eating-related behaviors per her self-report.   Plan: The next appointment will be scheduled in approximately two weeks, which will be via MyChart Video Visit. The next session will focus on working towards the established treatment goal.

## 2021-03-12 ENCOUNTER — Other Ambulatory Visit: Payer: Self-pay

## 2021-03-12 ENCOUNTER — Ambulatory Visit (INDEPENDENT_AMBULATORY_CARE_PROVIDER_SITE_OTHER): Payer: Federal, State, Local not specified - PPO | Admitting: Family Medicine

## 2021-03-12 ENCOUNTER — Encounter (INDEPENDENT_AMBULATORY_CARE_PROVIDER_SITE_OTHER): Payer: Self-pay | Admitting: Family Medicine

## 2021-03-12 VITALS — BP 123/79 | HR 78 | Temp 98.2°F | Ht 61.0 in | Wt 244.0 lb

## 2021-03-12 DIAGNOSIS — Z6841 Body Mass Index (BMI) 40.0 and over, adult: Secondary | ICD-10-CM

## 2021-03-12 DIAGNOSIS — E66813 Obesity, class 3: Secondary | ICD-10-CM | POA: Insufficient documentation

## 2021-03-12 DIAGNOSIS — R7303 Prediabetes: Secondary | ICD-10-CM | POA: Diagnosis not present

## 2021-03-12 NOTE — Progress Notes (Signed)
Chief Complaint:   OBESITY Tina Hinton is here to discuss her progress with her obesity treatment plan along with follow-up of her obesity related diagnoses. Tina Hinton is on the Category 1 Plan with breakfast options and states she is following her eating plan approximately 95-98% of the time. Tina Hinton states she is doing 0 minutes 0 times per week.  Today's visit was #: 3 Starting weight: 253 lbs Starting date: 02/12/2021 Today's weight: 244 lbs Today's date: 03/12/2021 Total lbs lost to date: 9 lbs Total lbs lost since last in-office visit: 5 lbs  Interim History: Tina Hinton is doing very well on the plan. She has had occasional sweets cravings but she feels Metformin helps with this. She has cut out soda. She used to drink quite a bit. Her hunger is satisfied. Her son who lives with her is very supportive of her weight loss efforts.   Subjective:   1. Prediabetes Tina Hinton's last A1C was 5.9. She is on Metformin which she feels helps with appetite and cravings.   Lab Results  Component Value Date   HGBA1C 5.9 (H) 02/12/2021   Lab Results  Component Value Date   INSULIN 11.8 02/12/2021    Assessment/Plan:   1. Prediabetes Tina Hinton will continue Metformin. She will continue to work on weight loss, exercise, and decreasing simple carbohydrates to help decrease the risk of diabetes.   2. Obesity with current BMI of 46.13 Tina Hinton is currently in the action stage of change. As such, her goal is to continue with weight loss efforts. She has agreed to the Category 1 Plan.   Handouts: Protein exchanges and lunch options.   Exercise goals:  Tina Hinton will start physical therapy next week.   Behavioral modification strategies: meal planning and cooking strategies and better snacking choices.  Tina Hinton has agreed to follow-up with our clinic in 2 weeks with Dr. Raliegh Scarlet and 4 weeks with Tina Hinton, [place].  Objective:   Blood pressure 123/79, pulse 78, temperature 98.2 F (36.8 C), height  5\' 1"  (1.549 m), weight 244 lb (110.7 kg), SpO2 95 %. Body mass index is 46.1 kg/m.  General: Cooperative, alert, well developed, in no acute distress. HEENT: Conjunctivae and lids unremarkable. Cardiovascular: Regular rhythm.  Lungs: Normal work of breathing. Neurologic: No focal deficits.   Lab Results  Component Value Date   CREATININE 0.76 11/16/2020   BUN 18 11/16/2020   NA 142 11/16/2020   K 4.9 11/16/2020   CL 104 11/16/2020   CO2 28 11/16/2020   Lab Results  Component Value Date   ALT 13 11/16/2020   AST 18 11/16/2020   ALKPHOS 77 08/02/2019   BILITOT 0.2 11/16/2020   Lab Results  Component Value Date   HGBA1C 5.9 (H) 02/12/2021   Lab Results  Component Value Date   INSULIN 11.8 02/12/2021   Lab Results  Component Value Date   TSH 1.42 11/16/2020   Lab Results  Component Value Date   CHOL 152 11/16/2020   HDL 43 (L) 11/16/2020   LDLCALC 80 11/16/2020   LDLDIRECT 155.0 06/25/2015   TRIG 196 (H) 11/16/2020   CHOLHDL 3.5 11/16/2020   No results found for: VD25OH Lab Results  Component Value Date   WBC 8.7 11/16/2020   HGB 12.2 11/16/2020   HCT 36.8 11/16/2020   MCV 89.8 11/16/2020   PLT 250 11/16/2020   No results found for: IRON, TIBC, FERRITIN  Attestation Statements:   Reviewed by clinician on day of visit: allergies, medications, problem list, medical  history, surgical history, family history, social history, and previous encounter notes.  I, Tina Hinton, RMA, am acting as Location manager for [employer], [place].   I have reviewed the above documentation for accuracy and completeness, and I agree with the above. -  Georgianne Fick, FNP

## 2021-03-19 ENCOUNTER — Other Ambulatory Visit: Payer: Self-pay

## 2021-03-19 ENCOUNTER — Ambulatory Visit: Payer: Federal, State, Local not specified - PPO | Attending: Family Medicine | Admitting: Physical Therapy

## 2021-03-19 ENCOUNTER — Encounter: Payer: Self-pay | Admitting: Physical Therapy

## 2021-03-19 DIAGNOSIS — R293 Abnormal posture: Secondary | ICD-10-CM | POA: Insufficient documentation

## 2021-03-19 DIAGNOSIS — M6281 Muscle weakness (generalized): Secondary | ICD-10-CM | POA: Insufficient documentation

## 2021-03-19 DIAGNOSIS — M25562 Pain in left knee: Secondary | ICD-10-CM | POA: Diagnosis not present

## 2021-03-19 DIAGNOSIS — M545 Low back pain, unspecified: Secondary | ICD-10-CM | POA: Insufficient documentation

## 2021-03-19 DIAGNOSIS — M25561 Pain in right knee: Secondary | ICD-10-CM | POA: Insufficient documentation

## 2021-03-19 DIAGNOSIS — M62830 Muscle spasm of back: Secondary | ICD-10-CM | POA: Insufficient documentation

## 2021-03-19 NOTE — Patient Instructions (Signed)
    Access Code: [email or web address] URL: [email or web address] Date: 03/19/2021 Prepared by: Annie Paras  Exercises Supine Posterior Pelvic Tilt - 2 x daily - 7 x weekly - 2 sets - 10 reps - 5 sec hold Bent Knee Fallouts with Alternating Legs - 2 x daily - 7 x weekly - 2 sets - 10 reps - 3-5 sec hold Supine Lower Trunk Rotation - 2 x daily - 7 x weekly - 10 reps - 5-10 sec hold Seated Hamstring Stretch with Strap - 2-3 x daily - 7 x weekly - 3 reps - 30 sec hold

## 2021-03-19 NOTE — Therapy (Signed)
[facility] [address]  [place], [place], [address] Phone: [phone]   Fax:  [phone]  Physical Therapy Evaluation  Patient Details  Name: Tina Hinton MRN: [id number] Date of Birth: 1955/12/10 Referring Provider (PT): Clearance Coots, MD   Encounter Date: 03/19/2021   PT End of Session - 03/19/21 1638     Visit Number 1    Number of Visits 16    Date for PT Re-Evaluation 05/14/21    Authorization Type Federal BCBS - [id number] (PT/OT/ST combined - 0 used)    PT Start Time 1638    PT Stop Time 1740    PT Time Calculation (min) 62 min    Activity Tolerance Patient tolerated treatment well    Behavior During Therapy [facility] for tasks assessed/performed             Past Medical History:  Diagnosis Date   Ankle fracture    Right   Anxiety    Arthritis    hands, knees   Asthma    Bilateral knee pain    Chest pain    Chicken pox    Clavicle fracture    Left   Depression    Foot fracture, left    GERD (gastroesophageal reflux disease)    Hyperlipidemia    IBS (irritable bowel syndrome) 01/19/2017   Joint pain    Low back pain    Osteoarthritis    Other fatigue    Palpitations    Panic attacks    Seasonal allergies    Shortness of breath on exertion    Swallowing difficulty    Uterine polyp    ablation    Past Surgical History:  Procedure Laterality Date   BIOSPY     HEAD   CESAREAN SECTION     COLONOSCOPY     ENDOMETRIAL ABLATION     MANDIBLE SURGERY     TONSILLECTOMY     TUBAL LIGATION     UPPER GASTROINTESTINAL ENDOSCOPY      There were no vitals filed for this visit.    Subjective Assessment - 03/19/21 1641     Subjective Pt reports acute exacerbation of LBP while working in the yard ~1 month ago. Had a similar episode lifting a box of paper in 2013. Pain so severe that she had to go to the ED. Pain has subsided but still notes some discomfort with lying in bed. She notes  greatest limitation with mobilty and gait currently due to B knee pain. Working on loosing weight - has lost 12-13 lbs so far.    Pertinent History B knee OA    Limitations Standing;Walking;House hold activities;Lifting    How long can you stand comfortably? 15 minutes    How long can you walk comfortably? typically limited more by knees    Diagnostic tests 02/06/21 - lumbar x-ray: Sacralization of L5. Levocurvature of the lumbar spine. Grade 1   anterolisthesis of L4 on L5. Mild multilevel degenerative disc   disease consisting of osteophyte formation and mild disc space   height loss. Moderate facet arthropathy of the lower lumbar spine.   Soft tissues are unremarkable.     IMPRESSION:   No acute osseous abnormality.    Patient Stated Goals "to be able to get rid of or decrease the LBP"    Currently in Pain? No/denies    Pain Score 0-No pain   up to 3/10 upon waking most days   Pain Location  Back    Pain Orientation Lower    Pain Descriptors / Indicators Dull;Throbbing    Pain Type Acute pain    Pain Radiating Towards denies radicular pain, N/T    Pain Onset More than a month ago    Pain Frequency Intermittent    Aggravating Factors  lying in bed, prolonged sitting    Pain Relieving Factors hot bath in the morning, change of position, heating pad, Voltaren    Effect of Pain on Daily Activities avoids lifting heavy lifting                [facility] PT Assessment - 03/19/21 1638       Assessment   Medical Diagnosis Lumbar strain    Referring Provider (PT) Clearance Coots, MD    Onset Date/Surgical Date 02/06/21    Next MD Visit none scheduled but considering making an appt for her knees    Prior Therapy PT for LBP ~2015      Precautions   Precautions Fall      Restrictions   Weight Bearing Restrictions No      Balance Screen   Has the patient fallen in the past 6 months Yes    How many times? 1   but has had muliple falls in recent years   Has the patient had a decrease in  activity level because of a fear of falling?  Yes    Is the patient reluctant to leave their home because of a fear of falling?  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 and lumbar support chair    Leisure gardening, crochet, cooking, Comptroller   Overall Cognitive Status Within Functional Limits for tasks assessed      Observation/Other Assessments   Focus on Therapeutic Outcomes (FOTO)  Lumbar = 39; predicted D/C FS = 55      Sensation   Light Touch Appears Intact      ROM / Strength   AROM / PROM / Strength AROM;Strength      AROM   AROM Assessment Site Lumbar    Lumbar Flexion hands to ankles    Lumbar Extension 25% limited    Lumbar - Right Side Bend hand to lateral joint line of knee    Lumbar - Left Side Bend hand to lateral joint line of knee    Lumbar - Right Rotation WFL    Lumbar - Left Rotation [facility]      Strength   Strength Assessment Site Hip;Knee;Ankle    Right/Left Hip Right;Left    Right Hip Flexion 4-/5    Right Hip Extension 3-/5    Right Hip External Rotation  4-/5    Right Hip Internal Rotation 4-/5    Right Hip ABduction 3-/5  Right Hip ADduction 4-/5    Left Hip Flexion 4-/5    Left Hip Extension 3-/5    Left Hip External Rotation 4-/5    Left Hip Internal Rotation 4-/5    Left Hip ABduction 3-/5    Left Hip ADduction 4-/5    Right/Left Knee  Right;Left    Right Knee Flexion 4+/5    Right Knee Extension 4+/5    Left Knee Flexion 4+/5    Left Knee Extension 4+/5    Right/Left Ankle Right;Left    Right Ankle Dorsiflexion 4/5    Left Ankle Dorsiflexion 4/5      Flexibility   Soft Tissue Assessment /Muscle Length yes    Hamstrings mild tight B    Quadriceps mild/mod tight quads & hip flexors    ITB mild tight B    Piriformis very mild tightness B                        Objective measurements completed on examination: See above findings.       [facility] Adult PT Treatment/Exercise - 03/19/21 1638       Exercises   Exercises Lumbar      Lumbar Exercises: Stretches   Passive Hamstring Stretch Right;Left;2 reps;30 seconds    Passive Hamstring Stretch Limitations seated hip hinge +/- strap for gastroc stretch    Lower Trunk Rotation Limitations 10 x 5-10"      Lumbar Exercises: Supine   Pelvic Tilt 10 reps;5 seconds    Clam 10 reps;3 seconds    Clam Limitations TrA + bent-knee fall-out    Bridge 5 reps;3 seconds    Bridge Limitations + red TB hip ABD isometric   limited by repeated HS and gastroc cramping                    PT Education - 03/19/21 1740     Education Details PT eval findings, anticipated POC, and initial HEP - Access Code: [email or web address]    Person(s) Educated Patient    Methods Explanation;Demonstration;Verbal cues;Tactile cues;Handout    Comprehension Verbalized understanding;Verbal cues required;Tactile cues required;Returned demonstration;Need further instruction              PT Short Term Goals - 03/19/21 1740       PT SHORT TERM GOAL #1   Title Patient will be independent with initial HEP    Status New    Target Date 04/16/21      PT SHORT TERM GOAL #2   Title Patient will verbalize/demonstrate understanding of neutral spine posture and proper body mechanics to reduce strain on lumbar spine    Status New    Target Date 04/16/21               PT Long  Term Goals - 03/19/21 1740       PT LONG TERM GOAL #1   Title Patient will be independent with ongoing/advanced HEP for self-management at home in order to build upon functional gains in therapy    Status New    Target Date 05/14/21      PT LONG TERM GOAL #2   Title Patient to demonstrate ability to achieve and maintain good spinal alignment/posturing    Status New    Target Date 05/14/21      PT LONG TERM GOAL #3   Title Patient to report reduction in frequency and intensity of LBP by >/= 50-75% to allow for improved activity tolerance  Status New    Target Date 05/14/21      PT LONG TERM GOAL #4   Title Patient will demonstrate improved B proximal LE strength to >/= 4/5 to 4+/5 for improved stability and ease of mobility    Status New    Target Date 05/14/21      PT LONG TERM GOAL #5   Title Patient to report ability to perform ADLs, household, and leisure activities without limitation due to LBP, LOM or weakness    Status New    Target Date 05/14/21                    Plan - 03/19/21 1740     Clinical Impression Statement Tina Hinton is 65 y/o female who presents to OP PT ~6 weeks s/p acute lumbar strain while working in her garden. Pain was so severe at time of injury that she went to the ED but x-rays negative other than DDD and L5-S1 spondylolisthesis. Pain has subsided and become more intermittent currently localized to midline lower lumbar spine, with pain typically occurring if she sits or stands for too long or while lying in bed. Deficits include abnormal posture, mildly decreased lumbar ROM in all planes, mildly decreased proximal LE flexibility, increased muscle tension in lumbar paraspinals, and moderate to severe proximal LE weakness (L>R). In addition to back pain, pt reports B knee pain/OA which contributes to her limited mobility and activity tolerance. Tina Hinton will benefit from skilled PT to address above deficits and improve functional ROM and strength with  decreased pain to increase tolerance for ADLs and daily activities. Depending on tolerance for land-based therapy due to her knee OA/pain, she may benefit from aquatic PT as well. She may also benefit from further balance assessment given her h/o multiple falls.    Personal Factors and Comorbidities Age;Comorbidity 3+;Fitness;Past/Current Experience;Time since onset of injury/illness/exacerbation    Comorbidities B knee OA, asthma, IBS, obesity, anxiety and panic disorders, depression, multiple falls, R ankle fracture - lateral malleolus, L navicular fracture    Examination-Activity Limitations Bed Mobility;Bend;Lift;Carry;Reach Overhead;Locomotion Level;Sit;Stand;Squat;Stairs    Examination-Participation Restrictions Cleaning;Community Activity;Laundry;Meal Prep;Shop;Yard Work    Stability/Clinical Decision Making Stable/Uncomplicated    Clinical Decision Making Low    Rehab Potential Good    PT Frequency 2x / week    PT Duration 8 weeks    PT Treatment/Interventions ADLs/Self Care Home Management;Aquatic Therapy;Cryotherapy;Electrical Stimulation;Iontophoresis 4mg /ml Dexamethasone;Moist Heat;Traction;Ultrasound;DME Instruction;Gait training;Stair training;Functional mobility training;Therapeutic activities;Therapeutic exercise;Balance training;Neuromuscular re-education;Patient/family education;Manual techniques;Passive range of motion;Dry needling;Taping;Spinal Manipulations    PT Next Visit Plan Review initial HEP; progress lumbopelvic flexibilty and strengthening as tolerated; manual therapy and modalities as tolerated    PT Home Exercise Plan Access Code: [email or web address]    Consulted and Agree with Plan of Care Patient             Patient will benefit from skilled therapeutic intervention in order to improve the following deficits and impairments:  Cardiopulmonary status limiting activity, Decreased activity tolerance, Decreased balance, Decreased endurance, Decreased mobility, Decreased range of  motion, Decreased strength, Difficulty walking, Increased fascial restricitons, Increased muscle spasms, Impaired perceived functional ability, Impaired flexibility, Improper body mechanics, Postural dysfunction, Obesity, Pain  Visit Diagnosis: Acute midline low back pain without sciatica  Muscle weakness (generalized)  Abnormal posture  Muscle spasm of back     Problem List Patient Active Problem List   Diagnosis Date Noted   Class 3 severe obesity with serious comorbidity and body mass index (BMI) of 45.0 to  49.9 in adult [facility]) 03/12/2021   Prediabetes 03/12/2021   Lumbar strain, subsequent encounter 02/14/2021   Asthma    Vertigo 01/31/2020   Aortic atherosclerosis ([place]) 02/15/2018   IBS (irritable bowel syndrome) - D 01/19/2017   Primary osteoarthritis of both knees 01/19/2017   Preventative health care 07/30/2015   Morbid obesity ([place]) 07/30/2015   Anxiety 06/25/2015   Hyperlipidemia LDL goal <100 06/25/2015   Sprain of medial collateral ligament of left knee 06/25/2015   Osteoarthritis of both knees 06/25/2015   Degeneration of intervertebral disc of lumbar region 01/04/2015   Spondylolisthesis at L5-S1 level 01/04/2015   Fracture of lateral malleolus 11/14/2014   Foot, fracture, navicular 11/14/2014    Percival Spanish, PT 03/19/2021, [facility] [address]  [place], [place], [address] Phone: [phone]   Fax:  [phone]  Name: Tina Hinton MRN: [id number] Date of Birth: 04/06/1956

## 2021-03-22 ENCOUNTER — Other Ambulatory Visit (INDEPENDENT_AMBULATORY_CARE_PROVIDER_SITE_OTHER): Payer: Self-pay | Admitting: Family Medicine

## 2021-03-22 DIAGNOSIS — R7303 Prediabetes: Secondary | ICD-10-CM

## 2021-03-25 ENCOUNTER — Telehealth (INDEPENDENT_AMBULATORY_CARE_PROVIDER_SITE_OTHER): Payer: Federal, State, Local not specified - PPO | Admitting: Psychology

## 2021-03-25 DIAGNOSIS — F4323 Adjustment disorder with mixed anxiety and depressed mood: Secondary | ICD-10-CM

## 2021-03-25 DIAGNOSIS — F5089 Other specified eating disorder: Secondary | ICD-10-CM | POA: Diagnosis not present

## 2021-03-26 NOTE — Progress Notes (Signed)
  Office: [phone]  /  Fax: [phone]    Date: April 09, 2021   Appointment Start Time: 8:03am Duration: 30 minutes Provider: Glennie Isle, Psy.D. Type of Session: Individual Therapy  Location of Patient: Home (private location) Location of Provider: Provider's Home (private office) Type of Contact: Telepsychological Visit via MyChart Video Visit  Session Content: Tina Hinton is a 65 y.o. female presenting for a follow-up appointment to address the previously established treatment goal of increasing coping skills.Today's appointment was a telepsychological visit due to COVID-19. Tina Hinton provided verbal consent for today's telepsychological appointment and she is aware she is responsible for securing confidentiality on her end of the session. Prior to proceeding with today's appointment, Tina Hinton's physical location at the time of this appointment was obtained as well a phone number she could be reached at in the event of technical difficulties. Tina Hinton and this provider participated in today's telepsychological service.   This provider conducted a brief check-in. Tina Hinton shared about her recent trip with friends, adding she did "very well" with her water intake and eating habits. She described feeling in control and noted a reduction in emotional eating behaviors. Briefly discussed assertive, aggressive, and passive communication. Moreover, Tina Hinton expressed some concern about the holidays. Thus, session focused on problem solving for the holidays. Furthermore, psychoeducation regarding making better choices and engaging in portion control during the holidays/celebrations was provided. More specifically, this provider discussed the following strategies: coming to meals hungry, but not starving; avoid filling up on appetizers; managing portion sizes; not completely depriving yourself; making the plate colorful (e.g., vegetables); pacing yourself (e.g., waiting 10 minutes before going back for  seconds); taking advantage of the nutritious foods; practicing mindfulness; staying hydrated; and avoid bringing home leftovers. Overall, Tina Hinton was receptive to today's appointment as evidenced by openness to sharing, responsiveness to feedback, and willingness to implement discussed strategies .  Mental Status Examination:  Appearance: well groomed and appropriate hygiene  Behavior: appropriate to circumstances Mood: euthymic Affect: mood congruent Speech: normal in rate, volume, and tone Eye Contact: appropriate Psychomotor Activity: appropriate Gait: unable to assess Thought Process: linear, logical, and goal directed  Thought Content/Perception: no hallucinations, delusions, bizarre thinking or behavior reported or observed and no evidence or endorsement of suicidal and homicidal ideation, plan, and intent Orientation: time, person, place, and purpose of appointment Memory/Concentration: memory, attention, language, and fund of knowledge intact  Insight/Judgment: good  Interventions:  Conducted a brief chart review Provided empathic reflections and validation Provided positive reinforcement Employed supportive psychotherapy interventions to facilitate reduced distress and to improve coping skills with identified stressors Discussed communication styles Discussed strategies for the holidays/strategies Engaged patient in problem solving   DSM-5 Diagnosis(es):  F50.89 Other Specified Feeding or Eating Disorder, Emotional Eating Behaviors and F43.23 Adjustment Disorder With Mixed Anxiety and Depressed Mood  Treatment Goal & Progress: During the initial appointment with this provider, the following treatment goal was established: increase coping skills. Tina Hinton has demonstrated progress in her goal as evidenced by increased awareness of hunger patterns, increased awareness of triggers for emotional eating behaviors, and reduction in emotional eating behaviors . Tina Hinton also continues to  demonstrate willingness to engage in learned skill(s).  Plan: Based on progress to date per [name]'s self-report, the next appointment will be scheduled in three weeks, which will be via [facility] Visit. The next session will focus on working towards the established treatment goal.

## 2021-03-27 ENCOUNTER — Other Ambulatory Visit: Payer: Self-pay

## 2021-03-27 ENCOUNTER — Ambulatory Visit (INDEPENDENT_AMBULATORY_CARE_PROVIDER_SITE_OTHER): Payer: Federal, State, Local not specified - PPO | Admitting: Family Medicine

## 2021-03-27 ENCOUNTER — Encounter (INDEPENDENT_AMBULATORY_CARE_PROVIDER_SITE_OTHER): Payer: Self-pay | Admitting: Family Medicine

## 2021-03-27 VITALS — BP 139/81 | HR 72 | Ht 61.0 in | Wt 243.0 lb

## 2021-03-27 DIAGNOSIS — E66813 Obesity, class 3: Secondary | ICD-10-CM

## 2021-03-27 DIAGNOSIS — F5089 Other specified eating disorder: Secondary | ICD-10-CM

## 2021-03-27 DIAGNOSIS — R7303 Prediabetes: Secondary | ICD-10-CM | POA: Diagnosis not present

## 2021-03-27 DIAGNOSIS — Z6841 Body Mass Index (BMI) 40.0 and over, adult: Secondary | ICD-10-CM

## 2021-03-27 DIAGNOSIS — Z9189 Other specified personal risk factors, not elsewhere classified: Secondary | ICD-10-CM | POA: Diagnosis not present

## 2021-03-27 MED ORDER — METFORMIN HCL 500 MG PO TABS: ORAL_TABLET | ORAL | 0 refills | Status: DC

## 2021-03-28 ENCOUNTER — Ambulatory Visit: Payer: Self-pay

## 2021-03-28 ENCOUNTER — Ambulatory Visit: Payer: Federal, State, Local not specified - PPO | Admitting: Family Medicine

## 2021-03-28 VITALS — BP 120/76 | Ht 61.0 in | Wt 241.0 lb

## 2021-03-28 DIAGNOSIS — M170 Bilateral primary osteoarthritis of knee: Secondary | ICD-10-CM | POA: Diagnosis not present

## 2021-03-28 DIAGNOSIS — M1711 Unilateral primary osteoarthritis, right knee: Secondary | ICD-10-CM

## 2021-03-28 NOTE — Progress Notes (Signed)
Chief Complaint:   OBESITY Tina Hinton is here to discuss her progress with her obesity treatment plan along with follow-up of her obesity related diagnoses. Tina Hinton is on the Category 1 Plan and states she is following her eating plan approximately 90-95% of the time. Tina Hinton states she is doing 0 minutes 0 times per week.  Today's visit was #: 4 Starting weight: 253 lbs Starting date: 02/12/2021 Today's weight: 243 lbs Today's date: 03/28/2021 Total lbs lost to date: 10 Total lbs lost since last in-office visit: 1  Interim History: Tina Hinton is here for a follow up office visit.  We reviewed her meal plan and questions were answered.  Patient's food recall appears to be accurate and consistent with what is on plan when she is following it. When eating on plan, her hunger and cravings are well controlled. Tina Hinton has really been focusing on not skipping breakfast, and she is working with Dr. Mallie Mussel on identifying emotional eating triggers. She only drinks 67 oz per day of water.  Subjective:   1. Pre-diabetes Tina Hinton has a diagnosis of prediabetes based on her elevated HgA1c and was informed this puts her at greater risk of developing diabetes. She continues to work on diet and exercise to decrease her risk of diabetes. She denies nausea or hypoglycemia.  2.  Other Specified Feeding or Eating Disorder, Emotional Eating Behaviors  Tina Hinton denies depression or emotional instability. She likes working with Dr. Mallie Mussel.   3. At risk for deficient intake of food Tina Hinton is at a higher than average risk of deficient intake of food due to inadequate intake.  Assessment/Plan:  No orders of the defined types were placed in this encounter.   Medications Discontinued During This Encounter  Medication Reason   metFORMIN (GLUCOPHAGE) 500 MG tablet Reorder     Meds ordered this encounter  Medications   metFORMIN (GLUCOPHAGE) 500 MG tablet    Sig: 1 po qd with lunch    Dispense:   30 tablet    Refill:  0    30 d supply;  ** OV for RF **   Do not send RF request     1. Pre-diabetes We will refill metformin at the same dose. Tina Hinton is to not skip on protein and fiber. Dietary intake was reviewed with the patient today. She will continue to work on weight loss, exercise, and decreasing simple carbohydrates to help decrease the risk of diabetes.   - metFORMIN (GLUCOPHAGE) 500 MG tablet; 1 po qd with lunch  Dispense: 30 tablet; Refill: 0  2.  Other Specified Feeding or Eating Disorder, Emotional Eating Behaviors  I recommended Vieva to continue with Dr. Mallie Mussel, and continue doing mindful eating.  3. At risk for deficient intake of food Tina Hinton was given approximately 15 minutes of deficit intake of food prevention counseling today. Tina Hinton is at risk for eating too few calories based on current food recall. She was encouraged to focus on meeting caloric and protein goals according to her recommended meal plan.   4. Obesity with current BMI of 46.0 Tina Hinton is currently in the action stage of change. As such, her goal is to continue with weight loss efforts. She has agreed to the Category 1 Plan.   Tina Hinton is to not skip food. She may use wraps as a substitute for bread for instance. I reviewed with the patient the role of fiber and protein in controlling hunger and cravings.   Exercise goals: Start 5-10 minutes of walking per  day.  Behavioral modification strategies: increasing lean protein intake, increasing water intake, no skipping meals, and avoiding temptations.  Tina Hinton has agreed to follow-up with our clinic in 2 weeks with Tina - [place], FNP-C, and in 4 weeks with myself. She was informed of the importance of frequent follow-up visits to maximize her success with intensive lifestyle modifications for her multiple health conditions.   Objective:   Blood pressure 139/81, pulse 72, height 5\' 1"  (1.549 m), weight 243 lb (110.2 kg), SpO2 96 %. Body mass index is  45.91 kg/m.  General: Cooperative, alert, well developed, in no acute distress. HEENT: Conjunctivae and lids unremarkable. Cardiovascular: Regular rhythm.  Lungs: Normal work of breathing. Neurologic: No focal deficits.   Lab Results  Component Value Date   CREATININE 0.76 11/16/2020   BUN 18 11/16/2020   NA 142 11/16/2020   K 4.9 11/16/2020   CL 104 11/16/2020   CO2 28 11/16/2020   Lab Results  Component Value Date   ALT 13 11/16/2020   AST 18 11/16/2020   ALKPHOS 77 08/02/2019   BILITOT 0.2 11/16/2020   Lab Results  Component Value Date   HGBA1C 5.9 (H) 02/12/2021   Lab Results  Component Value Date   INSULIN 11.8 02/12/2021   Lab Results  Component Value Date   TSH 1.42 11/16/2020   Lab Results  Component Value Date   CHOL 152 11/16/2020   HDL 43 (L) 11/16/2020   LDLCALC 80 11/16/2020   LDLDIRECT 155.0 06/25/2015   TRIG 196 (H) 11/16/2020   CHOLHDL 3.5 11/16/2020   No results found for: VD25OH Lab Results  Component Value Date   WBC 8.7 11/16/2020   HGB 12.2 11/16/2020   HCT 36.8 11/16/2020   MCV 89.8 11/16/2020   PLT 250 11/16/2020   No results found for: IRON, TIBC, FERRITIN  Attestation Statements:   Reviewed by clinician on day of visit: allergies, medications, problem list, medical history, surgical history, family history, social history, and previous encounter notes.   Wilhemena Durie, am acting as transcriptionist for [employer], DO.  I have reviewed the above documentation for accuracy and completeness, and I agree with the above. Marjory Sneddon, D.O.  The [place] was signed into law in 2016 which includes the topic of electronic health records.  This provides immediate access to information in MyChart.  This includes consultation notes, operative notes, office notes, lab results and pathology reports.  If you have any questions about what you read please let us know at your next visit so we can discuss your  concerns and take corrective action if need be.  We are right here with you.

## 2021-03-28 NOTE — Progress Notes (Signed)
Tina Hinton - 65 y.o. female MRN [id number]  Date of birth: 08/17/1955  SUBJECTIVE:  Including CC & ROS.  No chief complaint on file.   Tina Hinton is a 65 y.o. female that is presenting with acute right knee pain.  Her back is improved with physical therapy.  Historically has had left knee pain as well.   Review of Systems See HPI   HISTORY: Past Medical, Surgical, Social, and Family History Reviewed & Updated per EMR.   Pertinent Historical Findings include:  Past Medical History:  Diagnosis Date   Ankle fracture    Right   Anxiety    Arthritis    hands, knees   Asthma    Bilateral knee pain    Chest pain    Chicken pox    Clavicle fracture    Left   Depression    Foot fracture, left    GERD (gastroesophageal reflux disease)    Hyperlipidemia    IBS (irritable bowel syndrome) 01/19/2017   Joint pain    Low back pain    Osteoarthritis    Other fatigue    Palpitations    Panic attacks    Seasonal allergies    Shortness of breath on exertion    Swallowing difficulty    Uterine polyp    ablation    Past Surgical History:  Procedure Laterality Date   BIOSPY     HEAD   CESAREAN SECTION     COLONOSCOPY     ENDOMETRIAL ABLATION     MANDIBLE SURGERY     TONSILLECTOMY     TUBAL LIGATION     UPPER GASTROINTESTINAL ENDOSCOPY      Family History  Problem Relation Age of Onset   Obesity Mother    Cancer Mother    Arthritis Mother    Breast cancer Mother 102   Arthritis Maternal Grandmother    Heart disease Maternal Grandmother    Diabetes Maternal Grandmother    Breast cancer Paternal Grandmother    Prostate cancer Paternal Grandfather    Cancer Maternal Aunt    Breast cancer Maternal Aunt    Obesity Other    Colon cancer Neg Hx    Esophageal cancer Neg Hx    Rectal cancer Neg Hx    Stomach cancer Neg Hx     Social History   Socioeconomic History   Marital status: Divorced    Spouse name: Not on file   Number of children: Not on  file   Years of education: Not on file   Highest education level: Not on file  Occupational History   Occupation: Surveyor, quantity: [employer]  Tobacco Use   Smoking status: Never   Smokeless tobacco: Never  Vaping Use   Vaping Use: Never used  Substance and Sexual Activity   Alcohol use: Yes    Alcohol/week: 0.0 standard drinks    Comment: occ   Drug use: No   Sexual activity: Not Currently  Other Topics Concern   Not on file  Social History Narrative   No regular exercise.   Social Determinants of Health   Financial Resource Strain: Not on file  Food Insecurity: Not on file  Transportation Needs: Not on file  Physical Activity: Not on file  Stress: Not on file  Social Connections: Not on file  Intimate Partner Violence: Not on file     PHYSICAL EXAM:  VS: BP 120/76   Ht 5\' 1"  (1.549 m)  Wt 241 lb (109.3 kg)   BMI 45.54 kg/m  Physical Exam Gen: NAD, alert, cooperative with exam, well-appearing   Limited ultrasound: Right knee:  Trace effusion. Normal-appearing quadricep and patellar tendon. Mild medial joint space narrowing. Moderate lateral joint space narrowing  Summary: Osteoarthritic changes of the knee  Ultrasound and interpretation by Clearance Coots, MD    ASSESSMENT & PLAN:   Primary osteoarthritis of both knees Acute on chronic in nature.  Having more right-sided pain as opposed to left today.  Has tried injections in the past. -Counseled on home exercise therapy and supportive care. -Referral to physical therapy. -Could consider imaging or Zilretta injection.

## 2021-03-28 NOTE — Assessment & Plan Note (Signed)
Acute on chronic in nature.  Having more right-sided pain as opposed to left today.  Has tried injections in the past. -Counseled on home exercise therapy and supportive care. -Referral to physical therapy. -Could consider imaging or Zilretta injection.

## 2021-03-28 NOTE — Patient Instructions (Signed)
Good to see you Please use ice as needed  Please try physical therapy   Please send me a message in MyChart with any questions or updates.  Please see me back in 4 weeks.   --Dr. Raeford Razor

## 2021-04-02 ENCOUNTER — Other Ambulatory Visit: Payer: Self-pay

## 2021-04-02 ENCOUNTER — Encounter: Payer: Self-pay | Admitting: Physical Therapy

## 2021-04-02 ENCOUNTER — Ambulatory Visit: Payer: Federal, State, Local not specified - PPO | Admitting: Physical Therapy

## 2021-04-02 DIAGNOSIS — M25561 Pain in right knee: Secondary | ICD-10-CM | POA: Diagnosis not present

## 2021-04-02 DIAGNOSIS — H6121 Impacted cerumen, right ear: Secondary | ICD-10-CM | POA: Diagnosis not present

## 2021-04-02 DIAGNOSIS — M6281 Muscle weakness (generalized): Secondary | ICD-10-CM | POA: Diagnosis not present

## 2021-04-02 DIAGNOSIS — R42 Dizziness and giddiness: Secondary | ICD-10-CM | POA: Diagnosis not present

## 2021-04-02 DIAGNOSIS — M545 Low back pain, unspecified: Secondary | ICD-10-CM | POA: Diagnosis not present

## 2021-04-02 DIAGNOSIS — M25562 Pain in left knee: Secondary | ICD-10-CM | POA: Diagnosis not present

## 2021-04-02 DIAGNOSIS — R293 Abnormal posture: Secondary | ICD-10-CM

## 2021-04-02 DIAGNOSIS — H903 Sensorineural hearing loss, bilateral: Secondary | ICD-10-CM | POA: Diagnosis not present

## 2021-04-02 DIAGNOSIS — M62830 Muscle spasm of back: Secondary | ICD-10-CM
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
## 2021-04-02 NOTE — Patient Instructions (Signed)
    Access Code: [email or web address] URL: [email or web address] Date: 04/02/2021 Prepared by: Annie Paras  Exercises Supine Posterior Pelvic Tilt - 2 x daily - 7 x weekly - 2 sets - 10 reps - 5 sec hold Bent Knee Fallouts with Alternating Legs - 2 x daily - 7 x weekly - 2 sets - 10 reps - 3-5 sec hold Supine Lower Trunk Rotation - 2 x daily - 7 x weekly - 10 reps - 5-10 sec hold Seated Hamstring Stretch with Strap - 2-3 x daily - 7 x weekly - 3 reps - 30 sec hold Supine Hip Adduction Isometric with Ball - 1 x daily - 7 x weekly - 2 sets - 10 reps - 5 sec hold Hooklying Isometric Clamshell - 1 x daily - 7 x weekly - 2 sets - 10 reps - 3-5 sec hold Supine Bridge with Resistance Band - 1 x daily - 7 x weekly - 2 sets - 10 reps - 5 sec hold

## 2021-04-08 ENCOUNTER — Encounter: Payer: Self-pay | Admitting: Physical Therapy

## 2021-04-08 ENCOUNTER — Other Ambulatory Visit: Payer: Self-pay

## 2021-04-08 ENCOUNTER — Ambulatory Visit: Payer: Federal, State, Local not specified - PPO | Admitting: Physical Therapy

## 2021-04-08 DIAGNOSIS — M6281 Muscle weakness (generalized): Secondary | ICD-10-CM | POA: Diagnosis not present

## 2021-04-08 DIAGNOSIS — M62830 Muscle spasm of back: Secondary | ICD-10-CM

## 2021-04-08 DIAGNOSIS — R293 Abnormal posture: Secondary | ICD-10-CM

## 2021-04-08 DIAGNOSIS — M25562 Pain in left knee: Secondary | ICD-10-CM | POA: Diagnosis not present

## 2021-04-08 DIAGNOSIS — M545 Low back pain, unspecified: Secondary | ICD-10-CM

## 2021-04-08 DIAGNOSIS — M25561 Pain in right knee: Secondary | ICD-10-CM

## 2021-04-08 NOTE — Therapy (Signed)
[facility] [address]  [place], [place], [address] Phone: [phone]   Fax:  [phone]  Physical Therapy Treatment  Patient Details  Name: Tina Hinton MRN: [id number] Date of Birth: 27-Sep-1955 Referring Provider (PT): Clearance Coots, MD   Encounter Date: 04/08/2021   PT End of Session - 04/08/21 1653     Visit Number 3    Number of Visits 16    Date for PT Re-Evaluation 05/14/21    Authorization Type Federal BC[employer] - VL:50 (PT/OT/ST combined - 0 used)    PT Start Time 1653    PT Stop Time 1743    PT Time Calculation (min) 50 min    Activity Tolerance Patient tolerated treatment well    Behavior During Therapy [facility] for tasks assessed/performed             Past Medical History:  Diagnosis Date   Ankle fracture    Right   Anxiety    Arthritis    hands, knees   Asthma    Bilateral knee pain    Chest pain    Chicken pox    Clavicle fracture    Left   Depression    Foot fracture, left    GERD (gastroesophageal reflux disease)    Hyperlipidemia    IBS (irritable bowel syndrome) 01/19/2017   Joint pain    Low back pain    Osteoarthritis    Other fatigue    Palpitations    Panic attacks    Seasonal allergies    Shortness of breath on exertion    Swallowing difficulty    Uterine polyp    ablation    Past Surgical History:  Procedure Laterality Date   BIOSPY     HEAD   CESAREAN SECTION     COLONOSCOPY     ENDOMETRIAL ABLATION     MANDIBLE SURGERY     TONSILLECTOMY     TUBAL LIGATION     UPPER GASTROINTESTINAL ENDOSCOPY      There were no vitals filed for this visit.   Subjective Assessment - 04/08/21 1656     Subjective Pt reports she went away for the weekend and did some walking and up/down stairs which seemed to irritate her R knee. Back has been okay.    Pertinent History B knee OA    Diagnostic tests 02/06/21 - lumbar x-ray: Sacralization of L5. Levocurvature of the  lumbar spine. Grade 1   anterolisthesis of L4 on L5. Mild multilevel degenerative disc   disease consisting of osteophyte formation and mild disc space   height loss. Moderate facet arthropathy of the lower lumbar spine.   Soft tissues are unremarkable.     IMPRESSION:   No acute osseous abnormality.  03/28/21 - Limited ultrasound: Right knee:  Trace effusion.  Normal-appearing quadricep and patellar tendon.  Mild medial joint space narrowing.  Moderate lateral joint space narrowing   Summary: Osteoarthritic changes of the knee    Patient Stated Goals "to be able to get rid of or decrease the LBP"    Currently in Pain? Yes    Pain Score 0-No pain    Pain Location Back    Pain Orientation Lower    Pain Score 2    Pain Location Knee    Pain Orientation Right    Pain Descriptors / Indicators Dull;Throbbing    Pain Type Acute pain;Chronic pain    Pain Frequency Intermittent  [place] [facility] PT Treatment/Exercise - 04/08/21 1653       Lumbar Exercises: Stretches   Passive Hamstring Stretch Right;Left;3 reps;30 seconds    Passive Hamstring Stretch Limitations seated hip hinge + strap for gastroc stretch    Lower Trunk Rotation Limitations 10 x 5"    Other Lumbar Stretch Exercise Seated 3-way trunk flexion stretch with hands resting on green Pball x 30 sec each      Lumbar Exercises: Aerobic   Recumbent Bike L2 x 6 min      Lumbar Exercises: Supine   Pelvic Tilt 10 reps;5 seconds    Clam 10 reps;3 seconds   2 sets   Clam Limitations TrA + bent-knee fall-out & hooklying alt red TB hip ABD/ER - 1 set each    Bridge 10 reps;3 seconds    Bridge Limitations + red TB hip ABD isometric      Knee/Hip Exercises: Standing   Hip Flexion Right;Left;10 reps;Stengthening;Knee straight    Hip Flexion Limitations cues for abd bracing & quad set - UE support on counter    Hip Abduction Right;Left;10 reps;Stengthening;Knee straight    Abduction Limitations  cues for abd bracing, avoiding lateral trunk lean - UE support on counter    Hip Extension Right;Left;10 reps;Stengthening;Knee straight    Extension Limitations cues for abd bracing, avoiding excessive trunk flexion and hip flexion on stance LE - UE support on counter      Knee/Hip Exercises: Supine   Short Arc Quad Sets Right;Left;10 reps;Strengthening    Short Arc Quad Sets Limitations + hip ADD ball squeeze with thighs resting on 8" FR    Hip Adduction Isometric Both;10 reps;Strengthening    Hip Adduction Isometric Limitations ball squeeze + PPT    Straight Leg Raises Right;AROM;10 reps;Left;AAROM;5 reps;Strengthening    Straight Leg Raises Limitations cues for abd bracing and quad set prior to lifting leg   increased effort and anterior hip pain reported on L                      PT Short Term Goals - 04/08/21 1724       PT SHORT TERM GOAL #1   Title Patient will be independent with initial HEP    Status Achieved   04/08/21     PT SHORT TERM GOAL #2   Title Patient will verbalize/demonstrate understanding of neutral spine posture and proper body mechanics to reduce strain on lumbar spine    Status On-going    Target Date 04/16/21               PT Long Term Goals - 04/08/21 1658       PT LONG TERM GOAL #1   Title Patient will be independent with ongoing/advanced HEP for self-management at home in order to build upon functional gains in therapy    Status On-going    Target Date 05/14/21      PT LONG TERM GOAL #2   Title Patient to demonstrate ability to achieve and maintain good spinal alignment/posturing    Status On-going    Target Date 05/14/21      PT LONG TERM GOAL #3   Title Patient to report reduction in frequency and intensity of LBP by >/= 50-75% to allow for improved activity tolerance    Status On-going    Target Date 05/14/21      PT LONG TERM GOAL #4   Title Patient will demonstrate improved B proximal LE strength to >/= 4/5  to 4+/5 for  improved stability and ease of mobility    Status On-going    Target Date 05/14/21      PT LONG TERM GOAL #5   Title Patient to report ability to perform ADLs, household, and leisure activities without limitation due to LBP, LOM or weakness    Status On-going    Target Date 05/14/21      PT LONG TERM GOAL #6   Title Patient will improve standing and/or walking tolerance to >/= 30 minutes w/o pain interference to allow resumption of normal daily activities    Status On-going    Target Date 05/14/21      PT LONG TERM GOAL #7   Title Patient will report no further instance/sensation of knee buckling during ambulation    Status On-going    Target Date 05/14/21                   Plan - 04/08/21 1659     Clinical Impression Statement Cecille Rubin reports that the exercises seem to be helping as her back has not been bothering her as much but does note increased R knee pain after going away over the weekend where she was walking more and climbing stairs. HEP reviewed with pt able to demonstrate good recall and perform return demonstration with only minor cues/clarification necessary - STG #1 met. Pt struggling more with L LE during hip and knee strengthening with pt noting her L LE feels 2x as heavy than her R LE, although standing SLRs better tolerated than supine.    Comorbidities B knee OA, asthma, IBS, obesity, anxiety and panic disorders, depression, multiple falls, R ankle fracture - lateral malleolus, L navicular fracture    Rehab Potential Good    PT Frequency 2x / week    PT Duration 8 weeks    PT Treatment/Interventions ADLs/Self Care Home Management;Aquatic Therapy;Cryotherapy;Electrical Stimulation;Iontophoresis 44m/ml Dexamethasone;Moist Heat;Traction;Ultrasound;DME Instruction;Gait training;Stair training;Functional mobility training;Therapeutic activities;Therapeutic exercise;Balance training;Neuromuscular re-education;Patient/family education;Manual techniques;Passive range of  motion;Dry needling;Taping;Spinal Manipulations;Vasopneumatic Device;Joint Manipulations    PT Next Visit Plan posture & body mechanics education; progress lumbopelvic flexibilty and strengthening as tolerated - update HEP as indicated; manual therapy and modalities as tolerated    PT Home Exercise Plan Access Code: [email or web address] (10/11, updated 10/25)    Consulted and Agree with Plan of Care Patient             Patient will benefit from skilled therapeutic intervention in order to improve the following deficits and impairments:  Cardiopulmonary status limiting activity, Decreased activity tolerance, Decreased balance, Decreased endurance, Decreased mobility, Decreased range of motion, Decreased strength, Difficulty walking, Increased fascial restricitons, Increased muscle spasms, Impaired perceived functional ability, Impaired flexibility, Improper body mechanics, Postural dysfunction, Obesity, Pain  Visit Diagnosis: Acute midline low back pain without sciatica  Muscle weakness (generalized)  Abnormal posture  Muscle spasm of back  Acute pain of right knee  Acute pain of left knee     Problem List Patient Active Problem List   Diagnosis Date Noted   Class 3 severe obesity with serious comorbidity and body mass index (BMI) of 45.0 to 49.9 in adult ([facility] [place] 03/12/2021   Prediabetes 03/12/2021   Lumbar strain, subsequent encounter 02/14/2021   Asthma    Vertigo 01/31/2020   Aortic atherosclerosis (HCC) 02/15/2018   IBS (irritable bowel syndrome) - D 01/19/2017   Primary osteoarthritis of both knees 01/19/2017   Preventative health care 07/30/2015   Morbid obesity ([facility] 07/30/2015   Anxiety 06/25/2015  Hyperlipidemia LDL goal <100 06/25/2015   Sprain of medial collateral ligament of left knee 06/25/2015   Osteoarthritis of both knees 06/25/2015   Degeneration of intervertebral disc of lumbar region 01/04/2015   Spondylolisthesis at L5-S1 level 01/04/2015   Fracture of lateral  malleolus 11/14/2014   Foot, fracture, navicular 11/14/2014    Percival Spanish, PT 04/08/2021, 5:47 PM  [facility] [address]  [place], [place], [address] Phone: [phone]   Fax:  [phone]  Name: Regenia Erck MRN: [id number] Date of Birth: October 05, 1955

## 2021-04-09 ENCOUNTER — Telehealth (INDEPENDENT_AMBULATORY_CARE_PROVIDER_SITE_OTHER): Payer: Federal, State, Local not specified - PPO | Admitting: Psychology

## 2021-04-09 DIAGNOSIS — F5089 Other specified eating disorder: Secondary | ICD-10-CM | POA: Diagnosis not present

## 2021-04-09 DIAGNOSIS — F4323 Adjustment disorder with mixed anxiety and depressed mood: Secondary | ICD-10-CM | POA: Diagnosis not present

## 2021-04-10 ENCOUNTER — Ambulatory Visit (INDEPENDENT_AMBULATORY_CARE_PROVIDER_SITE_OTHER): Payer: Federal, State, Local not specified - PPO | Admitting: Family Medicine

## 2021-04-10 ENCOUNTER — Other Ambulatory Visit: Payer: Self-pay

## 2021-04-10 ENCOUNTER — Encounter (INDEPENDENT_AMBULATORY_CARE_PROVIDER_SITE_OTHER): Payer: Self-pay | Admitting: Family Medicine

## 2021-04-10 VITALS — BP 144/68 | HR 61 | Temp 97.4°F | Ht 61.0 in | Wt 240.0 lb

## 2021-04-10 DIAGNOSIS — Z6841 Body Mass Index (BMI) 40.0 and over, adult: Secondary | ICD-10-CM

## 2021-04-10 DIAGNOSIS — R7303 Prediabetes: Secondary | ICD-10-CM | POA: Diagnosis not present

## 2021-04-10 DIAGNOSIS — F5089 Other specified eating disorder: Secondary | ICD-10-CM

## 2021-04-10 NOTE — Progress Notes (Signed)
Chief Complaint:   OBESITY Tina Hinton is here to discuss her progress with her obesity treatment plan along with follow-up of her obesity related diagnoses. Tina Hinton is on the Category 1 Plan and states she is following her eating plan approximately 98% of the time. Tina Hinton states she is doing physical therapy for 45 minutes 2 times per week.  Today's visit was #: 5 Starting weight: 253 lbs Starting date: 02/12/2021 Today's weight: 240 lbs Today's date: 04/10/2021 Total lbs lost to date: 13 lbs Total lbs lost since last in-office visit: 3 lbs  Interim History: Tina Hinton went on a weekend trip and adhered to the plan very well. She felt good that she navigated the social situation well and stayed on plan. She is still measuring her portions of all foods and being very strict with the plan.  Subjective:   1. Pre-diabetes Tina Hinton's last A1C was elevated at 5.9. She is on Metformin currently.   Lab Results  Component Value Date   HGBA1C 5.9 (H) 02/12/2021   Lab Results  Component Value Date   INSULIN 11.8 02/12/2021    2.  Other Specified Feeding or Eating Disorder, Emotional Eating Behaviors  Tina Hinton feels her emotional eating is well controlled. She is working with Dr. Mallie Mussel and enjoying this. She is proud of how well she did on her weekend trip. She states her cravings are controlled.   Assessment/Plan:   1. Pre-diabetes Tina Hinton will continue Metformin. She will continue to work on weight loss, exercise, and decreasing simple carbohydrates to help decrease the risk of diabetes.   2.  Other Specified Feeding or Eating Disorder, Emotional Eating Behaviors   Tina Hinton will follow up with Dr. Mallie Mussel. Orders and follow up as documented in patient record.    3. Obesity with current BMI of 45.37 Tina Hinton is currently in the action stage of change. As such, her goal is to continue with weight loss efforts. She has agreed to the Category 1 Plan and keeping a food journal and adhering to  recommended goals of 300-400 calories and 35 grams of protein.   Handouts: Recipes, [employer] and [employer].   Exercise goals:  As is.  Behavioral modification strategies: meal planning and cooking strategies, better snacking choices, and holiday eating strategies .  Tina Hinton has agreed to follow-up with our clinic in 2 weeks with Dr. Raliegh Scarlet.  Objective:   Blood pressure (!) 144/68, pulse 61, temperature (!) 97.4 F (36.3 C), height 5\' 1"  (1.549 m), weight 240 lb (108.9 kg), SpO2 98 %. Body mass index is 45.35 kg/m.  General: Cooperative, alert, well developed, in no acute distress. HEENT: Conjunctivae and lids unremarkable. Cardiovascular: Regular rhythm.  Lungs: Normal work of breathing. Neurologic: No focal deficits.   Lab Results  Component Value Date   CREATININE 0.76 11/16/2020   BUN 18 11/16/2020   NA 142 11/16/2020   K 4.9 11/16/2020   CL 104 11/16/2020   CO2 28 11/16/2020   Lab Results  Component Value Date   ALT 13 11/16/2020   AST 18 11/16/2020   ALKPHOS 77 08/02/2019   BILITOT 0.2 11/16/2020   Lab Results  Component Value Date   HGBA1C 5.9 (H) 02/12/2021   Lab Results  Component Value Date   INSULIN 11.8 02/12/2021   Lab Results  Component Value Date   TSH 1.42 11/16/2020   Lab Results  Component Value Date   CHOL 152 11/16/2020   HDL 43 (L) 11/16/2020   LDLCALC 80 11/16/2020  LDLDIRECT 155.0 06/25/2015   TRIG 196 (H) 11/16/2020   CHOLHDL 3.5 11/16/2020   No results found for: VD25OH Lab Results  Component Value Date   WBC 8.7 11/16/2020   HGB 12.2 11/16/2020   HCT 36.8 11/16/2020   MCV 89.8 11/16/2020   PLT 250 11/16/2020   No results found for: IRON, TIBC, FERRITIN  Attestation Statements:   Reviewed by clinician on day of visit: allergies, medications, problem list, medical history, surgical history, family history, social history, and previous encounter notes.  Time spent on visit including pre-visit chart review and  post-visit care and charting was 31 minutes.   I, Lizbeth Bark, RMA, am acting as Location manager for [employer], [place].   I have reviewed the above documentation for accuracy and completeness, and I agree with the above. -  Georgianne Fick, FNP

## 2021-04-11 ENCOUNTER — Ambulatory Visit: Payer: Federal, State, Local not specified - PPO | Attending: Family Medicine | Admitting: Physical Therapy

## 2021-04-11 ENCOUNTER — Encounter: Payer: Self-pay | Admitting: Physical Therapy

## 2021-04-11 DIAGNOSIS — M545 Low back pain, unspecified: Secondary | ICD-10-CM | POA: Diagnosis not present

## 2021-04-11 DIAGNOSIS — R293 Abnormal posture: Secondary | ICD-10-CM | POA: Insufficient documentation

## 2021-04-11 DIAGNOSIS — M25561 Pain in right knee: Secondary | ICD-10-CM | POA: Insufficient documentation

## 2021-04-11 DIAGNOSIS — M25562 Pain in left knee: Secondary | ICD-10-CM | POA: Diagnosis not present

## 2021-04-11 DIAGNOSIS — M62830 Muscle spasm of back: Secondary | ICD-10-CM | POA: Insufficient documentation

## 2021-04-11 DIAGNOSIS — M6281 Muscle weakness (generalized): Secondary | ICD-10-CM | POA: Diagnosis not present

## 2021-04-11 NOTE — Patient Instructions (Signed)
Access Code: [email or web address] URL: [email or web address] Date: 04/11/2021 Prepared by: Annie Paras  Exercises Supine Posterior Pelvic Tilt - 2 x daily - 7 x weekly - 2 sets - 10 reps - 5 sec hold Bent Knee Fallouts with Alternating Legs - 2 x daily - 7 x weekly - 2 sets - 10 reps - 3-5 sec hold Supine Lower Trunk Rotation - 2 x daily - 7 x weekly - 10 reps - 5-10 sec hold Seated Hamstring Stretch with Strap - 2-3 x daily - 7 x weekly - 3 reps - 30 sec hold Supine Hip Adduction Isometric with Ball - 1 x daily - 7 x weekly - 2 sets - 10 reps - 5 sec hold Hooklying Isometric Clamshell - 1 x daily - 7 x weekly - 2 sets - 10 reps - 3-5 sec hold Supine Bridge with Resistance Band - 1 x daily - 7 x weekly - 2 sets - 10 reps - 5 sec hold Standing Hip Flexion with Counter Support - 1 x daily - 7 x weekly - 2 sets - 10 reps - 2-3 sec hold Standing Hip Abduction with Counter Support - 1 x daily - 7 x weekly - 2 sets - 10 reps - 2-3 sec hold Standing Hip Extension with Counter Support - 1 x daily - 7 x weekly - 2 sets - 10 reps - 2-3 sec hold  Patient Education Posture and Body Mechanics Office Posture  Sleeping on Back  Place pillow under knees. A pillow with cervical support and a roll around waist are also helpful. Copyright  VHI. All rights reserved.  Sleeping on Side Place pillow between knees. Use cervical support under neck and a roll around waist as needed. Copyright  VHI. All rights reserved.   Sleeping on Stomach   If this is the only desirable sleeping position, place pillow under lower legs, and under stomach or chest as needed.  Posture - Sitting   Sit upright, head facing forward. Try using a roll to support lower back. Keep shoulders relaxed, and avoid rounded back. Keep hips level with knees. Avoid crossing legs for long periods. Stand to Sit / Sit to Stand   To sit: Bend knees to lower self onto front edge of chair, then scoot back on seat. To stand: Reverse  sequence by placing one foot forward, and scoot to front of seat. Use rocking motion to stand up.   Work Height and Reach  Ideal work height is no more than 2 to 4 inches below elbow level when standing, and at elbow level when sitting. Reaching should be limited to arm's length, with elbows slightly bent.  Bending  Bend at hips and knees, not back. Keep feet shoulder-width apart.    Posture - Standing   Good posture is important. Avoid slouching and forward head thrust. Maintain curve in low back and align ears over shoul- ders, hips over ankles.  Alternating Positions   Alternate tasks and change positions frequently to reduce fatigue and muscle tension. Take rest breaks. Computer Work   Position work to Programmer, multimedia. Use proper work and seat height. Keep shoulders back and down, wrists straight, and elbows at right angles. Use chair that provides full back support. Add footrest and lumbar roll as needed.  Getting Into / Out of Car  Lower self onto seat, scoot back, then bring in one leg at a time. Reverse sequence to get out.  Dressing  Lie on back to pull socks or slacks over  feet, or sit and bend leg while keeping back straight.    Housework - Sink  Place one foot on ledge of cabinet under sink when standing at sink for prolonged periods.   Pushing / Pulling  Pushing is preferable to pulling. Keep back in proper alignment, and use leg muscles to do the work.  Deep Squat   Squat and lift with both arms held against upper trunk. Tighten stomach muscles without holding breath. Use smooth movements to avoid jerking.  Avoid Twisting   Avoid twisting or bending back. Pivot around using foot movements, and bend at knees if needed when reaching for articles.  Carrying Luggage   Distribute weight evenly on both sides. Use a cart whenever possible. Do not twist trunk. Move body as a unit.   Lifting Principles Maintain proper posture and head alignment. Slide  object as close as possible before lifting. Move obstacles out of the way. Test before lifting; ask for help if too heavy. Tighten stomach muscles without holding breath. Use smooth movements; do not jerk. Use legs to do the work, and pivot with feet. Distribute the work load symmetrically and close to the center of trunk. Push instead of pull whenever possible.   Ask For Help   Ask for help and delegate to others when possible. Coordinate your movements when lifting together, and maintain the low back curve.  Log Roll   Lying on back, bend left knee and place left arm across chest. Roll all in one movement to the right. Reverse to roll to the left. Always move as one unit. Housework - Sweeping  Use long-handled equipment to avoid stooping.   Housework - Wiping  Position yourself as close as possible to reach work surface. Avoid straining your back.  Laundry - Unloading Wash   To unload small items at bottom of washer, lift leg opposite to arm being used to reach.  [place] close to area to be raked. Use arm movements to do the work. Keep back straight and avoid twisting.     Cart  When reaching into cart with one arm, lift opposite leg to keep back straight.   Getting Into / Out of Bed  Lower self to lie down on one side by raising legs and lowering head at the same time. Use arms to assist moving without twisting. Bend both knees to roll onto back if desired. To sit up, start from lying on side, and use same move-ments in reverse. Housework - Vacuuming  Hold the vacuum with arm held at side. Step back and forth to move it, keeping head up. Avoid twisting.   Laundry - IT consultant so that bending and twisting can be avoided.   Laundry - Unloading Dryer  Squat down to reach into clothes dryer or use a reacher.  Gardening - Weeding / Probation officer or Kneel. Knee pads may be helpful.

## 2021-04-11 NOTE — Therapy (Signed)
[facility] [address]  [place], [place], [address] Phone: [phone]   Fax:  [phone]  Physical Therapy Treatment / Discharge Summary  Patient Details  Name: Tina Hinton MRN: [id number] Date of Birth: Jun 24, 1955 Referring Provider (PT): Clearance Coots, MD   Encounter Date: 04/11/2021   PT End of Session - 04/11/21 1703     Visit Number 4    Number of Visits 16    Date for PT Re-Evaluation 05/14/21    Authorization Type Federal BC[employer] - VL:50 (PT/OT/ST combined - 0 used)    PT Start Time 1703    PT Stop Time 1751    PT Time Calculation (min) 48 min    Activity Tolerance Patient tolerated treatment well    Behavior During Therapy [facility] for tasks assessed/performed             Past Medical History:  Diagnosis Date   Ankle fracture    Right   Anxiety    Arthritis    hands, knees   Asthma    Bilateral knee pain    Chest pain    Chicken pox    Clavicle fracture    Left   Depression    Foot fracture, left    GERD (gastroesophageal reflux disease)    Hyperlipidemia    IBS (irritable bowel syndrome) 01/19/2017   Joint pain    Low back pain    Osteoarthritis    Other fatigue    Palpitations    Panic attacks    Seasonal allergies    Shortness of breath on exertion    Swallowing difficulty    Uterine polyp    ablation    Past Surgical History:  Procedure Laterality Date   BIOSPY     HEAD   CESAREAN SECTION     COLONOSCOPY     ENDOMETRIAL ABLATION     MANDIBLE SURGERY     TONSILLECTOMY     TUBAL LIGATION     UPPER GASTROINTESTINAL ENDOSCOPY      There were no vitals filed for this visit.   Subjective Assessment - 04/11/21 1707     Subjective Pt reports no pain in her back or knees today. She notes HEP exercises are getting easier. 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 She is requesting to cancel all of her remaining appointments as work is getting very busy at  this time of year. She will request new PT orders from MD after the first of the year if she feels she needs to return.    Pertinent History B knee OA    How long can you stand comfortably? 10-15 minutes    How long can you walk comfortably? ~30 minutes    Diagnostic tests 02/06/21 - lumbar x-ray: Sacralization of L5. Levocurvature of the lumbar spine. Grade 1   anterolisthesis of L4 on L5. Mild multilevel degenerative disc   disease consisting of osteophyte formation and mild disc space   height loss. Moderate facet arthropathy of the lower lumbar spine.   Soft tissues are unremarkable.     IMPRESSION:   No acute osseous abnormality.  03/28/21 - Limited ultrasound: Right knee:  Trace effusion.  Normal-appearing quadricep and patellar tendon.  Mild medial joint space narrowing.  Moderate lateral joint space narrowing   Summary: Osteoarthritic changes of the knee    Patient Stated Goals "to be able to get rid of or decrease the LBP"    Currently  in Pain? No/denies                [facility] PT Assessment - 04/11/21 1703       Assessment   Medical Diagnosis Lumbar strain & B knee OA    Referring Provider (PT) Clearance Coots, MD    Onset Date/Surgical Date 02/06/21    Next MD Visit 04/29/21      Observation/Other Assessments   Focus on Therapeutic Outcomes (FOTO)  Lumbar = 40      Strength   Right Hip Flexion 4+/5    Right Hip Extension 4-/5    Right Hip External Rotation  4/5    Right Hip Internal Rotation 4+/5    Right Hip ABduction 3+/5    Right Hip ADduction 4-/5    Left Hip Flexion 4/5    Left Hip Extension 4-/5    Left Hip External Rotation 4/5    Left Hip Internal Rotation 4/5    Left Hip ABduction 3+/5    Left Hip ADduction 4-/5    Right Knee Flexion 4+/5    Right Knee Extension 4+/5    Left Knee Flexion 4+/5    Left Knee Extension 4+/5    Right Ankle Dorsiflexion 4+/5    Left Ankle Dorsiflexion 4+/5                           [facility] Adult PT  Treatment/Exercise - 04/11/21 1703       Self-Care   Self-Care Posture    Posture Provided education in proper posture and body mechanics for typical daily home and work positioning and activities.      Lumbar Exercises: Aerobic   Recumbent Bike L2 x 6 min                     PT Education - 04/11/21 1710     Education Details Posture and body mechanics education; Review of HEP with guidance on how to progress exercises on her own - Access Code: [email or web address]    Person(s) Educated Patient    Methods Explanation;Demonstration;Verbal cues;Handout    Comprehension Verbalized understanding              PT Short Term Goals - 04/11/21 1731       PT SHORT TERM GOAL #1   Title Patient will be independent with initial HEP    Status Achieved   04/08/21     PT SHORT TERM GOAL #2   Title Patient will verbalize/demonstrate understanding of neutral spine posture and proper body mechanics to reduce strain on lumbar spine    Status Achieved   04/11/21              PT Long Term Goals - 04/11/21 1731       PT LONG TERM GOAL #1   Title Patient will be independent with ongoing/advanced HEP for self-management at home in order to build upon functional gains in therapy    Status Achieved   04/11/21 - met for currrent HEP     PT LONG TERM GOAL #2   Title Patient to demonstrate ability to achieve and maintain good spinal alignment/posturing    Status Unable to assess   04/11/21 - education provided today but unable to assess carryover due to pt choosing to stop PT for now     PT LONG TERM GOAL #3   Title Patient to report reduction in frequency and intensity of LBP by >/=  50-75% to allow for improved activity tolerance    Status Achieved   04/11/21 - Pt reports >/= 50% in pain  - no pain in her back currently and decreasing knee pain     PT LONG TERM GOAL #4   Title Patient will demonstrate improved B proximal LE strength to >/= 4/5 to 4+/5 for improved stability and ease of  mobility    Status Partially Met   04/11/21 - improving strength but continued greatest weakness in B hip extension, abduction and adduction     PT LONG TERM GOAL #5   Title Patient to report ability to perform ADLs, household, and leisure activities without limitation due to LBP, LOM or weakness    Status Partially Met      PT LONG TERM GOAL #6   Title Patient will improve standing and/or walking tolerance to >/= 30 minutes w/o pain interference to allow resumption of normal daily activities    Status Partially Met   04/11/21 - met for walking tolerance but still more limited with static standing     PT LONG TERM GOAL #7   Title Patient will report no further instance/sensation of knee buckling during ambulation    Status Achieved   04/11/21 - no recent instances of knee buckling                  Plan - 04/11/21 1818     Clinical Impression Statement Cecille Rubin arrives to PT today requesting to cancel all of her remaining visits and transition to her HEP as work is getting too busy for her to be able to come to PT through the end of the year. She plans to request new orders from the MD to return to PT in January to see how she is progressing and what else may be necessary at that time. She does feel that PT has been helping, noting it is easier getting in/out tub, starting to do more stairs, and was able to complete a 30+ shopping trip walking through [place] recently w/o any issues. She reports >/= 50% reduction in pain with no back pain currently and notes reduction in her knee pain with no recent knee buckling. Despite only 4 PT visits she was able to meet all of her STGs and has demonstrated good progress with her LTGs, with LTGs #1, 3 and 7 met and the majority of the remaining goals at least partially met. HEP reviewed and guidance provided for continued self-progression at home. Will proceed with transition to HEP and discharge from PT at pt request.    Comorbidities B knee OA, asthma,  IBS, obesity, anxiety and panic disorders, depression, multiple falls, R ankle fracture - lateral malleolus, L navicular fracture    Rehab Potential Good    PT Treatment/Interventions ADLs/Self Care Home Management;Aquatic Therapy;Cryotherapy;Electrical Stimulation;Iontophoresis 72m/ml Dexamethasone;Moist Heat;Traction;Ultrasound;DME Instruction;Gait training;Stair training;Functional mobility training;Therapeutic activities;Therapeutic exercise;Balance training;Neuromuscular re-education;Patient/family education;Manual techniques;Passive range of motion;Dry needling;Taping;Spinal Manipulations;Vasopneumatic Device;Joint Manipulations    PT Next Visit Plan Discharge from PT per pt request    PT Home Exercise Plan Access Code: [email or web address](10/11, updated 10/25 & 11/3)    Consulted and Agree with Plan of Care Patient             Patient will benefit from skilled therapeutic intervention in order to improve the following deficits and impairments:  Cardiopulmonary status limiting activity, Decreased activity tolerance, Decreased balance, Decreased endurance, Decreased mobility, Decreased range of motion, Decreased strength, Difficulty walking, Increased fascial restricitons, Increased muscle spasms,  Impaired perceived functional ability, Impaired flexibility, Improper body mechanics, Postural dysfunction, Obesity, Pain  Visit Diagnosis: Acute midline low back pain without sciatica  Muscle weakness (generalized)  Abnormal posture  Muscle spasm of back  Acute pain of right knee  Acute pain of left knee     Problem List Patient Active Problem List   Diagnosis Date Noted   Class 3 severe obesity with serious comorbidity and body mass index (BMI) of 45.0 to 49.9 in adult ([place]) 03/12/2021   Prediabetes 03/12/2021   Lumbar strain, subsequent encounter 02/14/2021   Asthma    Vertigo 01/31/2020   Aortic atherosclerosis ([place]) 02/15/2018   IBS (irritable bowel syndrome) - D 01/19/2017    Primary osteoarthritis of both knees 01/19/2017   Preventative health care 07/30/2015   Morbid obesity ([place]) 07/30/2015   Anxiety 06/25/2015   Hyperlipidemia LDL goal <100 06/25/2015   Sprain of medial collateral ligament of left knee 06/25/2015   Osteoarthritis of both knees 06/25/2015   Degeneration of intervertebral disc of lumbar region 01/04/2015   Spondylolisthesis at L5-S1 level 01/04/2015   Fracture of lateral malleolus 11/14/2014   Foot, fracture, navicular 11/14/2014     PHYSICAL THERAPY DISCHARGE SUMMARY  Visits from Start of Care: 4  Current functional level related to goals / functional outcomes:   Refer to above clinical impression.   Remaining deficits:   Intermittent B knee pain, continued proximal LE weakness, limited activity and standing tolerance   Education / Equipment:   HEP, posture and body mechanics education   Patient agrees to discharge. Patient goals were partially met. Patient is being discharged due to the patient's request.  Percival Spanish, PT 04/11/2021, 6:33 PM  [facility] [address]  [place], [place], [address] Phone: [phone]   Fax:  [phone]  Name: Shaliah Wann MRN: [id number] Date of Birth: Jan 28, 1956

## 2021-04-15 ENCOUNTER — Ambulatory Visit: Payer: Federal, State, Local not specified - PPO

## 2021-04-16 NOTE — Progress Notes (Signed)
Office: [phone]  /  Fax: [phone]    Date: April 30, 2021   Appointment Start Time: 2:34pm Duration: 26 minutes Hinton: [place]ie [place], Psy.D. Type of Session: Individual Therapy  Location of Patient: Home (private location) Location of Hinton: Provider's Home (private office) Type of Contact: Telepsychological Visit via MyChart Video Visit  Session Content: Tina Hinton is a 65 y.o. female presenting for a follow-up appointment to address the previously established treatment goal of increasing coping skills.Today's appointment was a telepsychological visit due to COVID-19. Tina Hinton provided verbal consent for today's telepsychological appointment and she is aware she is responsible for securing confidentiality on her end of the session. Prior to proceeding with today's appointment, Tina Hinton's physical location at the time of this appointment was obtained as well a phone number she could be reached at in the event of technical difficulties. Tina Hinton participated in today's telepsychological service.   This Hinton conducted a brief check-in. Tina Hinton reported, "I've been doing really well. I lost 17 pounds." She described feeling in control and noted feeling "better physically." Tina Hinton further shared she ceased [employer] use since starting with the clinic. Positive reinforcement was provided. Additionally, Tina Hinton reported she reflected on the previously shared strategies for the holidays to develop a plan. She also discussed a reduction in emotional eating behaviors, but acknowledged sometimes satisfying cravings. To further assist with coping, psychoeducation regarding mindfulness was provided. A handout was provided to Tina Hinton with further information regarding mindfulness, including exercises. This Hinton also explained the benefit of mindfulness as it relates to emotional eating. Tina Hinton was encouraged to engage in the provided exercises between now and the next  appointment with this Hinton. Tina Hinton agreed. During today's appointment, Tina Hinton was led through a mindfulness exercise involving her senses. Tina Hinton provided verbal consent during today's appointment for this Hinton to send a handout about mindfulness via e-mail. Overall, Tina Hinton was receptive to today's appointment as evidenced by openness to sharing, responsiveness to feedback, and willingness to engage in mindfulness exercises to assist with coping.  Mental Status Examination:  Appearance: well groomed and appropriate hygiene  Behavior: appropriate to circumstances Mood: euthymic Affect: mood congruent Speech: normal in rate, volume, and tone Eye Contact: appropriate Psychomotor Activity: appropriate Gait: unable to assess Thought Process: linear, logical, and goal directed  Thought Content/Perception: no hallucinations, delusions, bizarre thinking or behavior reported or observed and no evidence or endorsement of suicidal and homicidal ideation, plan, and intent Orientation: time, person, place, and purpose of appointment Memory/Concentration: memory, attention, language, and fund of knowledge intact  Insight/Judgment: good  Interventions:  Conducted a brief chart review Provided empathic reflections and validation Reviewed content from the previous session Provided positive reinforcement Employed supportive psychotherapy interventions to facilitate reduced distress and to improve coping skills with identified stressors Psychoeducation provided regarding mindfulness Engaged patient in mindfulness exercise(s)  DSM-5 Diagnosis(es):  F50.89 Other Specified Feeding or Eating Disorder, Emotional Eating Behaviors and F43.23 Adjustment Disorder With Mixed Anxiety and Depressed Mood  Treatment Goal & Progress: During the initial appointment with this Hinton, the following treatment goal was established: increase coping skills. Skylah has demonstrated progress in her goal as evidenced  by increased awareness of hunger patterns, increased awareness of triggers for emotional eating behaviors, and reduction in emotional eating behaviors . Geroldine also demonstrates willingness to engage in mindfulness exercises.  Plan: Due to progress to date per Maybelline's self-report and her request, the next appointment will be scheduled in one month, which will be via [place] Visit. The  next session will focus on working towards the established treatment goal.

## 2021-04-18 ENCOUNTER — Encounter: Payer: Federal, State, Local not specified - PPO | Admitting: Physical Therapy

## 2021-04-22 ENCOUNTER — Other Ambulatory Visit (INDEPENDENT_AMBULATORY_CARE_PROVIDER_SITE_OTHER): Payer: Self-pay | Admitting: Family Medicine

## 2021-04-22 DIAGNOSIS — R7303 Prediabetes: Secondary | ICD-10-CM

## 2021-04-24 ENCOUNTER — Ambulatory Visit (INDEPENDENT_AMBULATORY_CARE_PROVIDER_SITE_OTHER): Payer: Federal, State, Local not specified - PPO | Admitting: Family Medicine

## 2021-04-24 ENCOUNTER — Encounter (INDEPENDENT_AMBULATORY_CARE_PROVIDER_SITE_OTHER): Payer: Self-pay | Admitting: Family Medicine

## 2021-04-24 ENCOUNTER — Other Ambulatory Visit: Payer: Self-pay

## 2021-04-24 VITALS — BP 128/82 | HR 77 | Temp 98.1°F | Ht 61.0 in | Wt 241.0 lb

## 2021-04-24 DIAGNOSIS — Z9189 Other specified personal risk factors, not elsewhere classified: Secondary | ICD-10-CM

## 2021-04-24 DIAGNOSIS — Z6841 Body Mass Index (BMI) 40.0 and over, adult: Secondary | ICD-10-CM | POA: Diagnosis not present

## 2021-04-24 DIAGNOSIS — F5089 Other specified eating disorder: Secondary | ICD-10-CM | POA: Diagnosis not present

## 2021-04-24 DIAGNOSIS — R7303 Prediabetes: Secondary | ICD-10-CM

## 2021-04-24 MED ORDER — METFORMIN HCL 500 MG PO TABS: ORAL_TABLET | ORAL | 0 refills | Status: DC

## 2021-04-25 ENCOUNTER — Encounter: Payer: Federal, State, Local not specified - PPO | Admitting: Physical Therapy

## 2021-04-25 NOTE — Progress Notes (Signed)
Chief Complaint:   OBESITY Tina Hinton is here to discuss her progress with her obesity treatment plan along with follow-up of her obesity related diagnoses. Tina Hinton is on the Category 1 Plan and keeping a food journal and adhering to recommended goals of 300-400 calories and 35 grams of protein at supper daily and states she is following her eating plan approximately 85% of the time. Tina Hinton states she is doing 0 minutes 0 times per week.  Today's visit was #: 6 Starting weight: 253 lbs Starting date: 02/12/2021 Today's weight: 241 lbs Today's date: 04/24/2021 Total lbs lost to date: 12 Total lbs lost since last in-office visit: 0  Interim History: [name] has had a little more stress in her life with the time change and her cat keeping her up at night. She has just a couple of days of eating comfort food.  Subjective:   1. Pre-diabetes Tina Hinton has a diagnosis of pre-diabetes based on her elevated HgA1c and was informed this puts her at greater risk of developing diabetes. She notes cravings at night. She denies nausea or hypoglycemia.  2. Other disorder of eating with emotional eating Tina Hinton is struggling with emotional eating and using food for comfort to the extent that it is negatively impacting her health. She has been working with Dr. Mallie Mussel, and she notes it is really helpful. She has been working on behavior modification techniques to help reduce her emotional eating and has been somewhat successful. She shows no sign of suicidal or homicidal ideations.  3. At risk for diabetes mellitus Tina Hinton is at higher than average risk for developing diabetes due to pre-diabetes.   Assessment/Plan:  No orders of the defined types were placed in this encounter.   Medications Discontinued During This Encounter  Medication Reason   metFORMIN (GLUCOPHAGE) 500 MG tablet Reorder     Meds ordered this encounter  Medications   metFORMIN (GLUCOPHAGE) 500 MG tablet    Sig: 1 po qd with  lunch, 1 po q dinner    Dispense:  60 tablet    Refill:  0    30 d supply;  ** OV for RF **   Do not send RF request     1. Pre-diabetes We discussed options to add GLP-1 or double her metformin dose, which she opted for. [name] agreed to increase metformin to 500 mg tablets q daily, and we will refill for 1 month. She will continue to work on weight loss, exercise, and decreasing simple carbohydrates to help decrease the risk of diabetes.   - metFORMIN (GLUCOPHAGE) 500 MG tablet; 1 po qd with lunch, 1 po q dinner  Dispense: 60 tablet; Refill: 0  2. Other disorder of eating with emotional eating Tina Hinton's symptoms are stable. Behavior modification techniques were discussed today to help Tina Hinton deal with her emotional/non-hunger eating behaviors.  Orders and follow up as documented in patient record.   3. At risk for diabetes mellitus - Tina Hinton was given diabetes prevention education and counseling today of more than 10 minutes.  - Counseled patient on pathophysiology of disease and meaning/ implication of lab results.  - Reviewed how certain foods can either stimulate or inhibit insulin release, and subsequently affect hunger pathways  - Importance of following a healthy meal plan with limiting amounts of simple carbohydrates discussed with patient - Effects of regular aerobic exercise on blood sugar regulation reviewed and encouraged an eventual goal of 30 min 5d/week or more as a minimum.  - Briefly discussed treatment  options, which always include dietary and lifestyle modification as first line.   - Handouts provided at patient's desire and/or told to go online to the American Diabetes Association website for further information.  4. Obesity with current BMI of 45.6 Tina Hinton is currently in the action stage of change. As such, her goal is to continue with weight loss efforts. She has agreed to the Category 1 Plan and keeping a food journal and adhering to recommended goals of 300-400  calories and 35 grams of protein at supper daily.   We will recheck fasting blood work at her next office visit; Fasting insulin, A1c, CMP with MAG, FLP, and B12 due to metformin.  Exercise goals: As is.  Behavioral modification strategies: increasing lean protein intake, decreasing simple carbohydrates, and no skipping meals.  Tina Hinton has agreed to follow-up with our clinic in 2 to 3 weeks. She was informed of the importance of frequent follow-up visits to maximize her success with intensive lifestyle modifications for her multiple health conditions.   Objective:   Blood pressure 128/82, pulse 77, temperature 98.1 F (36.7 C), height 5\' 1"  (1.549 m), weight 241 lb (109.3 kg), SpO2 98 %. Body mass index is 45.54 kg/m.  General: Cooperative, alert, well developed, in no acute distress. HEENT: Conjunctivae and lids unremarkable. Cardiovascular: Regular rhythm.  Lungs: Normal work of breathing. Neurologic: No focal deficits.   Lab Results  Component Value Date   CREATININE 0.76 11/16/2020   BUN 18 11/16/2020   NA 142 11/16/2020   K 4.9 11/16/2020   CL 104 11/16/2020   CO2 28 11/16/2020   Lab Results  Component Value Date   ALT 13 11/16/2020   AST 18 11/16/2020   ALKPHOS 77 08/02/2019   BILITOT 0.2 11/16/2020   Lab Results  Component Value Date   HGBA1C 5.9 (H) 02/12/2021   Lab Results  Component Value Date   INSULIN 11.8 02/12/2021   Lab Results  Component Value Date   TSH 1.42 11/16/2020   Lab Results  Component Value Date   CHOL 152 11/16/2020   HDL 43 (L) 11/16/2020   LDLCALC 80 11/16/2020   LDLDIRECT 155.0 06/25/2015   TRIG 196 (H) 11/16/2020   CHOLHDL 3.5 11/16/2020   No results found for: VD25OH Lab Results  Component Value Date   WBC 8.7 11/16/2020   HGB 12.2 11/16/2020   HCT 36.8 11/16/2020   MCV 89.8 11/16/2020   PLT 250 11/16/2020   No results found for: IRON, TIBC, FERRITIN  Attestation Statements:   Reviewed by clinician on day of  visit: allergies, medications, problem list, medical history, surgical history, family history, social history, and previous encounter notes.   Wilhemena Durie, am acting as transcriptionist for [employer], DO.  I have reviewed the above documentation for accuracy and completeness, and I agree with the above.Marjory Sneddon, D.O.  The [place] was signed into law in 2016 which includes the topic of electronic health records.  This provides immediate access to information in MyChart.  This includes consultation notes, operative notes, office notes, lab results and pathology reports.  If you have any questions about what you read please let us know at your next visit so we can discuss your concerns and take corrective action if need be.  We are right here with you.

## 2021-04-29 ENCOUNTER — Ambulatory Visit: Payer: Federal, State, Local not specified - PPO | Admitting: Family Medicine

## 2021-04-29 ENCOUNTER — Encounter: Payer: Federal, State, Local not specified - PPO | Admitting: Physical Therapy

## 2021-04-30 ENCOUNTER — Telehealth (INDEPENDENT_AMBULATORY_CARE_PROVIDER_SITE_OTHER): Payer: Federal, State, Local not specified - PPO | Admitting: Psychology

## 2021-04-30 DIAGNOSIS — F4323 Adjustment disorder with mixed anxiety and depressed mood: Secondary | ICD-10-CM

## 2021-04-30 DIAGNOSIS — F5089 Other specified eating disorder: Secondary | ICD-10-CM

## 2021-05-14 ENCOUNTER — Other Ambulatory Visit (INDEPENDENT_AMBULATORY_CARE_PROVIDER_SITE_OTHER): Payer: Self-pay | Admitting: Family Medicine

## 2021-05-14 ENCOUNTER — Other Ambulatory Visit: Payer: Self-pay

## 2021-05-14 ENCOUNTER — Encounter (INDEPENDENT_AMBULATORY_CARE_PROVIDER_SITE_OTHER): Payer: Self-pay | Admitting: Family Medicine

## 2021-05-14 ENCOUNTER — Ambulatory Visit (INDEPENDENT_AMBULATORY_CARE_PROVIDER_SITE_OTHER): Payer: Federal, State, Local not specified - PPO | Admitting: Family Medicine

## 2021-05-14 VITALS — BP 129/73 | HR 69 | Temp 97.9°F | Ht 61.0 in | Wt 239.0 lb

## 2021-05-14 DIAGNOSIS — F5089 Other specified eating disorder: Secondary | ICD-10-CM | POA: Diagnosis not present

## 2021-05-14 DIAGNOSIS — E7849 Other hyperlipidemia: Secondary | ICD-10-CM | POA: Diagnosis not present

## 2021-05-14 DIAGNOSIS — R5383 Other fatigue: Secondary | ICD-10-CM

## 2021-05-14 DIAGNOSIS — R7303 Prediabetes: Secondary | ICD-10-CM

## 2021-05-14 DIAGNOSIS — E559 Vitamin D deficiency, unspecified: Secondary | ICD-10-CM | POA: Diagnosis not present

## 2021-05-14 DIAGNOSIS — Z6841 Body Mass Index (BMI) 40.0 and over, adult: Secondary | ICD-10-CM

## 2021-05-14 MED ORDER — METFORMIN HCL 500 MG PO TABS: ORAL_TABLET | ORAL | 0 refills | Status: DC

## 2021-05-14 NOTE — Progress Notes (Signed)
  Office: [phone]  /  Fax: [phone]    Date: May 28, 2021   Appointment Start Time: 8:00am Duration: 30 minutes Provider: Glennie Isle, Psy.D. Type of Session: Individual Therapy  Location of Patient: Home (private location) Location of Provider: Provider's Home (private office) Type of Contact: Telepsychological Visit via MyChart Video Visit  Session Content: Tina Hinton is a 64 y.o. female presenting for a follow-up appointment to address the previously established treatment goal of increasing coping skills.Today's appointment was a telepsychological visit due to COVID-19. Tina Hinton provided verbal consent for today's telepsychological appointment and she is aware she is responsible for securing confidentiality on her end of the session. Prior to proceeding with today's appointment, Tina Hinton's physical location at the time of this appointment was obtained as well a phone number she could be reached at in the event of technical difficulties. Tina Hinton and this provider participated in today's telepsychological service.   This provider conducted a brief check-in. Tina Hinton shared, "Everything has been fine." She reported having a plan for her eating during the upcoming holiday, adding she is feeling better. Tina Hinton reflected having groceries and a routine has helped with her journey. Of note, she discussed ongoing worry about her sister's health as well as her own health. Associated thoughts and feelings explored and processed. Psychoeducation provided regarding self-compassion, as it relates to day to day tasks as well as weight loss. Tina Hinton was engaged in a self-compassion exercise to help with eating-related challenges and other ongoing stressors. She was encouraged to regularly ask, "What do I need right now?" Overall, Tina Hinton was receptive to today's appointment as evidenced by openness to sharing, responsiveness to feedback, and  willingness to work toward increasing self-compassion  .  Mental Status Examination:  Appearance: well groomed and appropriate hygiene  Behavior: appropriate to circumstances Mood: sad Affect: mood congruent; tearful when talking about her sister Speech: WNL Eye Contact: appropriate Psychomotor Activity: WNL Gait: unable to assess Thought Process: linear, logical, and goal directed and no evidence or endorsement of suicidal, homicidal, and self-harm ideation, plan and intent  Thought Content/Perception: no hallucinations, delusions, bizarre thinking or behavior endorsed or observed Orientation: AAOx4 Memory/Concentration: memory, attention, language, and fund of knowledge intact  Insight/Judgment: good  Interventions:  Conducted a brief chart review Provided empathic reflections and validation Employed supportive psychotherapy interventions to facilitate reduced distress and to improve coping skills with identified stressors Psychoeducation provided regarding self-compassion Engaged pt in a self-compassion exercise  DSM-5 Diagnosis(es):  F50.89 Other Specified Feeding or Eating Disorder, Emotional Eating Behaviors and F43.23 Adjustment Disorder With Mixed Anxiety and Depressed Mood  Treatment Goal & Progress: During the initial appointment with this provider, the following treatment goal was established: increase coping skills. Tina Hinton has demonstrated progress in her goal as evidenced by increased awareness of hunger patterns, increased awareness of triggers for emotional eating behaviors, and reduction in emotional eating behaviors . Tina Hinton also continues to demonstrate willingness to engage in learned skill(s).  Plan: Based on progress to date per Mysty's self-report, the next appointment will be scheduled in approximately five weeks, which will be via MyChart Video Visit. The next session will focus on working towards the established treatment goal.

## 2021-05-14 NOTE — Progress Notes (Signed)
Chief Complaint:   OBESITY Tina Hinton is here to discuss her progress with her obesity treatment plan along with follow-up of her obesity related diagnoses. Tina Hinton is on the Category 1 Plan and keeping a food journal and adhering to recommended goals of 300-400 calories and 35 grams of protein and states she is following her eating plan approximately 90% of the time. Tina Hinton states she is doing 0 minutes 0 times per week.  Today's visit was #: 7 Starting weight: 253 lbs Starting date: 02/12/2021 Today's weight: 239 lbs Today's date: 05/14/2021 Total lbs lost to date: 14 lbs Total lbs lost since last in-office visit: 2 lbs  Interim History: Dlynn reports she had some treats over Thanksgiving but she had small portions. She feels she is able to manage irregularities in adhering to plan and not feel bad about occasionally eating off plan. She feels the way she is eating can be long-term for her since she likes the foods.  Subjective:   1. Pre-diabetes Tina Hinton's pre-diabetes is well controlled on dose of Metformin 500 mg twice daily. She is tolerating Metformin well- dose was increased to BID at last OV. She denies diarrhea.   Lab Results  Component Value Date   HGBA1C 5.9 (H) 02/12/2021   Lab Results  Component Value Date   INSULIN 11.8 02/12/2021    2. Other hyperlipidemia Tina Hinton has been taking fenofibrate steadily the past 3 months whereas before compliance was sporadic due to size of pill. Her last Triglyceride level was elevated and her HDL was low. She is also on Crestor.  Lab Results  Component Value Date   CHOL 152 11/16/2020   HDL 43 (L) 11/16/2020   LDLCALC 80 11/16/2020   LDLDIRECT 155.0 06/25/2015   TRIG 196 (H) 11/16/2020   CHOLHDL 3.5 11/16/2020   Lab Results  Component Value Date   ALT 13 11/16/2020   AST 18 11/16/2020   ALKPHOS 77 08/02/2019   BILITOT 0.2 11/16/2020   The 10-year ASCVD risk score (Arnett DK, et al., 2019) is: 10.3%   Values used to  calculate the score:     Age: 65 years     Sex: Female     Is Non-Hispanic African American: No     Diabetic: Yes     Tobacco smoker: No     Systolic Blood Pressure: 660 mmHg     Is BP treated: No     HDL Cholesterol: 43 mg/dL     Total Cholesterol: 152 mg/dL   3. Other fatigue Tina Hinton notes fatigue. Her TSH was normal at last check.  4.  Other Specified Feeding or Eating Disorder, Emotional Eating Behaviors  Tina Hinton's mood is stable on Paxil. She is very happy with progress with her emotional eating. She feels it is much better controlled overall. She continues to FU with Dr. Mallie Mussel.  Assessment/Plan:   1. Pre-diabetes We will refill Metformin 500 mg twice daily. We will check labs today.  - metFORMIN (GLUCOPHAGE) 500 MG tablet; 1 po qd with lunch, 1 po q dinner  Dispense: 60 tablet; Refill: 0 - Comprehensive metabolic panel - Hemoglobin A1c - Insulin, random  2. Other hyperlipidemia We will check FLP today. Tina Hinton will continue taking fenofibrate and Crestor. d.  - Lipid Panel With LDL/HDL Ratio  3. Other fatigue Tina Hinton does feel that her weight is causing her energy to be lower than it should be. Fatigue may be related to obesity, depression or many other causes. We will check Vitamin D  today, and in the meanwhile, Tina Hinton will focus on self care including making healthy food choices, increasing physical activity and focusing on stress reduction.   - VITAMIN D 25 Hydroxy (Vit-D Deficiency, Fractures)  4.  Other Specified Feeding or Eating Disorder, Emotional Eating Behaviors   Tina Hinton will continue Paxil. Orders and follow up as documented in patient record.    5. Obesity with current BMI of 45.18 Tina Hinton is currently in the action stage of change. As such, her goal is to continue with weight loss efforts. She has agreed to the Category 1 Plan and keeping a food journal and adhering to recommended goals of 300-400 calories and 35 grams of protein daily.  Exercise  goals:  Tina Hinton was encouraged to ride her recumbent bike for 5 minutes a few days per week.  Behavioral modification strategies: decreasing simple carbohydrates and planning for success.  Tina Hinton has agreed to follow-up with our clinic in 2 weeks.  Objective:   Blood pressure 129/73, pulse 69, temperature 97.9 F (36.6 C), height 5\' 1"  (1.549 m), weight 239 lb (108.4 kg), SpO2 97 %. Body mass index is 45.16 kg/m.  General: Cooperative, alert, well developed, in no acute distress. HEENT: Conjunctivae and lids unremarkable. Cardiovascular: Regular rhythm.  Lungs: Normal work of breathing. Neurologic: No focal deficits.   Lab Results  Component Value Date   CREATININE 0.76 11/16/2020   BUN 18 11/16/2020   NA 142 11/16/2020   K 4.9 11/16/2020   CL 104 11/16/2020   CO2 28 11/16/2020   Lab Results  Component Value Date   ALT 13 11/16/2020   AST 18 11/16/2020   ALKPHOS 77 08/02/2019   BILITOT 0.2 11/16/2020   Lab Results  Component Value Date   HGBA1C 5.9 (H) 02/12/2021   Lab Results  Component Value Date   INSULIN 11.8 02/12/2021   Lab Results  Component Value Date   TSH 1.42 11/16/2020   Lab Results  Component Value Date   CHOL 152 11/16/2020   HDL 43 (L) 11/16/2020   LDLCALC 80 11/16/2020   LDLDIRECT 155.0 06/25/2015   TRIG 196 (H) 11/16/2020   CHOLHDL 3.5 11/16/2020   No results found for: VD25OH Lab Results  Component Value Date   WBC 8.7 11/16/2020   HGB 12.2 11/16/2020   HCT 36.8 11/16/2020   MCV 89.8 11/16/2020   PLT 250 11/16/2020   No results found for: IRON, TIBC, FERRITIN  Attestation Statements:   Reviewed by clinician on day of visit: allergies, medications, problem list, medical history, surgical history, family history, social history, and previous encounter notes.  I, Lizbeth Bark, RMA, am acting as Location manager for [employer].   I have reviewed the above documentation for accuracy and completeness, and I agree with the  above. -  Georgianne Fick, FNP

## 2021-05-15 LAB — LIPID PANEL WITH LDL/HDL RATIO
Cholesterol, Total: 146 mg/dL (ref 100–199)
HDL: 49 mg/dL (ref >39)
LDL Chol Calc (NIH): 82 mg/dL (ref 0–99)
LDL/HDL Ratio: 1.7 ratio (ref 0.0–3.2)
Triglycerides: 77 mg/dL (ref 0–149)
VLDL Cholesterol Cal: 15 mg/dL (ref 5–40)

## 2021-05-15 LAB — HEMOGLOBIN A1C
Est. average glucose Bld gHb Est-mCnc: 123 mg/dL
Hgb A1c MFr Bld: 5.9 % — ABNORMAL HIGH (ref 4.8–5.6)

## 2021-05-15 LAB — COMPREHENSIVE METABOLIC PANEL
ALT: 19 IU/L (ref 0–32)
AST: 22 IU/L (ref 0–40)
Albumin/Globulin Ratio: 2.2 (ref 1.2–2.2)
Albumin: 4.7 g/dL (ref 3.8–4.8)
Alkaline Phosphatase: 76 IU/L (ref 44–121)
BUN/Creatinine Ratio: 28 (ref 12–28)
BUN: 22 mg/dL (ref 8–27)
Bilirubin Total: <0.2 mg/dL (ref 0.0–1.2)
CO2: 25 mmol/L (ref 20–29)
Calcium: 9.5 mg/dL (ref 8.7–10.3)
Chloride: 103 mmol/L (ref 96–106)
Creatinine, Ser: 0.78 mg/dL (ref 0.57–1.00)
Globulin, Total: 2.1 g/dL (ref 1.5–4.5)
Glucose: 99 mg/dL (ref 70–99)
Potassium: 5.1 mmol/L (ref 3.5–5.2)
Sodium: 144 mmol/L (ref 134–144)
Total Protein: 6.8 g/dL (ref 6.0–8.5)
eGFR: 84 mL/min/{1.73_m2} (ref >59)

## 2021-05-15 LAB — INSULIN, RANDOM: INSULIN: 12.5 u[IU]/mL (ref 2.6–24.9)

## 2021-05-15 LAB — VITAMIN D 25 HYDROXY (VIT D DEFICIENCY, FRACTURES): Vit D, 25-Hydroxy: 27.9 ng/mL — ABNORMAL LOW (ref 30.0–100.0)

## 2021-05-20 ENCOUNTER — Other Ambulatory Visit (INDEPENDENT_AMBULATORY_CARE_PROVIDER_SITE_OTHER): Payer: Self-pay | Admitting: Family Medicine

## 2021-05-20 DIAGNOSIS — R7303 Prediabetes: Secondary | ICD-10-CM

## 2021-05-20 NOTE — Telephone Encounter (Signed)
Dawn 

## 2021-05-28 ENCOUNTER — Ambulatory Visit (INDEPENDENT_AMBULATORY_CARE_PROVIDER_SITE_OTHER): Payer: Federal, State, Local not specified - PPO | Admitting: Family Medicine

## 2021-05-28 ENCOUNTER — Encounter (INDEPENDENT_AMBULATORY_CARE_PROVIDER_SITE_OTHER): Payer: Self-pay | Admitting: Family Medicine

## 2021-05-28 ENCOUNTER — Other Ambulatory Visit: Payer: Self-pay

## 2021-05-28 ENCOUNTER — Telehealth (INDEPENDENT_AMBULATORY_CARE_PROVIDER_SITE_OTHER): Payer: Federal, State, Local not specified - PPO | Admitting: Psychology

## 2021-05-28 VITALS — BP 138/83 | HR 72 | Temp 97.3°F | Ht 61.0 in | Wt 238.0 lb

## 2021-05-28 DIAGNOSIS — E7849 Other hyperlipidemia: Secondary | ICD-10-CM | POA: Diagnosis not present

## 2021-05-28 DIAGNOSIS — F5089 Other specified eating disorder: Secondary | ICD-10-CM

## 2021-05-28 DIAGNOSIS — R7303 Prediabetes: Secondary | ICD-10-CM

## 2021-05-28 DIAGNOSIS — F4329 Adjustment disorder with other symptoms: Secondary | ICD-10-CM

## 2021-05-28 DIAGNOSIS — Z6841 Body Mass Index (BMI) 40.0 and over, adult: Secondary | ICD-10-CM

## 2021-05-28 DIAGNOSIS — Z9189 Other specified personal risk factors, not elsewhere classified: Secondary | ICD-10-CM | POA: Diagnosis not present

## 2021-05-28 DIAGNOSIS — F4323 Adjustment disorder with mixed anxiety and depressed mood: Secondary | ICD-10-CM

## 2021-05-28 MED ORDER — METFORMIN HCL 500 MG PO TABS: ORAL_TABLET | ORAL | 0 refills | Status: DC

## 2021-05-28 MED ORDER — FENOFIBRATE 160 MG PO TABS: 160.0000 mg | ORAL_TABLET | Freq: Every day | ORAL | 0 refills | Status: DC

## 2021-05-29 NOTE — Progress Notes (Signed)
Chief Complaint:   OBESITY Tina Hinton is here to discuss her progress with her obesity treatment plan along with follow-up of her obesity related diagnoses. Tina Hinton is on the Category 1 Plan and keeping a food journal and adhering to recommended goals of 300-400 calories and 35 grams protein and states she is following her eating plan approximately 90-95% of the time. Tina Hinton states she is not currently exercising.  Today's visit was #: 8 Starting weight: 253 lbs Starting date: 02/12/2021 Today's weight: 238 lbs Today's date: 05/28/2021 Total lbs lost to date: 15 Total lbs lost since last in-office visit: 1  Interim History: Tina Hinton is here for a follow up office visit.  We reviewed her meal plan and questions were answered.  Patient's food recall appears to be accurate and consistent with what is on plan when she is following it.   When eating on plan, her hunger and cravings are well controlled.  Tina Hinton is getting in four 16 oz bottles of water a day.   Subjective:   1. Pre-diabetes Discussed labs with patient today. Tina Hinton reports Metformin helps her manage her food choices and less snacking.  2. Other hyperlipidemia Discussed labs with patient today. Tina Hinton's triglycerides and LDL are within normal limits and HDL has increased. Medication: Crestor, fenofibrate   3. Adjustment disorder with other symptom with emotional eating Tina Hinton met with Dr. Mallie Mussel this morning and reports it is very helpful. Her mood is controlled with Paxil. Without it, her mood is anxious.  4. At risk for heart disease [name] is at a higher than average risk for cardiovascular disease due to obesity, pre-diabetes, and hyperlipidemia.   Assessment/Plan:  No orders of the defined types were placed in this encounter.   Medications Discontinued During This Encounter  Medication Reason   metFORMIN (GLUCOPHAGE) 500 MG tablet Reorder   fenofibrate 160 MG tablet Reorder     Meds ordered this  encounter  Medications   metFORMIN (GLUCOPHAGE) 500 MG tablet    Sig: 1 po qd with lunch, 1 po q dinner    Dispense:  60 tablet    Refill:  0    30 d supply;  ** OV for RF **   Do not send RF request   fenofibrate 160 MG tablet    Sig: Take 1 tablet (160 mg total) by mouth daily.    Dispense:  30 tablet    Refill:  0     1. Pre-diabetes Tina Hinton will continue to work on weight loss, exercise, and decreasing simple carbohydrates to help decrease the risk of diabetes.   Refill- metFORMIN (GLUCOPHAGE) 500 MG tablet; 1 po qd with lunch, 1 po q dinner  Dispense: 60 tablet; Refill: 0  2. Other hyperlipidemia Cardiovascular risk and specific lipid/LDL goals reviewed.  We discussed several lifestyle modifications today and Tina Hinton will continue to work on diet, exercise and weight loss efforts. Orders and follow up as documented in patient record.   Counseling Intensive lifestyle modifications are the first line treatment for this issue. Dietary changes: Increase soluble fiber. Decrease simple carbohydrates. Exercise changes: Moderate to vigorous-intensity aerobic activity 150 minutes per week if tolerated. Lipid-lowering medications: see documented in medical record.  Refill (per Tina Hinton request)- fenofibrate 160 MG tablet; Take 1 tablet (160 mg total) by mouth daily.  Dispense: 30 tablet; Refill: 0  3. Adjustment disorder with other symptom with emotional eating Symptoms controlled. Continue with Dr. Mallie Mussel for emotional eating counseling and continue meds, per GYN for mood  and hot flashes.  4. At risk for heart disease Due to Tina Hinton's current state of health and medical condition(s), she is at a higher risk for heart disease.  This puts the patient at much greater risk to subsequently develop cardiopulmonary conditions that can significantly affect patient's quality of life in a negative manner.    At least 9 minutes were spent on counseling [name] about these concerns today, and I stressed  the importance of reversing risks factors of obesity, especially truncal and visceral fat, hypertension, hyperlipidemia, and pre-diabetes.  The initial goal is to lose at least 5-10% of starting weight to help reduce these risk factors.  Counseling:  Intensive lifestyle modifications were discussed with Tina Hinton as the most appropriate first line of treatment.  she will continue to work on diet, exercise, and weight loss efforts.  We will continue to reassess these conditions on a fairly regular basis in an attempt to decrease the patient's overall morbidity and mortality.  Evidence-based interventions for health behavior change were utilized today including the discussion of self monitoring techniques, problem-solving barriers, and SMART goal setting techniques.  Specifically, regarding patient's less desirable eating habits and patterns, we employed the technique of small changes when Tina Hinton has not been able to fully commit to her prudent nutritional plan.  5. Obesity with current BMI of 45.1  Toni is currently in the action stage of change. As such, her goal is to continue with weight loss efforts. She has agreed to the Category 1 Plan with protein equivalents and breakfast options and keeping a food journal and adhering to recommended goals of 300-400 calories and 35+ grams protein.   Handout: Holiday Eating Guide Strategies reviewed with Tina Hinton. Tina Hinton already has plan for the holidays.   Exercise goals:  Start walking 10 minutes every other day or biking.  Behavioral modification strategies: holiday eating strategies .  Tina Hinton has agreed to follow-up with our clinic in 3 weeks with FNP Dawn. She was informed of the importance of frequent follow-up visits to maximize her success with intensive lifestyle modifications for her multiple health conditions.   Objective:   Blood pressure 138/83, pulse 72, temperature (!) 97.3 F (36.3 C), height 5\' 1"  (1.549 m), weight 238 lb (108 kg), SpO2 98  %. Body mass index is 44.97 kg/m.  General: Cooperative, alert, well developed, in no acute distress. HEENT: Conjunctivae and lids unremarkable. Cardiovascular: Regular rhythm.  Lungs: Normal work of breathing. Neurologic: No focal deficits.   Lab Results  Component Value Date   CREATININE 0.78 05/14/2021   BUN 22 05/14/2021   NA 144 05/14/2021   K 5.1 05/14/2021   CL 103 05/14/2021   CO2 25 05/14/2021   Lab Results  Component Value Date   ALT 19 05/14/2021   AST 22 05/14/2021   ALKPHOS 76 05/14/2021   BILITOT <0.2 05/14/2021   Lab Results  Component Value Date   HGBA1C 5.9 (H) 05/14/2021   HGBA1C 5.9 (H) 02/12/2021   Lab Results  Component Value Date   INSULIN 12.5 05/14/2021   INSULIN 11.8 02/12/2021   Lab Results  Component Value Date   TSH 1.42 11/16/2020   Lab Results  Component Value Date   CHOL 146 05/14/2021   HDL 49 05/14/2021   LDLCALC 82 05/14/2021   LDLDIRECT 155.0 06/25/2015   TRIG 77 05/14/2021   CHOLHDL 3.5 11/16/2020   Lab Results  Component Value Date   VD25OH 27.9 (L) 05/14/2021   Lab Results  Component Value Date  WBC 8.7 11/16/2020   HGB 12.2 11/16/2020   HCT 36.8 11/16/2020   MCV 89.8 11/16/2020   PLT 250 11/16/2020    Attestation Statements:   Reviewed by clinician on day of visit: allergies, medications, problem list, medical history, surgical history, family history, social history, and previous encounter notes.  Coral Ceo, CMA, am acting as transcriptionist for [employer], DO.  I have reviewed the above documentation for accuracy and completeness, and I agree with the above. Marjory Sneddon, D.O.  The [place] was signed into law in 2016 which includes the topic of electronic health records.  This provides immediate access to information in MyChart.  This includes consultation notes, operative notes, office notes, lab results and pathology reports.  If you have any questions about what you  read please let us know at your next visit so we can discuss your concerns and take corrective action if need be.  We are right here with you.

## 2021-06-17 NOTE — Progress Notes (Signed)
°  Office: [phone]  /  Fax: [phone]    Date: July 01, 2021   Appointment Start Time: 7:58am Duration: 31 minutes Provider: Glennie Isle, Psy.D. Type of Session: Individual Therapy  Location of Patient: Home (private location) Location of Provider: Provider's Home (private office) Type of Contact: Telepsychological Visit via MyChart Video Visit  Session Content: Tina Hinton is a 66 y.o. female presenting for a follow-up appointment to address the previously established treatment goal of increasing coping skills.Today's appointment was a telepsychological visit due to COVID-19. Tina Hinton provided verbal consent for today's telepsychological appointment and she is aware she is responsible for securing confidentiality on her end of the session. Prior to proceeding with today's appointment, Tina Hinton's physical location at the time of this appointment was obtained as well a phone number she could be reached at in the event of technical difficulties. Tina Hinton and this provider participated in today's telepsychological service.   This provider conducted a brief check-in. Tina Hinton shared about the holidays, noting she engaged in portion control and made better choices as it relates to eating habits. She also discussed implementing discussed strategies. Positive reinforcement was provided. This provider and Monna reflected on success/changes to date. She was encouraged to write reminders for herself based on the aforementioned for future reference; she was observed writing. Session focused further on mindfulness to assist with coping. Tina Hinton was led through a mindfulness exercise (A Tastes of Mindfulness) and her experience was processed. Tina Hinton provided verbal consent during today's appointment for this provider to send the handout for today's exercise via e-mail. Overall, Tina Hinton was receptive to today's appointment as evidenced by openness to sharing, responsiveness to feedback, and willingness to  continue engaging in mindfulness exercises.  Mental Status Examination:  Appearance: well groomed and appropriate hygiene  Behavior: appropriate to circumstances Mood: neutral Affect: mood congruent Speech: WNL Eye Contact: appropriate Psychomotor Activity: WNL Gait: unable to assess Thought Process: no evidence or endorsement of suicidal, homicidal, and self-harm ideation, plan and intent  Thought Content/Perception: no hallucinations, delusions, bizarre thinking or behavior endorsed or observed Orientation: AAOx4 Memory/Concentration: memory, attention, language, and fund of knowledge intact  Insight/Judgment: good  Interventions:  Conducted a brief chart review Provided empathic reflections and validation Provided positive reinforcement Employed supportive psychotherapy interventions to facilitate reduced distress and to improve coping skills with identified stressors Engaged patient in mindfulness exercise(s)  DSM-5 Diagnosis(es):  F50.89 Other Specified Feeding or Eating Disorder, Emotional Eating Behaviors and F43.23 Adjustment Disorder With Mixed Anxiety and Depressed Mood  Treatment Goal & Progress: During the initial appointment with this provider, the following treatment goal was established: increase coping skills. Tina Hinton demonstrated progress in her goal as evidenced by increased awareness of hunger patterns, increased awareness of triggers for emotional eating behaviors, and reduction in emotional eating behaviors . Tina Hinton also continues to demonstrate willingness to engage in learned skill(s).  Plan: Tina Hinton declined future appointments with this provider, noting, "I'm where I need to be." She acknowledged understanding that she may request a follow-up appointment with this provider in the future as long as she is still established with the clinic. No further follow-up planned by this provider.

## 2021-06-25 ENCOUNTER — Other Ambulatory Visit: Payer: Self-pay

## 2021-06-25 ENCOUNTER — Ambulatory Visit (INDEPENDENT_AMBULATORY_CARE_PROVIDER_SITE_OTHER): Payer: Federal, State, Local not specified - PPO | Admitting: Family Medicine

## 2021-06-25 ENCOUNTER — Encounter (INDEPENDENT_AMBULATORY_CARE_PROVIDER_SITE_OTHER): Payer: Self-pay | Admitting: Family Medicine

## 2021-06-25 VITALS — BP 126/77 | HR 81 | Temp 98.3°F | Ht 61.0 in | Wt 236.0 lb

## 2021-06-25 DIAGNOSIS — E559 Vitamin D deficiency, unspecified: Secondary | ICD-10-CM | POA: Diagnosis not present

## 2021-06-25 DIAGNOSIS — R7303 Prediabetes: Secondary | ICD-10-CM | POA: Diagnosis not present

## 2021-06-25 DIAGNOSIS — Z6841 Body Mass Index (BMI) 40.0 and over, adult: Secondary | ICD-10-CM

## 2021-06-25 MED ORDER — VITAMIN D (ERGOCALCIFEROL) 1.25 MG (50000 UNIT) PO CAPS: 50000.0000 [IU] | ORAL_CAPSULE | ORAL | 0 refills | Status: DC

## 2021-06-25 NOTE — Progress Notes (Signed)
Chief Complaint:   OBESITY Tina Hinton is here to discuss her progress with her obesity treatment plan along with follow-up of her obesity related diagnoses. Tina Hinton is on the Category 1 Plan and keeping a food journal and adhering to recommended goals of 300-400 calories and 35 grams of protein with breakfast options and states she is following her eating plan approximately 90-95% of the time. Tina Hinton states she is doing 0 minutes 0 times per week.  Today's visit was #: 9 Starting weight: 253 lbs Starting date: 02/12/2021 Today's weight: 236 lbs Today's date: 06/25/2021 Total lbs lost to date: 17 lbs Total lbs lost since last in-office visit: 2 lbs  Interim History: Tina Hinton celebrated a birthday and weathered the holidays and still lost 2 lbs. Hunger is well controlled. She feels that planning for the holidays with Dr. Mallie Mussel was important to her success.  Tina Hinton has been having frozen meals at lunch.  She gets about 3,000 steps per day. She works at home. She says she needs to drink more water.  Subjective:   1. Pre-diabetes Tina Hinton denies polyphagia. She is on Metformin 500 mg 2 times a day. She denies nausea or diarrhea.   Lab Results  Component Value Date   HGBA1C 5.9 (H) 05/14/2021   Lab Results  Component Value Date   INSULIN 12.5 05/14/2021   INSULIN 11.8 02/12/2021    2. Vitamin D deficiency Tina Hinton's Vitamin D is low at 27.9. She is not on supplementation.  Lab Results  Component Value Date   VD25OH 27.9 (L) 05/14/2021    Assessment/Plan:   1. Pre-diabetes Rovena will continue Metformin.   2. Vitamin D deficiency Tina Hinton agrees to start prescription Vitamin D 50,000 IU every week and she will follow-up for routine testing of Vitamin D, at least 2-3 times per year to avoid over-replacement.  - Vitamin D, Ergocalciferol, (DRISDOL) 1.25 MG (50000 UNIT) CAPS capsule; Take 1 capsule (50,000 Units total) by mouth every 7 (seven) days.  Dispense: 12 capsule;  Refill: 0  3. Obesity with current BMI of 44.61 Tina Hinton is currently in the action stage of change. As such, her goal is to continue with weight loss efforts. She has agreed to the Category 1 Plan.    Exercise goals:  Tina Hinton will increase steps to 4,000 per day.   Behavioral modification strategies: increasing water intake and meal planning and cooking strategies. Discussed strategies for increasing water intake.  Tina Hinton has agreed to follow-up with our clinic in 3 weeks with Dr. Raliegh Scarlet.  Objective:   Blood pressure 126/77, pulse 81, temperature 98.3 F (36.8 C), height 5\' 1"  (1.549 m), weight 236 lb (107 kg), SpO2 97 %. Body mass index is 44.59 kg/m.  General: Cooperative, alert, well developed, in no acute distress. HEENT: Conjunctivae and lids unremarkable. Cardiovascular: Regular rhythm.  Lungs: Normal work of breathing. Neurologic: No focal deficits.   Lab Results  Component Value Date   CREATININE 0.78 05/14/2021   BUN 22 05/14/2021   NA 144 05/14/2021   K 5.1 05/14/2021   CL 103 05/14/2021   CO2 25 05/14/2021   Lab Results  Component Value Date   ALT 19 05/14/2021   AST 22 05/14/2021   ALKPHOS 76 05/14/2021   BILITOT <0.2 05/14/2021   Lab Results  Component Value Date   HGBA1C 5.9 (H) 05/14/2021   HGBA1C 5.9 (H) 02/12/2021   Lab Results  Component Value Date   INSULIN 12.5 05/14/2021   INSULIN 11.8 02/12/2021   Lab  Results  Component Value Date   TSH 1.42 11/16/2020   Lab Results  Component Value Date   CHOL 146 05/14/2021   HDL 49 05/14/2021   LDLCALC 82 05/14/2021   LDLDIRECT 155.0 06/25/2015   TRIG 77 05/14/2021   CHOLHDL 3.5 11/16/2020   Lab Results  Component Value Date   VD25OH 27.9 (L) 05/14/2021   Lab Results  Component Value Date   WBC 8.7 11/16/2020   HGB 12.2 11/16/2020   HCT 36.8 11/16/2020   MCV 89.8 11/16/2020   PLT 250 11/16/2020   No results found for: IRON, TIBC, FERRITIN  Attestation Statements:   Reviewed by  clinician on day of visit: allergies, medications, problem list, medical history, surgical history, family history, social history, and previous encounter notes.  I, Lizbeth Bark, RMA, am acting as Location manager for [employer], [place].  I have reviewed the above documentation for accuracy and completeness, and I agree with the above. -  Georgianne Fick, FNP

## 2021-06-27 ENCOUNTER — Other Ambulatory Visit (INDEPENDENT_AMBULATORY_CARE_PROVIDER_SITE_OTHER): Payer: Self-pay | Admitting: Family Medicine

## 2021-06-27 DIAGNOSIS — R7303 Prediabetes: Secondary | ICD-10-CM

## 2021-06-27 DIAGNOSIS — E7849 Other hyperlipidemia: Secondary | ICD-10-CM

## 2021-06-27 NOTE — Telephone Encounter (Signed)
Pt last seen by Dawn Whitmire, FNP.  

## 2021-07-01 ENCOUNTER — Other Ambulatory Visit: Payer: Self-pay

## 2021-07-01 ENCOUNTER — Telehealth (INDEPENDENT_AMBULATORY_CARE_PROVIDER_SITE_OTHER): Payer: Federal, State, Local not specified - PPO | Admitting: Psychology

## 2021-07-01 ENCOUNTER — Other Ambulatory Visit: Payer: Self-pay | Admitting: Family Medicine

## 2021-07-01 DIAGNOSIS — N951 Menopausal and female climacteric states: Secondary | ICD-10-CM

## 2021-07-01 DIAGNOSIS — F5089 Other specified eating disorder: Secondary | ICD-10-CM

## 2021-07-01 DIAGNOSIS — F4323 Adjustment disorder with mixed anxiety and depressed mood: Secondary | ICD-10-CM | POA: Diagnosis not present

## 2021-07-01 DIAGNOSIS — E785 Hyperlipidemia, unspecified: Secondary | ICD-10-CM

## 2021-07-01 MED ORDER — PAROXETINE HCL 10 MG PO TABS: 10.0000 mg | ORAL_TABLET | Freq: Every day | ORAL | 0 refills | Status: DC

## 2021-07-01 NOTE — Progress Notes (Signed)
Patient given one refill of Paxil until patient has appointment at end of Feb with Dr. Ihor Dow. Kathrene Alu RN

## 2021-07-03 DIAGNOSIS — H903 Sensorineural hearing loss, bilateral: Secondary | ICD-10-CM | POA: Diagnosis not present

## 2021-07-16 ENCOUNTER — Encounter (INDEPENDENT_AMBULATORY_CARE_PROVIDER_SITE_OTHER): Payer: Self-pay | Admitting: Family Medicine

## 2021-07-16 ENCOUNTER — Other Ambulatory Visit: Payer: Self-pay

## 2021-07-16 ENCOUNTER — Ambulatory Visit (INDEPENDENT_AMBULATORY_CARE_PROVIDER_SITE_OTHER): Payer: Federal, State, Local not specified - PPO | Admitting: Family Medicine

## 2021-07-16 VITALS — BP 105/72 | HR 88 | Temp 98.1°F | Ht 61.0 in | Wt 235.0 lb

## 2021-07-16 DIAGNOSIS — Z6841 Body Mass Index (BMI) 40.0 and over, adult: Secondary | ICD-10-CM

## 2021-07-16 DIAGNOSIS — E669 Obesity, unspecified: Secondary | ICD-10-CM | POA: Diagnosis not present

## 2021-07-16 DIAGNOSIS — Z9189 Other specified personal risk factors, not elsewhere classified: Secondary | ICD-10-CM

## 2021-07-16 DIAGNOSIS — R7303 Prediabetes: Secondary | ICD-10-CM | POA: Diagnosis not present

## 2021-07-16 DIAGNOSIS — F419 Anxiety disorder, unspecified: Secondary | ICD-10-CM | POA: Diagnosis not present

## 2021-07-16 DIAGNOSIS — G4700 Insomnia, unspecified: Secondary | ICD-10-CM

## 2021-07-16 DIAGNOSIS — E559 Vitamin D deficiency, unspecified: Secondary | ICD-10-CM

## 2021-07-16 DIAGNOSIS — E7849 Other hyperlipidemia: Secondary | ICD-10-CM

## 2021-07-16 MED ORDER — TRAZODONE HCL 50 MG PO TABS: ORAL_TABLET | ORAL | 0 refills | Status: DC

## 2021-07-16 MED ORDER — METFORMIN HCL 500 MG PO TABS: ORAL_TABLET | ORAL | 0 refills | Status: DC

## 2021-07-21 NOTE — Progress Notes (Signed)
Chief Complaint:   OBESITY Tina Hinton is here to discuss her progress with her obesity treatment plan along with follow-up of her obesity related diagnoses. Tina Hinton is on the Category 1 Plan and states she is following her eating plan approximately 90% of the time. Tina Hinton states she is doing yard work for 20-30 minutes 3 times per week.  Today's visit was #: 10 Starting weight: 253 lbs Starting date: 02/12/2021 Today's weight: 235 lbs Today's date: 07/16/2021 Total lbs lost to date: 18 Total lbs lost since last in-office visit: 1  Interim History: Tina Hinton denies issues with the plan. She received bad news about a family member 3 days ago and she has been really upset; not making the best choices lately.  Subjective:   1. Anxiety Tina Hinton has a new diagnosis of anxiety. She found out that her sister has a malignant tumor of the fallopian tube and she has to go to the [place] to take care of her. She doubled her Paxil on her own last week. She is tearful, stressed, but stable.  2. Insomnia, unspecified type Tina Hinton has a new diagnosis of insomnia. She cannot sleep well, and she is not functioning well without sleep, and she cannot think straight. She thinks she needs something to help her sleep at this point. She notes difficulty falling and staying asleep.  3. Pre-diabetes Tina Hinton is tolerating medication(s) well without side effects.  Medication compliance is good and patient appears to be taking it as prescribed.  The patient denies additional concerns regarding this condition.   4. At risk for depression Ceclia is at elevated risk of depression due to one or more of the following: family history, significant life stressors, medical conditions and/or poor nutrition.  Assessment/Plan:  No orders of the defined types were placed in this encounter.   Medications Discontinued During This Encounter  Medication Reason   metFORMIN (GLUCOPHAGE) 500 MG tablet Reorder     Meds  ordered this encounter  Medications   metFORMIN (GLUCOPHAGE) 500 MG tablet    Sig: 1 po qd with lunch, 1 po q dinner    Dispense:  60 tablet    Refill:  0    30 d supply;  ** OV for RF **   Do not send RF request   traZODone (DESYREL) 50 MG tablet    Sig: 1-2 q hs    Dispense:  60 tablet    Refill:  0     1. Anxiety We discussed self-care activities, stress management with walking, exercise, and meditation today to help Travis deal with her anxiety. Orders and follow up as documented in patient record.   2. Insomnia, unspecified type The problem of recurrent insomnia was discussed. Orders and follow up as documented in patient record. Counseling: Intensive lifestyle modifications are the first line treatment for this issue. We discussed several lifestyle modifications today. Tina Hinton agreed to start trazodone 50 mg qhs with no refills. Risks and benefits of the medication was discussed with the patient, as well as sleep hygiene. She will continue to work on diet, exercise and weight loss efforts.   Counseling Limit or avoid alcohol, caffeinated beverages, and cigarettes, especially close to bedtime.  Do not eat a large meal or eat spicy foods right before bedtime. This can lead to digestive discomfort that can make it hard for you to sleep. Keep a sleep diary to help you and your health care provider figure out what could be causing your insomnia.  Make your bedroom  a dark, comfortable place where it is easy to fall asleep. Put up shades or blackout curtains to block light from outside. Use a white noise machine to block noise. Keep the temperature cool. Limit screen use before bedtime. This includes: Watching TV. Using your smartphone, tablet, or computer. Stick to a routine that includes going to bed and waking up at the same times every day and night. This can help you fall asleep faster. Consider making a quiet activity, such as reading, part of your nighttime routine. Try to avoid  taking naps during the day so that you sleep better at night. Get out of bed if you are still awake after 15 minutes of trying to sleep. Keep the lights down, but try reading or doing a quiet activity. When you feel sleepy, go back to bed.  - traZODone (DESYREL) 50 MG tablet; 1-2 q hs  Dispense: 60 tablet; Refill: 0  3. Pre-diabetes Tina Hinton will continue to work on weight loss, exercise, and decreasing simple carbohydrates to help decrease the risk of diabetes. We will refill metformin for 1 month.  - metFORMIN (GLUCOPHAGE) 500 MG tablet; 1 po qd with lunch, 1 po q dinner  Dispense: 60 tablet; Refill: 0  4. At risk for depression Tina Hinton was given approximately 9 minutes of depression risk counseling today. She has risk factors for depression including stress. We discussed the importance of a healthy work life balance, a healthy relationship with food and a good support system.  Repetitive spaced learning was employed today to elicit superior memory formation and behavioral change.  5. Obesity with current BMI of 44.4 Tina Hinton is currently in the action stage of change. As such, her goal is to continue with weight loss efforts. She has agreed to the Category 1 Plan.   Exercise goals: For substantial health benefits, adults should do at least 150 minutes (2 hours and 30 minutes) a week of moderate-intensity, or 75 minutes (1 hour and 15 minutes) a week of vigorous-intensity aerobic physical activity, or an equivalent combination of moderate- and vigorous-intensity aerobic activity. Aerobic activity should be performed in episodes of at least 10 minutes, and preferably, it should be spread throughout the week.  Behavioral modification strategies: keeping healthy foods in the home.  Tina Hinton has agreed to follow-up with our clinic in 5 to 6 weeks. She was informed of the importance of frequent follow-up visits to maximize her success with intensive lifestyle modifications for her multiple health  conditions.   Objective:   Blood pressure 105/72, pulse 88, temperature 98.1 F (36.7 C), height 5\' 1"  (1.549 m), weight 235 lb (106.6 kg), SpO2 96 %. Body mass index is 44.4 kg/m.  General: Cooperative, alert, well developed, in no acute distress. HEENT: Conjunctivae and lids unremarkable. Cardiovascular: Regular rhythm.  Lungs: Normal work of breathing. Neurologic: No focal deficits.   Lab Results  Component Value Date   CREATININE 0.78 05/14/2021   BUN 22 05/14/2021   NA 144 05/14/2021   K 5.1 05/14/2021   CL 103 05/14/2021   CO2 25 05/14/2021   Lab Results  Component Value Date   ALT 19 05/14/2021   AST 22 05/14/2021   ALKPHOS 76 05/14/2021   BILITOT <0.2 05/14/2021   Lab Results  Component Value Date   HGBA1C 5.9 (H) 05/14/2021   HGBA1C 5.9 (H) 02/12/2021   Lab Results  Component Value Date   INSULIN 12.5 05/14/2021   INSULIN 11.8 02/12/2021   Lab Results  Component Value Date   TSH  1.42 11/16/2020   Lab Results  Component Value Date   CHOL 146 05/14/2021   HDL 49 05/14/2021   LDLCALC 82 05/14/2021   LDLDIRECT 155.0 06/25/2015   TRIG 77 05/14/2021   CHOLHDL 3.5 11/16/2020   Lab Results  Component Value Date   VD25OH 27.9 (L) 05/14/2021   Lab Results  Component Value Date   WBC 8.7 11/16/2020   HGB 12.2 11/16/2020   HCT 36.8 11/16/2020   MCV 89.8 11/16/2020   PLT 250 11/16/2020   No results found for: IRON, TIBC, FERRITIN  Attestation Statements:   Reviewed by clinician on day of visit: allergies, medications, problem list, medical history, surgical history, family history, social history, and previous encounter notes.   Wilhemena Durie, am acting as transcriptionist for [employer], DO.  I have reviewed the above documentation for accuracy and completeness, and I agree with the above. Marjory Sneddon, D.O.  The [place] was signed into law in 2016 which includes the topic of electronic health records.  This  provides immediate access to information in MyChart.  This includes consultation notes, operative notes, office notes, lab results and pathology reports.  If you have any questions about what you read please let us know at your next visit so we can discuss your concerns and take corrective action if need be.  We are right here with you.

## 2021-07-31 ENCOUNTER — Ambulatory Visit: Payer: Federal, State, Local not specified - PPO | Admitting: Obstetrics & Gynecology

## 2021-08-08 ENCOUNTER — Other Ambulatory Visit (INDEPENDENT_AMBULATORY_CARE_PROVIDER_SITE_OTHER): Payer: Self-pay | Admitting: Family Medicine

## 2021-08-08 DIAGNOSIS — R7303 Prediabetes: Secondary | ICD-10-CM

## 2021-08-08 DIAGNOSIS — G4700 Insomnia, unspecified: Secondary | ICD-10-CM

## 2021-08-08 NOTE — Telephone Encounter (Signed)
Dr.Opalski ?

## 2021-08-27 ENCOUNTER — Ambulatory Visit (INDEPENDENT_AMBULATORY_CARE_PROVIDER_SITE_OTHER): Payer: Federal, State, Local not specified - PPO | Admitting: Family Medicine

## 2021-08-27 ENCOUNTER — Encounter (INDEPENDENT_AMBULATORY_CARE_PROVIDER_SITE_OTHER): Payer: Self-pay | Admitting: Family Medicine

## 2021-08-27 ENCOUNTER — Other Ambulatory Visit: Payer: Self-pay

## 2021-08-27 VITALS — BP 116/82 | HR 71 | Temp 97.7°F | Ht 61.0 in | Wt 232.0 lb

## 2021-08-27 DIAGNOSIS — R7303 Prediabetes: Secondary | ICD-10-CM | POA: Diagnosis not present

## 2021-08-27 DIAGNOSIS — E7849 Other hyperlipidemia: Secondary | ICD-10-CM | POA: Diagnosis not present

## 2021-08-27 DIAGNOSIS — G4700 Insomnia, unspecified: Secondary | ICD-10-CM | POA: Diagnosis not present

## 2021-08-27 DIAGNOSIS — E559 Vitamin D deficiency, unspecified: Secondary | ICD-10-CM | POA: Diagnosis not present

## 2021-08-27 DIAGNOSIS — E669 Obesity, unspecified: Secondary | ICD-10-CM

## 2021-08-27 DIAGNOSIS — Z9189 Other specified personal risk factors, not elsewhere classified: Secondary | ICD-10-CM | POA: Diagnosis not present

## 2021-08-27 DIAGNOSIS — Z6841 Body Mass Index (BMI) 40.0 and over, adult: Secondary | ICD-10-CM

## 2021-08-27 MED ORDER — FENOFIBRATE 160 MG PO TABS: 160.0000 mg | ORAL_TABLET | Freq: Every day | ORAL | 0 refills | Status: DC

## 2021-08-27 MED ORDER — METFORMIN HCL 500 MG PO TABS: ORAL_TABLET | ORAL | 0 refills | Status: DC

## 2021-09-02 NOTE — Progress Notes (Signed)
? ? ? ?Chief Complaint:  ? ?OBESITY ?Tina Hinton is here to discuss her progress with her obesity treatment plan along with follow-up of her obesity related diagnoses. Tina Hinton is on the Category 1 Plan and states she is following her eating plan approximately 85% of the time. Tina Hinton states she is not currently exercising. ? ?Today's visit was #: 53 ?Starting weight: 253 lbs ?Starting date: 02/12/2021 ?Today's weight: 232 lbs ?Today's date: 08/27/2021 ?Total lbs lost to date: 21 ?Total lbs lost since last in-office visit: 3 ? ?Interim History: Tina Hinton came back from [place] (there for 2 weeks), where she was taking care of her sister with newly diagnosis of cancer and having chemo. She sis bike for 30 minutes 2 times daily while in [place]. She hasn't done much since she has been back. She is feeling overwhelmed at this time.  ? ?Subjective:  ? ?1. Other hyperlipidemia ?Somara is on Crestor and fenofibrate, and she is tolerating medication(s) well without side effects.  Medication compliance is good and patient appears to be taking it as prescribed.  The patient denies additional concerns regarding this condition.  ? ?2. Pre-diabetes ?Tina Hinton is on metformin, and she is tolerating medication(s) well without side effects. No hunger or cravings. Medication compliance is good and patient appears to be taking it as prescribed.  The patient denies additional concerns regarding this condition.  ? ?3. Insomnia, unspecified type ?Last office visit we started trazodone to help with sleep. Tina Hinton only toook 1/2 tablet 3-4 times. Tolerating it well and it works well. ? ?4. Vitamin D deficiency ?Tina Hinton is currently taking prescription vitamin D 50,000 IU each week. She denies nausea, vomiting or muscle weakness. ? ?5. At risk for depression ?Tina Hinton is at elevated risk of depression due to her sister's recent diagnosis of cancer. ? ?Assessment/Plan:  ? ?Orders Placed This Encounter  ?Procedures  ? Hemoglobin A1c  ? Insulin, random   ? VITAMIN D 25 Hydroxy (Vit-D Deficiency, Fractures)  ? ? ?Medications Discontinued During This Encounter  ?Medication Reason  ? fenofibrate 160 MG tablet Reorder  ? metFORMIN (GLUCOPHAGE) 500 MG tablet Reorder  ?  ? ?Meds ordered this encounter  ?Medications  ? fenofibrate 160 MG tablet  ?  Sig: Take 1 tablet (160 mg total) by mouth daily.  ?  Dispense:  90 tablet  ?  Refill:  0  ? metFORMIN (GLUCOPHAGE) 500 MG tablet  ?  Sig: 1 po qd with lunch, 1 po q dinner  ?  Dispense:  60 tablet  ?  Refill:  0  ?  30 d supply;  ** OV for RF **   Do not send RF request  ?  ? ?1. Other hyperlipidemia ?Tina Hinton requests a refill of fenofibrate, and we will refill for 90 days. ? ?- fenofibrate 160 MG tablet; Take 1 tablet (160 mg total) by mouth daily.  Dispense: 90 tablet; Refill: 0 ? ?2. Pre-diabetes ?We will recheck labs at [facility] next office visit. We will refill metformin for 1 month. ? ?- metFORMIN (GLUCOPHAGE) 500 MG tablet; 1 po qd with lunch, 1 po q dinner  Dispense: 60 tablet; Refill: 0 ?- Hemoglobin A1c ?- Insulin, random ? ?3. Insomnia, unspecified type ?Tina Hinton will continue trazodone as needed only. Sleep hygiene was discussed with the patient. Symptoms are stable currently. ? ?4. Vitamin D deficiency ?Tina Hinton will continue Ergo, and we will recheck her Vit D level at her next office visit. ? ?- VITAMIN D 25 Hydroxy (Vit-D Deficiency, Fractures) ? ?5.  At risk for depression ?Tina Hinton was given approximately 9 minutes of depression risk counseling today. She has risk factors for depression including stress. We discussed the importance of a healthy work life balance, a healthy relationship with food and a good support system. ? ?Repetitive spaced learning was employed today to elicit superior memory formation and behavioral change. ? ?6. Obesity with current BMI of 44 ?Tina Hinton is currently in the action stage of change. As such, her goal is to continue with weight loss efforts. She has agreed to the Category 1 Plan.   ? ?We will recheck fasting labs at her next office visit. ? ?Exercise goals: All adults should avoid inactivity. Some physical activity is better than none, and adults who participate in any amount of physical activity gain some health benefits. To help with stressors.  ? ?Behavioral modification strategies: increasing water intake, keeping healthy foods in the home, ways to avoid boredom eating, emotional eating strategies, and planning for success. ? ?Tina Hinton has agreed to follow-up with our clinic in 3 weeks. She was informed of the importance of frequent follow-up visits to maximize her success with intensive lifestyle modifications for her multiple health conditions.  ? ?Objective:  ? ?Blood pressure 116/82, pulse 71, temperature 97.7 ?F (36.5 ?C), height '5\' 1"'$  (1.549 m), weight 232 lb (105.2 kg), SpO2 98 %. ?Body mass index is 43.84 kg/m?. ? ?General: Cooperative, alert, well developed, in no acute distress. ?HEENT: Conjunctivae and lids unremarkable. ?Cardiovascular: Regular rhythm.  ?Lungs: Normal work of breathing. ?Neurologic: No focal deficits.  ? ?Lab Results  ?Component Value Date  ? CREATININE 0.78 05/14/2021  ? BUN 22 05/14/2021  ? NA 144 05/14/2021  ? K 5.1 05/14/2021  ? CL 103 05/14/2021  ? CO2 25 05/14/2021  ? ?Lab Results  ?Component Value Date  ? ALT 19 05/14/2021  ? AST 22 05/14/2021  ? ALKPHOS 76 05/14/2021  ? BILITOT <0.2 05/14/2021  ? ?Lab Results  ?Component Value Date  ? HGBA1C 5.9 (H) 05/14/2021  ? HGBA1C 5.9 (H) 02/12/2021  ? ?Lab Results  ?Component Value Date  ? INSULIN 12.5 05/14/2021  ? INSULIN 11.8 02/12/2021  ? ?Lab Results  ?Component Value Date  ? TSH 1.42 11/16/2020  ? ?Lab Results  ?Component Value Date  ? CHOL 146 05/14/2021  ? HDL 49 05/14/2021  ? [place] 82 05/14/2021  ? LDLDIRECT 155.0 06/25/2015  ? TRIG 77 05/14/2021  ? CHOLHDL 3.5 11/16/2020  ? ?Lab Results  ?Component Value Date  ? VD25OH 27.9 (L) 05/14/2021  ? ?Lab Results  ?Component Value Date  ? WBC 8.7 11/16/2020  ?  HGB 12.2 11/16/2020  ? HCT 36.8 11/16/2020  ? MCV 89.8 11/16/2020  ? PLT 250 11/16/2020  ? ?No results found for: IRON, TIBC, FERRITIN ? ?Attestation Statements:  ? ?Reviewed by clinician on day of visit: allergies, medications, problem list, medical history, surgical history, family history, social history, and previous encounter notes. ? ? ?I, Trixie Dredge, am acting as transcriptionist for [employer], DO. ? ?I have reviewed the above documentation for accuracy and completeness, and I agree with the above. Marjory Sneddon, D.O. ? ?The [place] was signed into law in 2016 which includes the topic of electronic health records.  This provides immediate access to information in MyChart.  This includes consultation notes, operative notes, office notes, lab results and pathology reports.  If you have any questions about what you read please let us know at your next visit  so we can discuss your concerns and take corrective action if need be.  We are right here with you. ? ? ?

## 2021-09-16 ENCOUNTER — Other Ambulatory Visit: Payer: Self-pay | Admitting: *Deleted

## 2021-09-16 DIAGNOSIS — E785 Hyperlipidemia, unspecified: Secondary | ICD-10-CM

## 2021-09-16 MED ORDER — ROSUVASTATIN CALCIUM 20 MG PO TABS: ORAL_TABLET | ORAL | 1 refills | Status: DC

## 2021-09-17 ENCOUNTER — Ambulatory Visit (INDEPENDENT_AMBULATORY_CARE_PROVIDER_SITE_OTHER): Payer: Federal, State, Local not specified - PPO | Admitting: Family Medicine

## 2021-09-17 ENCOUNTER — Encounter (INDEPENDENT_AMBULATORY_CARE_PROVIDER_SITE_OTHER): Payer: Self-pay | Admitting: Family Medicine

## 2021-09-17 VITALS — BP 96/62 | HR 78 | Temp 97.9°F | Ht 61.0 in | Wt 227.0 lb

## 2021-09-17 DIAGNOSIS — E559 Vitamin D deficiency, unspecified: Secondary | ICD-10-CM

## 2021-09-17 DIAGNOSIS — Z6841 Body Mass Index (BMI) 40.0 and over, adult: Secondary | ICD-10-CM

## 2021-09-17 DIAGNOSIS — Z9189 Other specified personal risk factors, not elsewhere classified: Secondary | ICD-10-CM

## 2021-09-17 DIAGNOSIS — E669 Obesity, unspecified: Secondary | ICD-10-CM

## 2021-09-17 DIAGNOSIS — R7303 Prediabetes: Secondary | ICD-10-CM | POA: Diagnosis not present

## 2021-09-17 MED ORDER — METFORMIN HCL 500 MG PO TABS: ORAL_TABLET | ORAL | 0 refills | Status: DC

## 2021-09-17 MED ORDER — VITAMIN D (ERGOCALCIFEROL) 1.25 MG (50000 UNIT) PO CAPS: 50000.0000 [IU] | ORAL_CAPSULE | ORAL | 0 refills | Status: DC

## 2021-09-18 LAB — INSULIN, RANDOM: INSULIN: 11.6 u[IU]/mL (ref 2.6–24.9)

## 2021-09-18 LAB — VITAMIN D 25 HYDROXY (VIT D DEFICIENCY, FRACTURES): Vit D, 25-Hydroxy: 64.9 ng/mL (ref 30.0–100.0)

## 2021-09-18 LAB — HEMOGLOBIN A1C
Est. average glucose Bld gHb Est-mCnc: 123 mg/dL
Hgb A1c MFr Bld: 5.9 % — ABNORMAL HIGH (ref 4.8–5.6)

## 2021-09-20 ENCOUNTER — Other Ambulatory Visit (INDEPENDENT_AMBULATORY_CARE_PROVIDER_SITE_OTHER): Payer: Self-pay | Admitting: Family Medicine

## 2021-09-20 DIAGNOSIS — R7303 Prediabetes: Secondary | ICD-10-CM

## 2021-09-20 NOTE — Progress Notes (Signed)
? ? ? ?Chief Complaint:  ? ?OBESITY ?Tina Hinton is here to discuss her progress with her obesity treatment plan along with follow-up of her obesity related diagnoses. Tina Hinton is on the Category 1 Plan and states she is following her eating plan approximately 90% of the time. Tina Hinton states she is walking 3,000-4,000 steps 7 times per week.   ? ?Today's visit was #: 12 ?Starting weight: 253 lbs ?Starting date: 02/12/2021 ?Today's weight: 227 lbs ?Today's date: 09/17/2021 ?Total lbs lost to date: 22 ?Total lbs lost since last in-office visit: 5 ? ?Interim History: Tina Hinton has a lot of stress with her sister who is ill. She has been out more in the yard etc, and she is feeling better emotionally with it being Spring time. She is leaving for [place] May 6th to May 20th, and she wont be back until then. She requests an appointment in late May.  ? ?Subjective:  ? ?1. Pre-diabetes ?Tina Hinton has a diagnosis of pre-diabetes based on her elevated HgA1c and was informed this puts her at greater risk of developing diabetes. She denies carb cravings or hunger. She continues to work on diet and exercise to decrease her risk of diabetes. She denies nausea or hypoglycemia. ? ?2. Vitamin D deficiency ?Tina Hinton is currently taking prescription vitamin D 50,000 IU each week. She denies nausea, vomiting or muscle weakness. ? ?3. At risk for depression ?Tina Hinton is at elevated risk of depression due to stressors with her sister's illness.  ? ?Assessment/Plan:  ?No orders of the defined types were placed in this encounter. ? ? ?Medications Discontinued During This Encounter  ?Medication Reason  ? Vitamin D, Ergocalciferol, (DRISDOL) 1.25 MG (50000 UNIT) CAPS capsule Reorder  ? metFORMIN (GLUCOPHAGE) 500 MG tablet Reorder  ?  ? ?Meds ordered this encounter  ?Medications  ? Vitamin D, Ergocalciferol, (DRISDOL) 1.25 MG (50000 UNIT) CAPS capsule  ?  Sig: Take 1 capsule (50,000 Units total) by mouth every 7 (seven) days.  ?  Dispense:  12 capsule  ?   Refill:  0  ? metFORMIN (GLUCOPHAGE) 500 MG tablet  ?  Sig: 1 po qd with lunch, 1 po q dinner  ?  Dispense:  60 tablet  ?  Refill:  0  ?  30 d supply;  ** OV for RF **   Do not send RF request  ?  ? ?1. Pre-diabetes ?We will refill metformin for 1 month. Tina Hinton will continue to work on weight loss, exercise, and decreasing simple carbohydrates to help decrease the risk of diabetes.  ? ?- metFORMIN (GLUCOPHAGE) 500 MG tablet; 1 po qd with lunch, 1 po q dinner  Dispense: 60 tablet; Refill: 0 ? ?2. Vitamin D deficiency ?We will refill prescription Vitamin D for 90 days, with no refills. Tina Hinton will follow-up for routine testing of Vitamin D, at least 2-3 times per year to avoid over-replacement. ? ?- Vitamin D, Ergocalciferol, (DRISDOL) 1.25 MG (50000 UNIT) CAPS capsule; Take 1 capsule (50,000 Units total) by mouth every 7 (seven) days.  Dispense: 12 capsule; Refill: 0 ? ?3. At risk for depression ?Tina Hinton was given approximately 9 minutes of depression risk counseling today. She has risk factors for depression including stress. We discussed the importance of a healthy work life balance, a healthy relationship with food and a good support system. ? ?Repetitive spaced learning was employed today to elicit superior memory formation and behavioral change. ? ?4. Obesity, Current BMI 43.0 ?Tina Hinton is currently in the action stage of change. As such,  her goal is to continue with weight loss efforts. She has agreed to the Category 1 Plan.  ? ?We will obtain labs today previously ordered, and will go over them at her next office visit. ? ?Exercise goals: For substantial health benefits, adults should do at least 150 minutes (2 hours and 30 minutes) a week of moderate-intensity, or 75 minutes (1 hour and 15 minutes) a week of vigorous-intensity aerobic physical activity, or an equivalent combination of moderate- and vigorous-intensity aerobic activity. Aerobic activity should be performed in episodes of at least 10 minutes,  and preferably, it should be spread throughout the week. ? ?Behavioral modification strategies: meal planning and cooking strategies and keeping healthy foods in the home. ? ?Tina Hinton has agreed to follow-up with our clinic in 5 to 6 weeks. She was informed of the importance of frequent follow-up visits to maximize her success with intensive lifestyle modifications for her multiple health conditions.  ? ?Objective:  ? ?Blood pressure 96/62, pulse 78, temperature 97.9 ?F (36.6 ?C), height '5\' 1"'$  (1.549 m), weight 227 lb (103 kg), SpO2 98 %. ?Body mass index is 42.89 kg/m?. ? ?General: Cooperative, alert, well developed, in no acute distress. ?HEENT: Conjunctivae and lids unremarkable. ?Cardiovascular: Regular rhythm.  ?Lungs: Normal work of breathing. ?Neurologic: No focal deficits.  ? ?Lab Results  ?Component Value Date  ? CREATININE 0.78 05/14/2021  ? BUN 22 05/14/2021  ? NA 144 05/14/2021  ? K 5.1 05/14/2021  ? CL 103 05/14/2021  ? CO2 25 05/14/2021  ? ?Lab Results  ?Component Value Date  ? ALT 19 05/14/2021  ? AST 22 05/14/2021  ? ALKPHOS 76 05/14/2021  ? BILITOT <0.2 05/14/2021  ? ?Lab Results  ?Component Value Date  ? HGBA1C 5.9 (H) 09/17/2021  ? HGBA1C 5.9 (H) 05/14/2021  ? HGBA1C 5.9 (H) 02/12/2021  ? ?Lab Results  ?Component Value Date  ? INSULIN 11.6 09/17/2021  ? INSULIN 12.5 05/14/2021  ? INSULIN 11.8 02/12/2021  ? ?Lab Results  ?Component Value Date  ? TSH 1.42 11/16/2020  ? ?Lab Results  ?Component Value Date  ? CHOL 146 05/14/2021  ? HDL 49 05/14/2021  ? Herman 82 05/14/2021  ? LDLDIRECT 155.0 06/25/2015  ? TRIG 77 05/14/2021  ? CHOLHDL 3.5 11/16/2020  ? ?Lab Results  ?Component Value Date  ? VD25OH 64.9 09/17/2021  ? VD25OH 27.9 (L) 05/14/2021  ? ?Lab Results  ?Component Value Date  ? WBC 8.7 11/16/2020  ? HGB 12.2 11/16/2020  ? HCT 36.8 11/16/2020  ? MCV 89.8 11/16/2020  ? PLT 250 11/16/2020  ? ?No results found for: IRON, TIBC, FERRITIN ? ?Attestation Statements:  ? ?Reviewed by clinician on day of  visit: allergies, medications, problem list, medical history, surgical history, family history, social history, and previous encounter notes. ? ? ?I, Trixie Dredge, am acting as transcriptionist for [employer], DO. ? ?I have reviewed the above documentation for accuracy and completeness, and I agree with the above. Marjory Sneddon, D.O. ? ?The [place] was signed into law in 2016 which includes the topic of electronic health records.  This provides immediate access to information in MyChart.  This includes consultation notes, operative notes, office notes, lab results and pathology reports.  If you have any questions about what you read please let us know at your next visit so we can discuss your concerns and take corrective action if need be.  We are right here with you. ? ? ?

## 2021-10-31 ENCOUNTER — Encounter (INDEPENDENT_AMBULATORY_CARE_PROVIDER_SITE_OTHER): Payer: Self-pay | Admitting: Family Medicine

## 2021-10-31 ENCOUNTER — Ambulatory Visit (INDEPENDENT_AMBULATORY_CARE_PROVIDER_SITE_OTHER): Payer: Federal, State, Local not specified - PPO | Admitting: Family Medicine

## 2021-10-31 VITALS — BP 115/77 | HR 75 | Temp 97.8°F | Ht 61.0 in | Wt 234.0 lb

## 2021-10-31 DIAGNOSIS — Z9189 Other specified personal risk factors, not elsewhere classified: Secondary | ICD-10-CM

## 2021-10-31 DIAGNOSIS — Z6841 Body Mass Index (BMI) 40.0 and over, adult: Secondary | ICD-10-CM

## 2021-10-31 DIAGNOSIS — R7303 Prediabetes: Secondary | ICD-10-CM | POA: Diagnosis not present

## 2021-10-31 DIAGNOSIS — E669 Obesity, unspecified: Secondary | ICD-10-CM | POA: Diagnosis not present

## 2021-10-31 DIAGNOSIS — E559 Vitamin D deficiency, unspecified: Secondary | ICD-10-CM

## 2021-10-31 DIAGNOSIS — Z7984 Long term (current) use of oral hypoglycemic drugs: Secondary | ICD-10-CM

## 2021-10-31 MED ORDER — METFORMIN HCL 500 MG PO TABS: ORAL_TABLET | ORAL | 0 refills | Status: DC

## 2021-11-09 NOTE — Progress Notes (Signed)
Chief Complaint:   OBESITY Tina Hinton is here to discuss her progress with her obesity treatment plan along with follow-up of her obesity related diagnoses. Tina Hinton is on the Category 1 Plan and states she is following her eating plan approximately 80% of the time. Tina Hinton states she is not currently exercising.  Today's visit was #: 81 Starting weight: 253 lbs Starting date: 02/12/2021 Today's weight: 234 lbs Today's date: 10/31/2021 Total lbs lost to date: 19 Total lbs lost since last in-office visit: +7  Interim History: Tina Hinton just returned from [place] after spending a month with her sister. She reports that she did not eat poorly but did drink a lot more alcohol. Pt is back at it and on plan.  Subjective:   1. Vitamin D deficiency Discussed labs with patient today. She is currently taking prescription vitamin D 50,000 IU each week. She denies nausea, vomiting or muscle weakness. Pt's Vit D level is up from 27.9 to 64.9.  Lab Results  Component Value Date   VD25OH 64.9 09/17/2021   VD25OH 27.9 (L) 05/14/2021   2. Pre-diabetes Discussed labs with patient today. Tina Hinton denies hunger or cravings. Her fasting insulin is 11.6 and A1c is unchanged from prior lab at 5.9.  Lab Results  Component Value Date   HGBA1C 5.9 (H) 09/17/2021   Lab Results  Component Value Date   INSULIN 11.6 09/17/2021   INSULIN 12.5 05/14/2021   INSULIN 11.8 02/12/2021   3. At risk for impaired metabolic function Tina Hinton is at increased risk for impaired metabolic function due to current nutrition and muscle mass.  Assessment/Plan:   1. Vitamin D deficiency Low Vitamin D level contributes to fatigue and are associated with obesity, breast, and colon cancer. She agrees to continue to take prescription Vitamin D '@50'$ ,000 IU every week and will follow-up for routine testing of Vitamin D, at least 2-3 times per year to avoid over-replacement.  2. Pre-diabetes [name] will continue to work on  weight loss, exercise, and decreasing simple carbohydrates to help decrease the risk of diabetes. Continue current treatment plan.  Refill- metFORMIN (GLUCOPHAGE) 500 MG tablet; 1 po qd with lunch, 1 po q dinner  Dispense: 60 tablet; Refill: 0  3. At risk for impaired metabolic function Due to [name]'s current state of health and medical condition(s), she is at a significantly higher risk for impaired metabolic function. At least 9 minutes was spent on counseling Dyneshia about these concerns today. This places the patient at a much greater risk to subsequently develop cardio-pulmonary conditions that can negatively affect the patient's quality of life. I stressed the importance of reversing these risks factors. The initial goal is to lose at least 5-10% of starting weight to help reduce risk factors. Counseling: Intensive lifestyle modifications discussed with [name] as the most appropriate first line treatment. She will continue to work on diet, exercise, and weight loss efforts. We will continue to reassess these conditions on a fairly regular basis in an attempt to decrease the patient's overall morbidity and mortality.  4. Obesity, Current BMI 43.3 Tina Hinton is currently in the action stage of change. As such, her goal is to continue with weight loss efforts. She has agreed to the Category 1 Plan.   Exercise goals:  As is  Behavioral modification strategies: meal planning and cooking strategies and planning for success.  Tina Hinton has agreed to follow-up with our clinic in 2 weeks. She was informed of the importance of frequent follow-up visits to maximize her success  with intensive lifestyle modifications for her multiple health conditions.   Objective:   Blood pressure 115/77, pulse 75, temperature 97.8 F (36.6 C), height '5\' 1"'$  (1.549 m), weight 234 lb (106.1 kg), SpO2 98 %. Body mass index is 44.21 kg/m.  General: Cooperative, alert, well developed, in no acute distress. HEENT:  Conjunctivae and lids unremarkable. Cardiovascular: Regular rhythm.  Lungs: Normal work of breathing. Neurologic: No focal deficits.   Lab Results  Component Value Date   CREATININE 0.78 05/14/2021   BUN 22 05/14/2021   NA 144 05/14/2021   K 5.1 05/14/2021   CL 103 05/14/2021   CO2 25 05/14/2021   Lab Results  Component Value Date   ALT 19 05/14/2021   AST 22 05/14/2021   ALKPHOS 76 05/14/2021   BILITOT <0.2 05/14/2021   Lab Results  Component Value Date   HGBA1C 5.9 (H) 09/17/2021   HGBA1C 5.9 (H) 05/14/2021   HGBA1C 5.9 (H) 02/12/2021   Lab Results  Component Value Date   INSULIN 11.6 09/17/2021   INSULIN 12.5 05/14/2021   INSULIN 11.8 02/12/2021   Lab Results  Component Value Date   TSH 1.42 11/16/2020   Lab Results  Component Value Date   CHOL 146 05/14/2021   HDL 49 05/14/2021   LDLCALC 82 05/14/2021   LDLDIRECT 155.0 06/25/2015   TRIG 77 05/14/2021   CHOLHDL 3.5 11/16/2020   Lab Results  Component Value Date   VD25OH 64.9 09/17/2021   VD25OH 27.9 (L) 05/14/2021   Lab Results  Component Value Date   WBC 8.7 11/16/2020   HGB 12.2 11/16/2020   HCT 36.8 11/16/2020   MCV 89.8 11/16/2020   PLT 250 11/16/2020   Attestation Statements:   Reviewed by clinician on day of visit: allergies, medications, problem list, medical history, surgical history, family history, social history, and previous encounter notes.  I, Kathlene November, BS, CMA, am acting as transcriptionist for [employer], DO.  I have reviewed the above documentation for accuracy and completeness, and I agree with the above. Marjory Sneddon, D.O.  The [place] was signed into law in 2016 which includes the topic of electronic health records.  This provides immediate access to information in MyChart.  This includes consultation notes, operative notes, office notes, lab results and pathology reports.  If you have any questions about what you read please let us know at  your next visit so we can discuss your concerns and take corrective action if need be.  We are right here with you.

## 2021-11-18 ENCOUNTER — Other Ambulatory Visit (HOSPITAL_BASED_OUTPATIENT_CLINIC_OR_DEPARTMENT_OTHER): Payer: Self-pay | Admitting: Family Medicine

## 2021-11-18 ENCOUNTER — Encounter: Payer: Self-pay | Admitting: Family Medicine

## 2021-11-18 ENCOUNTER — Ambulatory Visit (HOSPITAL_BASED_OUTPATIENT_CLINIC_OR_DEPARTMENT_OTHER)
Admission: RE | Admit: 2021-11-18 | Discharge: 2021-11-18 | Disposition: A | Discharge status: Home / Self Care | Payer: Federal, State, Local not specified - PPO | Source: Ambulatory Visit | Attending: Family Medicine | Admitting: Family Medicine

## 2021-11-18 ENCOUNTER — Ambulatory Visit (INDEPENDENT_AMBULATORY_CARE_PROVIDER_SITE_OTHER): Payer: Federal, State, Local not specified - PPO | Admitting: Family Medicine

## 2021-11-18 VITALS — BP 126/70 | HR 52 | Temp 97.4°F | Resp 20 | Ht 61.0 in | Wt 238.4 lb

## 2021-11-18 DIAGNOSIS — Z0000 Encounter for general adult medical examination without abnormal findings: Secondary | ICD-10-CM

## 2021-11-18 DIAGNOSIS — M25552 Pain in left hip: Secondary | ICD-10-CM | POA: Diagnosis not present

## 2021-11-18 DIAGNOSIS — M1612 Unilateral primary osteoarthritis, left hip: Secondary | ICD-10-CM | POA: Diagnosis not present

## 2021-11-18 DIAGNOSIS — E785 Hyperlipidemia, unspecified: Secondary | ICD-10-CM

## 2021-11-18 DIAGNOSIS — M47816 Spondylosis without myelopathy or radiculopathy, lumbar region: Secondary | ICD-10-CM | POA: Diagnosis not present

## 2021-11-18 DIAGNOSIS — Z23 Encounter for immunization: Secondary | ICD-10-CM

## 2021-11-18 DIAGNOSIS — M170 Bilateral primary osteoarthritis of knee: Secondary | ICD-10-CM

## 2021-11-18 DIAGNOSIS — Z6841 Body Mass Index (BMI) 40.0 and over, adult: Secondary | ICD-10-CM

## 2021-11-18 DIAGNOSIS — Z1231 Encounter for screening mammogram for malignant neoplasm of breast: Secondary | ICD-10-CM

## 2021-11-18 DIAGNOSIS — R7303 Prediabetes: Secondary | ICD-10-CM | POA: Diagnosis not present

## 2021-11-18 DIAGNOSIS — E559 Vitamin D deficiency, unspecified: Secondary | ICD-10-CM | POA: Diagnosis not present

## 2021-11-18 DIAGNOSIS — L309 Dermatitis, unspecified: Secondary | ICD-10-CM

## 2021-11-18 MED ORDER — DICLOFENAC SODIUM 75 MG PO TBEC: 75.0000 mg | DELAYED_RELEASE_TABLET | Freq: Two times a day (BID) | ORAL | 1 refills | Status: DC

## 2021-11-18 MED ORDER — DOXYCYCLINE HYCLATE 100 MG PO TABS: 100.0000 mg | ORAL_TABLET | Freq: Two times a day (BID) | ORAL | 0 refills | Status: DC

## 2021-11-18 NOTE — Patient Instructions (Signed)
Preventive Care 65 Years and Older, Female Preventive care refers to lifestyle choices and visits with your health care provider that can promote health and wellness. Preventive care visits are also called wellness exams. What can I expect for my preventive care visit? Counseling Your health care provider may ask you questions about your: Medical history, including: Past medical problems. Family medical history. Pregnancy and menstrual history. History of falls. Current health, including: Memory and ability to understand (cognition). Emotional well-being. Home life and relationship well-being. Sexual activity and sexual health. Lifestyle, including: Alcohol, nicotine or tobacco, and drug use. Access to firearms. Diet, exercise, and sleep habits. Work and work environment. Sunscreen use. Safety issues such as seatbelt and bike helmet use. Physical exam Your health care provider will check your: Height and weight. These may be used to calculate your BMI (body mass index). BMI is a measurement that tells if you are at a healthy weight. Waist circumference. This measures the distance around your waistline. This measurement also tells if you are at a healthy weight and may help predict your risk of certain diseases, such as type 2 diabetes and high blood pressure. Heart rate and blood pressure. Body temperature. Skin for abnormal spots. What immunizations do I need?  Vaccines are usually given at various ages, according to a schedule. Your health care provider will recommend vaccines for you based on your age, medical history, and lifestyle or other factors, such as travel or where you work. What tests do I need? Screening Your health care provider may recommend screening tests for certain conditions. This may include: Lipid and cholesterol levels. Hepatitis C test. Hepatitis B test. HIV (human immunodeficiency virus) test. STI (sexually transmitted infection) testing, if you are at  risk. Lung cancer screening. Colorectal cancer screening. Diabetes screening. This is done by checking your blood sugar (glucose) after you have not eaten for a while (fasting). Mammogram. Talk with your health care provider about how often you should have regular mammograms. BRCA-related cancer screening. This may be done if you have a family history of breast, ovarian, tubal, or peritoneal cancers. Bone density scan. This is done to screen for osteoporosis. Talk with your health care provider about your test results, treatment options, and if necessary, the need for more tests. Follow these instructions at home: Eating and drinking  Eat a diet that includes fresh fruits and vegetables, whole grains, lean protein, and low-fat dairy products. Limit your intake of foods with high amounts of sugar, saturated fats, and salt. Take vitamin and mineral supplements as recommended by your health care provider. Do not drink alcohol if your health care provider tells you not to drink. If you drink alcohol: Limit how much you have to 0-1 drink a day. Know how much alcohol is in your drink. In the [place] one drink equals one 12 oz bottle of beer (355 mL), one 5 oz glass of wine (148 mL), or one 1 oz glass of hard liquor (44 mL). Lifestyle Brush your teeth every morning and night with fluoride toothpaste. Floss one time each day. Exercise for at least 30 minutes 5 or more days each week. Do not use any products that contain nicotine or tobacco. These products include cigarettes, chewing tobacco, and vaping devices, such as e-cigarettes. If you need help quitting, ask your health care provider. Do not use drugs. If you are sexually active, practice safe sex. Use a condom or other form of protection in order to prevent STIs. Take aspirin only as told by   your health care provider. Make sure that you understand how much to take and what form to take. Work with your health care provider to find out whether it  is safe and beneficial for you to take aspirin daily. Ask your health care provider if you need to take a cholesterol-lowering medicine (statin). Find healthy ways to manage stress, such as: Meditation, yoga, or listening to music. Journaling. Talking to a trusted person. Spending time with friends and family. Minimize exposure to UV radiation to reduce your risk of skin cancer. Safety Always wear your seat belt while driving or riding in a vehicle. Do not drive: If you have been drinking alcohol. Do not ride with someone who has been drinking. When you are tired or distracted. While texting. If you have been using any mind-altering substances or drugs. Wear a helmet and other protective equipment during sports activities. If you have firearms in your house, make sure you follow all gun safety procedures. What's next? Visit your health care provider once a year for an annual wellness visit. Ask your health care provider how often you should have your eyes and teeth checked. Stay up to date on all vaccines. This information is not intended to replace advice given to you by your health care provider. Make sure you discuss any questions you have with your health care provider. Document Revised: 11/21/2020 Document Reviewed: 11/21/2020 Elsevier Patient Education  [place].

## 2021-11-18 NOTE — Progress Notes (Signed)
Subjective:   By signing my name below, I, Tina Hinton, attest that this documentation has been prepared under the direction and in the presence of Ann Held DO 11/18/2021   Patient ID: Tina Hinton, female    DOB: 03/25/1956, 66 y.o.   MRN: [id number]  Chief Complaint  Patient presents with   Annual Exam    Pt states not fasting     HPI Patient is in today for a comprehensive physical exam.  She complains of red spots on her upper back that appeared in 03/2021. Since then, the spot has not been healing well. Associated symptoms include itching. When she scratches the area, the spots spread. She does not know if the spots are caused by insects. She is interested in being prescribed antibiotics.  She complains of pain in her left hip and groin area with radiation down her knee. She states that pain has been going on for the past few months. Walking does not worsen pain but riding bikes does. If she turns while in bed, she gets the shooting pain down her knee. She also states that when she is standing up, she feels similar pain. She was previously given injections in the area and states that pain was temporarily relieved. She denies of her legs giving out on her or any falls.   She is losing weight. She states that as of this morning, she weighed 234 lbs. She is regularly going to [facility]. She is satisfied with her weight loss.  Wt Readings from Last 3 Encounters:  11/18/21 238 lb 6.4 oz (108.1 kg)  10/31/21 234 lb (106.1 kg)  09/17/21 227 lb (103 kg)   She reports that she was diagnosed with moderate hearing loss in both of her ears. She is being recommended to use hearing aids. She is planning to get aids.   She is requesting to get forms for her handicap sticker.   She denies having any fever, new muscle pain, new moles, congestion, sinus pain, sore throat, chest pain, palpations, cough, SOB ,wheezing,n/v/d constipation, blood in stool, dysuria,  frequency, hematuria, at this time   Her sister was diagnosed with a tumor in her fallopian tube.  Colonoscopy last completed on 02/05/2017 Dexa last completed on 11/27/2020 Mammogram last completed on 11/27/2020 She is maintaining a healthy diet.  She states that she is going to get her next vision exam scheduled.  Past Medical History:  Diagnosis Date   Ankle fracture    Right   Anxiety    Arthritis    hands, knees   Asthma    Bilateral knee pain    Chest pain    Chicken pox    Clavicle fracture    Left   Depression    Foot fracture, left    GERD (gastroesophageal reflux disease)    Hyperlipidemia    IBS (irritable bowel syndrome) 01/19/2017   Joint pain    Low back pain    Osteoarthritis    Other fatigue    Palpitations    Panic attacks    Seasonal allergies    Shortness of breath on exertion    Swallowing difficulty    Uterine polyp    ablation    Past Surgical History:  Procedure Laterality Date   BIOSPY     HEAD   CESAREAN SECTION     COLONOSCOPY     ENDOMETRIAL ABLATION     MANDIBLE SURGERY     TONSILLECTOMY  TUBAL LIGATION     UPPER GASTROINTESTINAL ENDOSCOPY      Family History  Problem Relation Age of Onset   Obesity Mother    Cancer Mother    Arthritis Mother    Breast cancer Mother 82   Cancer Sister    Arthritis Maternal Grandmother    Heart disease Maternal Grandmother    Diabetes Maternal Grandmother    Breast cancer Paternal Grandmother    Prostate cancer Paternal Grandfather    Cancer Maternal Aunt    Breast cancer Maternal Aunt    Obesity Other    Colon cancer Neg Hx    Esophageal cancer Neg Hx    Rectal cancer Neg Hx    Stomach cancer Neg Hx     Social History   Socioeconomic History   Marital status: Divorced    Spouse name: Not on file   Number of children: Not on file   Years of education: Not on file   Highest education level: Not on file  Occupational History   Occupation: Surveyor, 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   Social Determinants of Health   Financial Resource Strain: Not on file  Food Insecurity: Not on file  Transportation Needs: Not on file  Physical Activity: Not on file  Stress: Not on file  Social Connections: Not on file  Intimate Partner Violence: Not on file    Outpatient Medications Prior to Visit  Medication Sig Dispense Refill   albuterol (VENTOLIN HFA) 108 (90 Base) MCG/ACT inhaler TAKE 2 PUFFS BY MOUTH 4 TIMES A DAY AS NEEDED 6.7 g 2   aspirin 81 MG chewable tablet Chew 81 mg by mouth daily.     beclomethasone (QVAR REDIHALER) 40 MCG/ACT inhaler Inhale 2 puffs into the lungs 2 (two) times daily. 1 each 2   cyclobenzaprine (FLEXERIL) 10 MG tablet Take 10 mg by mouth 2 (two) times daily as needed for muscle spasms.     diclofenac sodium (VOLTAREN) 1 % GEL Apply 2 g topically 4 (four) times daily as needed.     dicyclomine (BENTYL) 20 MG tablet 40 mg qid prn 60 tablet 1   fenofibrate 160 MG tablet Take 1 tablet (160 mg total) by mouth daily. 90 tablet 0   lansoprazole (PREVACID) 30 MG capsule Take 30 mg by mouth in the morning and at bedtime.     Loratadine (CLARITIN PO) Take 10 mg by mouth daily. Reported on 10/11/2015     meclizine (ANTIVERT) 25 MG tablet Take by mouth.     metFORMIN (GLUCOPHAGE) 500 MG tablet 1 po qd with lunch, 1 po q dinner 60 tablet 0   ondansetron (ZOFRAN) 4 MG tablet Take 1 tablet (4 mg total) by mouth every 8 (eight) hours as needed for nausea or vomiting. 20 tablet 0   PARoxetine (PAXIL) 10 MG tablet Take 1 tablet (10 mg total) by mouth daily. 90 tablet 0   rosuvastatin (CRESTOR) 20 MG  tablet TAKE 1 TABLET BY MOUTH EVERYDAY AT BEDTIME 90 tablet 1   traZODone (DESYREL) 50  MG tablet 1-2 q hs 60 tablet 0   triamcinolone cream (KENALOG) 0.1 % APPLY TO AFFECTED AREA TWICE A DAY 30 g 0   Vitamin D, Ergocalciferol, (DRISDOL) 1.25 MG (50000 UNIT) CAPS capsule Take 1 capsule (50,000 Units total) by mouth every 7 (seven) days. 12 capsule 0   diclofenac (VOLTAREN) 75 MG EC tablet TAKE 1 TABLET BY MOUTH TWICE A DAY 180 tablet 1   No facility-administered medications prior to visit.    Allergies  Allergen Reactions   Other Rash    entexla   Phenylephrine-Guaifenesin Rash    Review of Systems  Constitutional:  Negative for fever and malaise/fatigue.  HENT:  Negative for congestion, sinus pain and sore throat.   Eyes:  Negative for blurred vision.  Respiratory:  Negative for cough, shortness of breath and wheezing.   Cardiovascular:  Negative for chest pain, palpitations and leg swelling.  Gastrointestinal:  Negative for blood in stool, constipation, diarrhea, nausea and vomiting.  Genitourinary:  Negative for dysuria, frequency and hematuria.  Musculoskeletal:  Positive for joint pain (Left Hip and Groin). Negative for back pain, falls and myalgias.  Skin:  Positive for itching (Upper Back). Negative for rash.       (-) New Moles (+) Red Spots on Upper Back  Neurological:  Negative for loss of consciousness and headaches.       Objective:    Physical Exam Vitals and nursing note reviewed.  Constitutional:      General: She is not in acute distress.    Appearance: Normal appearance. She is well-developed. She is not ill-appearing.  HENT:     Head: Normocephalic and atraumatic.     Right Ear: Tympanic membrane, ear canal and external ear normal.     Left Ear: Tympanic membrane, ear canal and external ear normal.     Nose: Nose normal.  Eyes:     Extraocular Movements: Extraocular movements intact.     Pupils: Pupils are equal, round, and reactive to light.   Cardiovascular:     Rate and Rhythm: Normal rate and regular rhythm.     Heart sounds: Normal heart sounds. No murmur heard. Pulmonary:     Effort: Pulmonary effort is normal. No respiratory distress.     Breath sounds: Normal breath sounds. No wheezing or rales.  Chest:     Chest wall: No tenderness.  Abdominal:     General: Bowel sounds are normal. There is no distension.     Palpations: Abdomen is soft.     Tenderness: There is no abdominal tenderness. There is no guarding.  Musculoskeletal:     Cervical back: Normal range of motion and neck supple.  Skin:    General: Skin is warm and dry.  Neurological:     Mental Status: She is alert and oriented to person, place, and time.  Psychiatric:        Behavior: Behavior normal.        Thought Content: Thought content normal.        Judgment: Judgment normal.     BP 126/70 (BP Location: Left Arm, Patient Position: Sitting, Cuff Size: Large)   Pulse (!) 52   Temp (!) 97.4 F (36.3 C) (Oral)   Resp 20   Ht 5' 1" (1.549 m)   Wt 238 lb 6.4 oz (108.1 kg)   SpO2 98%   BMI 45.05 kg/m  Wt Readings from Last 3 Encounters:  11/18/21 238 lb 6.4 oz (108.1 kg)  10/31/21 234 lb (106.1  kg)  09/17/21 227 lb (103 kg)    Diabetic Foot Exam - Simple   No data filed    Lab Results  Component Value Date   WBC 8.4 11/18/2021   HGB 12.6 11/18/2021   HCT 38.4 11/18/2021   PLT 261.0 11/18/2021   GLUCOSE 95 11/18/2021   CHOL 174 11/18/2021   TRIG 166.0 (H) 11/18/2021   HDL 48.30 11/18/2021   LDLDIRECT 155.0 06/25/2015   LDLCALC 92 11/18/2021   ALT 24 11/18/2021   AST 24 11/18/2021   NA 140 11/18/2021   K 4.8 11/18/2021   CL 103 11/18/2021   CREATININE 0.92 11/18/2021   BUN 28 (H) 11/18/2021   CO2 29 11/18/2021   TSH 1.42 11/16/2020   HGBA1C 6.0 11/18/2021   MICROALBUR 0.8 11/16/2020    Lab Results  Component Value Date   TSH 1.42 11/16/2020   Lab Results  Component Value Date   WBC 8.4 11/18/2021   HGB 12.6  11/18/2021   HCT 38.4 11/18/2021   MCV 90.2 11/18/2021   PLT 261.0 11/18/2021   Lab Results  Component Value Date   NA 140 11/18/2021   K 4.8 11/18/2021   CO2 29 11/18/2021   GLUCOSE 95 11/18/2021   BUN 28 (H) 11/18/2021   CREATININE 0.92 11/18/2021   BILITOT 0.3 11/18/2021   ALKPHOS 71 11/18/2021   AST 24 11/18/2021   ALT 24 11/18/2021   PROT 6.8 11/18/2021   ALBUMIN 4.3 11/18/2021   CALCIUM 9.6 11/18/2021   EGFR 84 05/14/2021   GFR 64.93 11/18/2021   Lab Results  Component Value Date   CHOL 174 11/18/2021   Lab Results  Component Value Date   HDL 48.30 11/18/2021   Lab Results  Component Value Date   LDLCALC 92 11/18/2021   Lab Results  Component Value Date   TRIG 166.0 (H) 11/18/2021   Lab Results  Component Value Date   CHOLHDL 4 11/18/2021   Lab Results  Component Value Date   HGBA1C 6.0 11/18/2021       Assessment & Plan:   Problem List Items Addressed This Visit       Unprioritized   Osteoarthritis of both knees   Relevant Medications   diclofenac (VOLTAREN) 75 MG EC tablet   Prediabetes   Relevant Orders   Comprehensive metabolic panel (Completed)   Hemoglobin A1c (Completed)   Vitamin D deficiency   Relevant Orders   VITAMIN D 25 Hydroxy (Vit-D Deficiency, Fractures) (Completed)   Preventative health care - Primary    ghm utd Check labs  See avs       Relevant Orders   CBC with Differential/Platelet (Completed)   Comprehensive metabolic panel (Completed)   Hemoglobin A1c (Completed)   Lipid panel (Completed)   VITAMIN D 25 Hydroxy (Vit-D Deficiency, Fractures) (Completed)   Hyperlipidemia LDL goal <100    Encourage heart healthy diet such as MIND or DASH diet, increase exercise, avoid trans fats, simple carbohydrates and processed foods, consider a krill or fish or flaxseed oil cap daily.       Class 3 severe obesity with serious comorbidity and body mass index (BMI) of 45.0 to 49.9 in adult [facility])    con't with diet and  exercise      Other Visit Diagnoses     Hyperlipidemia, unspecified hyperlipidemia type       Relevant Orders   CBC with Differential/Platelet (Completed)   Comprehensive metabolic panel (Completed)   Hemoglobin A1c (Completed)   Lipid  panel (Completed)   VITAMIN D 25 Hydroxy (Vit-D Deficiency, Fractures) (Completed)   Dermatitis       Relevant Medications   doxycycline (VIBRA-TABS) 100 MG tablet   Need for pneumococcal 20-valent conjugate vaccination       Relevant Orders   Pneumococcal conjugate vaccine 20-valent (Prevnar 20)   Pain of left hip       Relevant Orders   DG Hip Unilat W OR W/O Pelvis 2-3 Views Left (Completed)       Meds ordered this encounter  Medications   diclofenac (VOLTAREN) 75 MG EC tablet    Sig: Take 1 tablet (75 mg total) by mouth 2 (two) times daily.    Dispense:  180 tablet    Refill:  1   doxycycline (VIBRA-TABS) 100 MG tablet    Sig: Take 1 tablet (100 mg total) by mouth 2 (two) times daily.    Dispense:  20 tablet    Refill:  0    I, Ann Held, DO, personally preformed the services described in this documentation.  All medical record entries made by the scribe were at my direction and in my presence.  I have reviewed the chart and discharge instructions (if applicable) and agree that the record reflects my personal performance and is accurate and complete. 11/18/2021   I,Amber Collins,acting as a scribe for [employer], DO.,have documented all relevant documentation on the behalf of Ann Held, DO,as directed by  Ann Held, DO while in the presence of Ann Held, DO.   Ann Held, DO

## 2021-11-19 ENCOUNTER — Ambulatory Visit (INDEPENDENT_AMBULATORY_CARE_PROVIDER_SITE_OTHER): Payer: Federal, State, Local not specified - PPO | Admitting: Family Medicine

## 2021-11-19 LAB — COMPREHENSIVE METABOLIC PANEL
ALT: 24 U/L (ref 0–35)
AST: 24 U/L (ref 0–37)
Albumin: 4.3 g/dL (ref 3.5–5.2)
Alkaline Phosphatase: 71 U/L (ref 39–117)
BUN: 28 mg/dL — ABNORMAL HIGH (ref 6–23)
CO2: 29 mEq/L (ref 19–32)
Calcium: 9.6 mg/dL (ref 8.4–10.5)
Chloride: 103 mEq/L (ref 96–112)
Creatinine, Ser: 0.92 mg/dL (ref 0.40–1.20)
GFR: 64.93 mL/min (ref >60.00)
Glucose, Bld: 95 mg/dL (ref 70–99)
Potassium: 4.8 mEq/L (ref 3.5–5.1)
Sodium: 140 mEq/L (ref 135–145)
Total Bilirubin: 0.3 mg/dL (ref 0.2–1.2)
Total Protein: 6.8 g/dL (ref 6.0–8.3)

## 2021-11-19 LAB — LIPID PANEL
Cholesterol: 174 mg/dL (ref 0–200)
HDL: 48.3 mg/dL (ref >39.00)
LDL Cholesterol: 92 mg/dL (ref 0–99)
NonHDL: 125.38
Total CHOL/HDL Ratio: 4
Triglycerides: 166 mg/dL — ABNORMAL HIGH (ref 0.0–149.0)
VLDL: 33.2 mg/dL (ref 0.0–40.0)

## 2021-11-19 LAB — CBC WITH DIFFERENTIAL/PLATELET
Basophils Absolute: 0.1 10*3/uL (ref 0.0–0.1)
Basophils Relative: 1 % (ref 0.0–3.0)
Eosinophils Absolute: 0.3 10*3/uL (ref 0.0–0.7)
Eosinophils Relative: 3.1 % (ref 0.0–5.0)
HCT: 38.4 % (ref 36.0–46.0)
Hemoglobin: 12.6 g/dL (ref 12.0–15.0)
Lymphocytes Relative: 28.6 % (ref 12.0–46.0)
Lymphs Abs: 2.4 10*3/uL (ref 0.7–4.0)
MCHC: 32.8 g/dL (ref 30.0–36.0)
MCV: 90.2 fl (ref 78.0–100.0)
Monocytes Absolute: 0.7 10*3/uL (ref 0.1–1.0)
Monocytes Relative: 8.6 % (ref 3.0–12.0)
Neutro Abs: 4.9 10*3/uL (ref 1.4–7.7)
Neutrophils Relative %: 58.7 % (ref 43.0–77.0)
Platelets: 261 10*3/uL (ref 150.0–400.0)
RBC: 4.26 Mil/uL (ref 3.87–5.11)
RDW: 14.5 % (ref 11.5–15.5)
WBC: 8.4 10*3/uL (ref 4.0–10.5)

## 2021-11-19 LAB — HEMOGLOBIN A1C: Hgb A1c MFr Bld: 6 % (ref 4.6–6.5)

## 2021-11-19 LAB — VITAMIN D 25 HYDROXY (VIT D DEFICIENCY, FRACTURES): VITD: 36.45 ng/mL (ref 30.00–100.00)

## 2021-11-19 NOTE — Assessment & Plan Note (Signed)
Encourage heart healthy diet such as MIND or DASH diet, increase exercise, avoid trans fats, simple carbohydrates and processed foods, consider a krill or fish or flaxseed oil cap daily.  °

## 2021-11-19 NOTE — Assessment & Plan Note (Signed)
ghm utd Check labs  See avs  

## 2021-11-19 NOTE — Assessment & Plan Note (Signed)
con't with diet and exercise 

## 2021-11-20 ENCOUNTER — Other Ambulatory Visit: Payer: Self-pay

## 2021-11-20 DIAGNOSIS — M25552 Pain in left hip: Secondary | ICD-10-CM

## 2021-11-21 ENCOUNTER — Other Ambulatory Visit: Payer: Self-pay

## 2021-11-21 DIAGNOSIS — N951 Menopausal and female climacteric states: Secondary | ICD-10-CM

## 2021-11-21 MED ORDER — PAROXETINE HCL 10 MG PO TABS: 10.0000 mg | ORAL_TABLET | Freq: Every day | ORAL | 0 refills | Status: DC

## 2021-11-21 NOTE — Progress Notes (Signed)
Given refill until patient appointment until August appointment. Kathrene Alu RN

## 2021-11-25 ENCOUNTER — Ambulatory Visit (INDEPENDENT_AMBULATORY_CARE_PROVIDER_SITE_OTHER): Payer: Federal, State, Local not specified - PPO

## 2021-11-25 ENCOUNTER — Ambulatory Visit: Payer: Federal, State, Local not specified - PPO | Admitting: Orthopaedic Surgery

## 2021-11-25 VITALS — Wt 236.0 lb

## 2021-11-25 DIAGNOSIS — M25552 Pain in left hip: Secondary | ICD-10-CM

## 2021-11-25 DIAGNOSIS — G8929 Other chronic pain: Secondary | ICD-10-CM

## 2021-11-25 DIAGNOSIS — M25562 Pain in left knee: Secondary | ICD-10-CM

## 2021-11-25 MED ORDER — METHYLPREDNISOLONE ACETATE 40 MG/ML IJ SUSP
40.0000 mg | INTRAMUSCULAR | Status: AC | PRN
  Administered 2021-11-25: 40 mg via INTRA_ARTICULAR

## 2021-11-25 MED ORDER — TRAMADOL HCL 50 MG PO TABS: 100.0000 mg | ORAL_TABLET | Freq: Four times a day (QID) | ORAL | 0 refills | Status: DC | PRN

## 2021-11-25 MED ORDER — LIDOCAINE HCL 1 % IJ SOLN
3.0000 mL | INTRAMUSCULAR | Status: AC | PRN
  Administered 2021-11-25: 3 mL

## 2021-11-25 NOTE — Progress Notes (Signed)
Office Visit Note   Patient: Tina Hinton           Date of Birth: 07/21/1955           MRN: [id number] Visit Date: 11/25/2021              Requested by: [address], [place], [place] [address] [place],  [place] [address] PCP: Carollee Herter, Alferd Apa, DO   Assessment & Plan: Visit Diagnoses:  1. Chronic pain of left knee   2. Acute pain of left knee   3. Pain of left hip     Plan:   I did recommend a steroid injection of the left knee today which she agreed to and tolerated well.  I did counsel her about weight loss which I think will help her the most in terms of feeling better overall.  We will also have her set up for an intra-articular injection under fluoroscopy in her left hip joint with steroid by Dr. Ernestina Patches.  She will continue her walker since she does feel somewhat imbalanced.  I will see her back in a month to see how both these injections are done for her.  She agrees with this treatment plan.  All questions and concerns were answered and addressed.  I will send in some tramadol to help as well.  Follow-Up Instructions: Return in about 4 weeks (around 12/23/2021).   Orders:  Orders Placed This Encounter  Procedures   Large Joint Inj   XR Knee 1-2 Views Left   No orders of the defined types were placed in this encounter.     Procedures: Large Joint Inj: L knee on 11/25/2021 9:15 AM Indications: diagnostic evaluation and pain Details: 22 G 1.5 in needle, superolateral approach  Arthrogram: No  Medications: 3 mL lidocaine 1 %; 40 mg methylPREDNISolone acetate 40 MG/ML Outcome: tolerated well, no immediate complications Procedure, treatment alternatives, risks and benefits explained, specific risks discussed. Consent was given by the patient. Immediately prior to procedure a time out was called to verify the correct patient, procedure, equipment, support staff and site/side marked as required. Patient was prepped and draped in the usual sterile  fashion.       Clinical Data: No additional findings.   Subjective: Chief Complaint  Patient presents with   Left Hip - Follow-up  The patient is a 66 year old female who comes in with left hip and left knee pain.  She embers with a rolling walker.  She is a retired Marine scientist.  She feels like that left leg will give out on her.  The left knee is only been hurting for a few days and she was concerned about the possibility of blood clot.  She does have left groin pain and has fallen a couple times.  She has had to travel back and forth to [place] seeing his sister who has cancer.  She is a prediabetic.  She is on metformin.  Her BMI today is 44.59.  HPI  Review of Systems There is currently listed no chest pain, shortness of breath, fever, chills, nausea, vomiting  Objective: Vital Signs: Wt 236 lb (107 kg)   BMI 44.59 kg/m   Physical Exam She is alert and orient x3 and in no acute distress Ortho Exam Examination of her left hip shows it moves fluidly and fully with just some mild pain in the groin and some pain over the trochanteric area.  There is no blocks to rotation.  Her  left knee shows some pain in the posterior aspect of her knee.  Her calf is soft.  Her knee is ligamentously stable. Specialty Comments:  No specialty comments available.  Imaging: XR Knee 1-2 Views Left  Result Date: 11/25/2021 2 views of the left knee show mild to moderate arthritic changes.  There is calcifications of both meniscus and slight varus malalignment.  There are osteophytes in all 3 compartments.    PMFS History: Patient Active Problem List   Diagnosis Date Noted   Vitamin D deficiency 06/25/2021   Class 3 severe obesity with serious comorbidity and body mass index (BMI) of 45.0 to 49.9 in adult ([place]) 03/12/2021   Prediabetes 03/12/2021   Lumbar strain, subsequent encounter 02/14/2021   Asthma    Vertigo 01/31/2020   Aortic atherosclerosis ([place]) 02/15/2018   IBS (irritable bowel  syndrome) - D 01/19/2017   Primary osteoarthritis of both knees 01/19/2017   Preventative health care 07/30/2015   Morbid obesity ([place]) 07/30/2015   Anxiety 06/25/2015   Hyperlipidemia LDL goal <100 06/25/2015   Sprain of medial collateral ligament of left knee 06/25/2015   Osteoarthritis of both knees 06/25/2015   Degeneration of intervertebral disc of lumbar region 01/04/2015   Spondylolisthesis at L5-S1 level 01/04/2015   Fracture of lateral malleolus 11/14/2014   Foot, fracture, navicular 11/14/2014   Past Medical History:  Diagnosis Date   Ankle fracture    Right   Anxiety    Arthritis    hands, knees   Asthma    Bilateral knee pain    Chest pain    Chicken pox    Clavicle fracture    Left   Depression    Foot fracture, left    GERD (gastroesophageal reflux disease)    Hyperlipidemia    IBS (irritable bowel syndrome) 01/19/2017   Joint pain    Low back pain    Osteoarthritis    Other fatigue    Palpitations    Panic attacks    Seasonal allergies    Shortness of breath on exertion    Swallowing difficulty    Uterine polyp    ablation    Family History  Problem Relation Age of Onset   Obesity Mother    Cancer Mother    Arthritis Mother    Breast cancer Mother 68   Cancer Sister    Arthritis Maternal Grandmother    Heart disease Maternal Grandmother    Diabetes Maternal Grandmother    Breast cancer Paternal Grandmother    Prostate cancer Paternal Grandfather    Cancer Maternal Aunt    Breast cancer Maternal Aunt    Obesity Other    Colon cancer Neg Hx    Esophageal cancer Neg Hx    Rectal cancer Neg Hx    Stomach cancer Neg Hx     Past Surgical History:  Procedure Laterality Date   BIOSPY     HEAD   CESAREAN SECTION     COLONOSCOPY     ENDOMETRIAL ABLATION     MANDIBLE SURGERY     TONSILLECTOMY     TUBAL LIGATION     UPPER GASTROINTESTINAL ENDOSCOPY     Social History   Occupational History   Occupation: Surveyor, 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

## 2021-11-26 ENCOUNTER — Other Ambulatory Visit: Payer: Self-pay

## 2021-11-26 DIAGNOSIS — M25552 Pain in left hip: Secondary | ICD-10-CM

## 2021-11-28 DIAGNOSIS — L814 Other melanin hyperpigmentation: Secondary | ICD-10-CM | POA: Diagnosis not present

## 2021-11-28 DIAGNOSIS — S50861A Insect bite (nonvenomous) of right forearm, initial encounter: Secondary | ICD-10-CM | POA: Diagnosis not present

## 2021-11-28 DIAGNOSIS — D1801 Hemangioma of skin and subcutaneous tissue: Secondary | ICD-10-CM | POA: Diagnosis not present

## 2021-11-28 DIAGNOSIS — L821 Other seborrheic keratosis: Secondary | ICD-10-CM | POA: Diagnosis not present

## 2021-12-03 ENCOUNTER — Ambulatory Visit (HOSPITAL_BASED_OUTPATIENT_CLINIC_OR_DEPARTMENT_OTHER)
Admission: RE | Admit: 2021-12-03 | Discharge: 2021-12-03 | Disposition: A | Discharge status: Home / Self Care | Payer: Federal, State, Local not specified - PPO | Source: Ambulatory Visit | Attending: Family Medicine | Admitting: Family Medicine

## 2021-12-03 ENCOUNTER — Encounter (HOSPITAL_BASED_OUTPATIENT_CLINIC_OR_DEPARTMENT_OTHER): Payer: Self-pay

## 2021-12-03 DIAGNOSIS — Z1231 Encounter for screening mammogram for malignant neoplasm of breast: Secondary | ICD-10-CM | POA: Insufficient documentation

## 2021-12-23 ENCOUNTER — Ambulatory Visit: Payer: Federal, State, Local not specified - PPO | Admitting: Orthopaedic Surgery

## 2021-12-30 ENCOUNTER — Ambulatory Visit: Payer: Self-pay

## 2021-12-30 ENCOUNTER — Ambulatory Visit: Payer: Federal, State, Local not specified - PPO | Admitting: Physical Medicine and Rehabilitation

## 2021-12-30 ENCOUNTER — Encounter: Payer: Self-pay | Admitting: Physical Medicine and Rehabilitation

## 2021-12-30 DIAGNOSIS — M25552 Pain in left hip: Secondary | ICD-10-CM

## 2021-12-30 MED ORDER — TRIAMCINOLONE ACETONIDE 40 MG/ML IJ SUSP
60.0000 mg | INTRAMUSCULAR | Status: AC | PRN
  Administered 2021-12-30: 60 mg via INTRA_ARTICULAR

## 2021-12-30 MED ORDER — BUPIVACAINE HCL 0.25 % IJ SOLN
4.0000 mL | INTRAMUSCULAR | Status: AC | PRN
  Administered 2021-12-30: 4 mL via INTRA_ARTICULAR

## 2021-12-30 NOTE — Progress Notes (Signed)
   Tina Hinton - 66 y.o. female MRN [id number]  Date of birth: [birth year]  Office Visit Note: Visit Date: 12/30/2021 PCP: Ann Held, DO Referred by: Ann Held, *  Subjective: Chief Complaint  Patient presents with   Left Hip - Pain   Left Knee - Pain   HPI:  Tina Hinton is a 66 y.o. female who comes in today at the request of Dr. Jean Rosenthal for planned Left anesthetic hip arthrogram with fluoroscopic guidance.  The patient has failed conservative care including home exercise, medications, time and activity modification.  This injection will be diagnostic and hopefully therapeutic.  Please see requesting physician notes for further details and justification.   ROS Otherwise per HPI.  Assessment & Plan: Visit Diagnoses:    ICD-10-CM   1. Pain in left hip  M25.552 Large Joint Inj: L hip joint    XR C-ARM NO REPORT      Plan: No additional findings.   Meds & Orders: No orders of the defined types were placed in this encounter.   Orders Placed This Encounter  Procedures   Large Joint Inj: L hip joint   XR C-ARM NO REPORT    Follow-up: Return for visit to requesting provider as needed.   Procedures: Large Joint Inj: L hip joint on 12/30/2021 8:49 AM Indications: diagnostic evaluation and pain Details: 22 G 3.5 in needle, fluoroscopy-guided anterior approach  Arthrogram: No  Medications: 4 mL bupivacaine 0.25 %; 60 mg triamcinolone acetonide 40 MG/ML Outcome: tolerated well, no immediate complications  There was excellent flow of contrast producing a partial arthrogram of the hip. The patient did have relief of symptoms during the anesthetic phase of the injection. Procedure, treatment alternatives, risks and benefits explained, specific risks discussed. Consent was given by the patient. Immediately prior to procedure a time out was called to verify the correct patient, procedure, equipment, support staff and site/side marked as  required. Patient was prepped and draped in the usual sterile fashion.          Clinical History: No specialty comments available.     Objective:  VS:  HT:    WT:   BMI:     BP:   HR: bpm  TEMP: ( )  RESP:  Physical Exam   Imaging: XR C-ARM NO REPORT  Result Date: 12/30/2021 Please see Notes tab for imaging impression.

## 2021-12-30 NOTE — Progress Notes (Signed)
Pt state left hip pain that travels to her left groin and knee. Pt state walking and standing for a long period of time makes the pain worse. Pt state she take pain meds to help ease her pain.  Numeric Pain Rating Scale and Functional Assessment Average Pain 2   In the last MONTH (on 0-10 scale) has pain interfered with the following?  1. General activity like being  able to carry out your everyday physical activities such as walking, climbing stairs, carrying groceries, or moving a chair?  Rating(10)    -BT, -Dye Allergies.

## 2022-01-06 ENCOUNTER — Encounter: Payer: Self-pay | Admitting: Orthopaedic Surgery

## 2022-01-06 ENCOUNTER — Ambulatory Visit: Payer: Federal, State, Local not specified - PPO | Admitting: Orthopaedic Surgery

## 2022-01-06 DIAGNOSIS — M25562 Pain in left knee: Secondary | ICD-10-CM

## 2022-01-06 DIAGNOSIS — M25552 Pain in left hip: Secondary | ICD-10-CM | POA: Diagnosis not present

## 2022-01-06 DIAGNOSIS — G8929 Other chronic pain: Secondary | ICD-10-CM

## 2022-01-06 NOTE — Progress Notes (Signed)
The patient is well-known to [place].  She has moderate arthritis in her left hip and her left knee.  She recently had an intra-articular steroid injection in her left hip under fluoroscopy by Dr. Ernestina Patches and that went very well.  When I saw her last, she was needing to ambulate with a rolling walker.  She is an active 66 year old female and is now going without the walker.  She is pain-free.  That includes her left knee as well.  She does have a new mattress from a sleeping standpoint that is really helped her back.  She is working on weight loss.  Her last BMI was 44.  She is very much interested in aquatic therapy.  Examination of her left hip shows a move smoothly and fluidly and her left knee is actually moving smoothly and fluidly.  I agree with a few sessions of aquatic therapy and that she is going look for an aquatic program overall.  We can get this set up in terms of short-term aquatic therapy.  All questions concerns were answered and addressed.

## 2022-01-07 ENCOUNTER — Other Ambulatory Visit: Payer: Self-pay

## 2022-01-07 DIAGNOSIS — M25552 Pain in left hip: Secondary | ICD-10-CM

## 2022-01-08 ENCOUNTER — Encounter: Payer: Self-pay | Admitting: Obstetrics & Gynecology

## 2022-01-08 ENCOUNTER — Encounter: Payer: Self-pay | Admitting: General Practice

## 2022-01-08 ENCOUNTER — Ambulatory Visit (INDEPENDENT_AMBULATORY_CARE_PROVIDER_SITE_OTHER): Payer: Federal, State, Local not specified - PPO | Admitting: Obstetrics & Gynecology

## 2022-01-08 ENCOUNTER — Other Ambulatory Visit (HOSPITAL_COMMUNITY)
Admission: RE | Admit: 2022-01-08 | Discharge: 2022-01-08 | Disposition: A | Discharge status: Home / Self Care | Payer: Federal, State, Local not specified - PPO | Source: Ambulatory Visit | Attending: Obstetrics & Gynecology | Admitting: Obstetrics & Gynecology

## 2022-01-08 VITALS — BP 125/60 | HR 79 | Wt 241.0 lb

## 2022-01-08 DIAGNOSIS — Z803 Family history of malignant neoplasm of breast: Secondary | ICD-10-CM

## 2022-01-08 DIAGNOSIS — Z01419 Encounter for gynecological examination (general) (routine) without abnormal findings: Secondary | ICD-10-CM

## 2022-01-08 DIAGNOSIS — Z8041 Family history of malignant neoplasm of ovary: Secondary | ICD-10-CM | POA: Diagnosis not present

## 2022-01-08 NOTE — Progress Notes (Signed)
Subjective:     Tina Tina Hinton is a 66 y.o. female here for a routine exam.  Current complaints: hot flushes are well controlled with the Paxil. Pt has multiple relative with cancer- specifically breast and ovary. Please see genetic cancer screen form under media.   Gynecologic History No LMP recorded. Patient is postmenopausal. Contraception: post menopausal status Last Pap: 03/24/2018. Results were: abnormal ASCUS Last mammogram: 12/03/2021. Results were: normal  Obstetric History OB History  Gravida Para Term Preterm AB Living  '3 3 3     3  ' SAB IAB Ectopic Multiple Live Births          3    # Outcome Date GA Lbr Len/2nd Weight Sex Delivery Anes PTL Lv  3 Term 1983 [redacted]w[redacted]d  M CS-LTranv Spinal  LIV  2 Term 1981 430w0d M CS-LTranv Spinal  LIV  1 Term 1979 [id number] Vag-Spont EPI  LIV     The following portions of the patient's history were reviewed and updated as appropriate: allergies, current medications, past family history, past medical history, past social history, past surgical history, and problem list.  Review of Systems Pertinent items are noted in HPI.    Objective:  BP 125/60   Pulse 79   Wt 241 lb (109.3 kg)   BMI 45.54 kg/m  General Appearance:    Alert, cooperative, no distress, appears stated age  Head:    Normocephalic, without obvious abnormality, atraumatic  Eyes:    conjunctiva/corneas clear, EOM's intact, both eyes  Ears:    Normal external ear canals, both ears  Nose:   Nares normal, septum midline, mucosa normal, no drainage    or sinus tenderness  Throat:   Lips, mucosa, and tongue normal; teeth and gums normal  Neck:   Supple, symmetrical, trachea midline, no adenopathy;    thyroid:  no enlargement/tenderness/nodules  Back:     Symmetric, no curvature, ROM normal, no CVA tenderness  Lungs:     respirations unlabored  Chest Wall:    No tenderness or deformity   Heart:    Regular rate and rhythm  Breast Exam:    No tenderness, masses, or nipple  abnormality  Abdomen:     Soft, non-tender, bowel sounds active all four quadrants,    no masses, no organomegaly  Genitalia:    Normal female without lesion, discharge or tenderness     Extremities:   Extremities normal, atraumatic, no cyanosis or edema  Pulses:   2+ and symmetric all extremities  Skin:   Skin color, texture, turgor normal, no rashes or lesions     Assessment:    Healthy female exam.    Plan:  Tina Tina Hinton seen today for annual exam.  Diagnoses and all orders for this visit:  Well female exam with routine gynecological exam -     Cytology - PAP( [place])  Family history of ovarian cancer -     Cancel: [facility] ([name]     EmpHuman resources officer+38)  Family history of breast cancer -     Cancel: [facility] ([name]     EmpHuman resources officer+38)   F/u in 1 year or sooner prn   Tina Tina Hinton, M.D., FACCherlynn June

## 2022-01-13 LAB — CYTOLOGY - PAP
Comment: NEGATIVE
Diagnosis: NEGATIVE
High risk HPV: NEGATIVE

## 2022-01-15 ENCOUNTER — Encounter (INDEPENDENT_AMBULATORY_CARE_PROVIDER_SITE_OTHER): Payer: Self-pay

## 2022-01-20 LAB — EMPOWER MULTI-CANCER (2 + 38): REPORT SUMMARY: NEGATIVE

## 2022-01-27 NOTE — Therapy (Signed)
OUTPATIENT PHYSICAL THERAPY LOWER EXTREMITY EVALUATION   Patient Name: Tina Hinton MRN: [id number] DOB:08/31/1955, 66 y.o., female Today's Date: 01/28/2022   PT End of Session - 01/28/22 1624     Visit Number 1    Number of Visits 10    Date for PT Re-Evaluation 03/25/22    Authorization Type BCBS    Progress Note Due on Visit 10    PT Start Time 1617    PT Stop Time 1656    PT Time Calculation (min) 39 min    Activity Tolerance Patient tolerated treatment well    Behavior During Therapy WFL for tasks assessed/performed             Past Medical History:  Diagnosis Date   Ankle fracture    Right   Anxiety    Arthritis    hands, knees   Asthma    Bilateral knee pain    Chest pain    Chicken pox    Clavicle fracture    Left   Depression    Foot fracture, left    GERD (gastroesophageal reflux disease)    Hyperlipidemia    IBS (irritable bowel syndrome) 01/19/2017   Joint pain    Low back pain    Osteoarthritis    Other fatigue    Palpitations    Panic attacks    Seasonal allergies    Shortness of breath on exertion    Swallowing difficulty    Uterine polyp    ablation   Past Surgical History:  Procedure Laterality Date   BIOSPY     HEAD   CESAREAN SECTION     COLONOSCOPY     ENDOMETRIAL ABLATION     MANDIBLE SURGERY     TONSILLECTOMY     TUBAL LIGATION     UPPER GASTROINTESTINAL ENDOSCOPY     Patient Active Problem List   Diagnosis Date Noted   Vitamin D deficiency 06/25/2021   Class 3 severe obesity with serious comorbidity and body mass index (BMI) of 45.0 to 49.9 in adult ([place]) 03/12/2021   Prediabetes 03/12/2021   Lumbar strain, subsequent encounter 02/14/2021   Asthma    Vertigo 01/31/2020   Aortic atherosclerosis ([place]) 02/15/2018   IBS (irritable bowel syndrome) - D 01/19/2017   Primary osteoarthritis of both knees 01/19/2017   Preventative health care 07/30/2015   Morbid obesity ([place]) 07/30/2015   Anxiety 06/25/2015    Hyperlipidemia LDL goal <100 06/25/2015   Sprain of medial collateral ligament of left knee 06/25/2015   Osteoarthritis of both knees 06/25/2015   Degeneration of intervertebral disc of lumbar region 01/04/2015   Spondylolisthesis at L5-S1 level 01/04/2015   Fracture of lateral malleolus 11/14/2014   Foot, fracture, navicular 11/14/2014    PCP: Ann Held, DO  REFERRING PROVIDER: Mcarthur Rossetti, MD  REFERRING DIAG: [phone] (ICD-10-CM) - Pain in left hip  THERAPY DIAG:  Pain in left hip  Muscle weakness (generalized)  Difficulty in walking, not elsewhere classified  Rationale for Evaluation and Treatment Rehabilitation  ONSET DATE: years  SUBJECTIVE:   SUBJECTIVE STATEMENT: Patient reports hip pain for years following accident at work, now she had arthritis in every joint, and has fallen multiple times on knees.  She tried PT before but it was too painful.  She got injections in her L hip and knee and that helped, she is interest in starting aquatic therapy now.   PERTINENT HISTORY: From MD notes on 01/06/22 "The patient is well-known to  [place].  She has moderate arthritis in her left hip and her left knee.  She recently had an intra-articular steroid injection in her left hip under fluoroscopy by Dr. Ernestina Patches and that went very well.  When I saw her last, she was needing to ambulate with a rolling walker.  She is an active 66 year old female and is now going without the walker.  She is pain-free.  That includes her left knee as well.  She does have a new mattress from a sleeping standpoint that is really helped her back.  She is working on weight loss.  Her last BMI was 44.  She is very much interested in aquatic therapy.  PAIN:  Are you having pain? Yes: NPRS scale: 1-2/10 Pain location: L hip intermittently, L knee 1-2/10 intermittent Pain description: ache Aggravating factors: standing, walking Relieving factors: sitting  PRECAUTIONS: Fall  WEIGHT BEARING  RESTRICTIONS No  FALLS:  Has patient fallen in last 6 months? Yes. Number of falls 0 but feels very unsteady and L knee buckles  LIVING ENVIRONMENT: Lives with: lives with their son Lives in: House/apartment Stairs: Yes: Internal: 12 steps; on right going up, on left going up, and can reach both to basement laundry Has following equipment at home: Single point cane and Walker - 4 wheeled  OCCUPATION: nurse - remote  PLOF: Independent with basic ADLs  PATIENT GOALS attend aquatic exercise for strengthening.    OBJECTIVE:   DIAGNOSTIC FINDINGS: Xray L knee 6/19/232 views of the left knee show mild to moderate arthritic changes.  There  is calcifications of both meniscus and slight varus malalignment.  There  are osteophytes in all 3 compartments.  Hip Xray 11/18/21 IMPRESSION: Degenerative changes in the hips, left greater than right. Significant loss of central joint space in the left hip.  PATIENT SURVEYS:  LEFS 30/80  COGNITION:  Overall cognitive status: Within functional limits for tasks assessed     SENSATION: WFL  EDEMA:  NA  MUSCLE LENGTH: NT  POSTURE: rounded shoulders, forward head, and increased lumbar lordosis  PALPATION: NT  LOWER EXTREMITY ROM:   NT  LOWER EXTREMITY MMT:  MMT Right eval Left eval  Hip flexion 5 4  Hip extension 4+ 4  Hip abduction 5 4+  Hip adduction 5 5  Knee flexion 5 4+  Knee extension 5 4+  Ankle dorsiflexion 5 5  Ankle plantarflexion     (Blank rows = not tested)  LOWER EXTREMITY SPECIAL TESTS:   NT  FUNCTIONAL TESTS:  5 times sit to stand: 19.88 sec without UE assist, increased L knee pain.  6 minute walk test: 900 ft, dyspnea, increased L hip pain and trendelburg gait  GAIT: Distance walked: 900 ft Assistive device utilized: None Level of assistance: Complete Independence Comments:  increased L hip pain and trendelburg gait with increased distance.     TODAY'S TREATMENT:  01/28/22 Self Care: see patient  education    PATIENT EDUCATION:  Education details: education on aquatic therapy, recommendations for community programs, what to expect, plan for future. Person educated: Patient Education method: Explanation Education comprehension: verbalized understanding   HOME EXERCISE PROGRAM: NA  ASSESSMENT:  CLINICAL IMPRESSION: Patient is a 67 y.o. female who was seen today for physical therapy evaluation and treatment for L hip pain, with recommendation for aquatic therapy. She reports having difficulty participating in formal land based physical therapy in the past.   Her goal is to transition to community based exercise program to continue to work on  LE strengthening and support weight loss.  She demonstrates impairments in gait, LE strength, and balance.  Today provided information on [facility] aquatic program that is close to home.  Due to impairments, she will start with aquatic therapy at our [place] location until confidence with starting community program.   We did discuss in the future returning to land based therapy if she felt she would be able to tolerate better after building up strength through aquatic therapy.    OBJECTIVE IMPAIRMENTS Abnormal gait, decreased activity tolerance, decreased balance, decreased endurance, decreased mobility, difficulty walking, decreased strength, impaired perceived functional ability, increased muscle spasms, impaired flexibility, and pain.   ACTIVITY LIMITATIONS carrying, lifting, bending, standing, squatting, stairs, transfers, and locomotion level  PARTICIPATION LIMITATIONS: shopping, community activity, and occupation  [place], Time since onset of injury/illness/exacerbation, and 3+ comorbidities: chronic LBP, L knee OA, L hip OA, asthma, obesity  are also affecting patient's functional outcome.   REHAB POTENTIAL: Good  CLINICAL DECISION MAKING: Stable/uncomplicated  EVALUATION COMPLEXITY: Low   GOALS: Goals reviewed with  patient? Yes  SHORT TERM GOALS: Target date: 02/25/2022  Patient will tolerate aquatic therapy without increased pain afterwards.   Baseline: had to stop previous therapy due to increased pain following exercise.  Goal status: INITIAL  LONG TERM GOALS: Target date: 03/11/2022   Patient will demonstrate improved functional LE strength by improving 5x STS to < 14 seconds to decrease fall risk.  Baseline: 19.88 seconds  Goal status: INITIAL  2.  Patient will be confident with transition to community based aquatic program.  Baseline: Info provided on [facility] Goal status: INITIAL  3.  Patient will demonstrated improved function as demonstrated by LEFS >39/80.  Baseline: 30/80 Goal status: INITIAL  4.  Patient will be able to ambulate 1000' without increased L hip pain or gait deviations.  Baseline:  Goal status: INITIAL  PLAN: PT FREQUENCY: 1-2x/week  PT DURATION: 6 weeks  PLANNED INTERVENTIONS: Therapeutic exercises, Therapeutic activity, Neuromuscular re-education, Balance training, Gait training, Patient/Family education, Self Care, Joint mobilization, Stair training, Aquatic Therapy, Dry Needling, Electrical stimulation, Spinal mobilization, Cryotherapy, Moist heat, Traction, Ultrasound, and Manual therapy  PLAN FOR NEXT SESSION: aquatic therapy at [place].    Rennie Natter, PT, DPT  01/28/2022, 6:11 PM

## 2022-01-28 ENCOUNTER — Ambulatory Visit: Payer: Federal, State, Local not specified - PPO | Attending: Orthopaedic Surgery | Admitting: Physical Therapy

## 2022-01-28 ENCOUNTER — Encounter: Payer: Self-pay | Admitting: Physical Therapy

## 2022-01-28 DIAGNOSIS — R262 Difficulty in walking, not elsewhere classified: Secondary | ICD-10-CM | POA: Insufficient documentation

## 2022-01-28 DIAGNOSIS — M6281 Muscle weakness (generalized): Secondary | ICD-10-CM | POA: Diagnosis not present

## 2022-01-28 DIAGNOSIS — M25552 Pain in left hip: Secondary | ICD-10-CM | POA: Diagnosis not present

## 2022-02-06 ENCOUNTER — Telehealth (HOSPITAL_BASED_OUTPATIENT_CLINIC_OR_DEPARTMENT_OTHER): Payer: Self-pay | Admitting: Physical Therapy

## 2022-02-06 ENCOUNTER — Ambulatory Visit (HOSPITAL_BASED_OUTPATIENT_CLINIC_OR_DEPARTMENT_OTHER): Payer: Federal, State, Local not specified - PPO | Admitting: Physical Therapy

## 2022-02-06 NOTE — Telephone Encounter (Signed)
Called pt in regards to missed visit today.  She reports she was uninformed of visit.  Has schedule printed from on land eval which does not include todays visit.  She will be here next week as scheduled.  She was informed of cancellation and no show policy.

## 2022-02-11 ENCOUNTER — Ambulatory Visit (HOSPITAL_BASED_OUTPATIENT_CLINIC_OR_DEPARTMENT_OTHER): Payer: Federal, State, Local not specified - PPO | Attending: Orthopaedic Surgery | Admitting: Physical Therapy

## 2022-02-11 ENCOUNTER — Encounter (HOSPITAL_BASED_OUTPATIENT_CLINIC_OR_DEPARTMENT_OTHER): Payer: Self-pay | Admitting: Physical Therapy

## 2022-02-11 DIAGNOSIS — R262 Difficulty in walking, not elsewhere classified: Secondary | ICD-10-CM

## 2022-02-11 DIAGNOSIS — M25552 Pain in left hip: Secondary | ICD-10-CM | POA: Insufficient documentation

## 2022-02-11 DIAGNOSIS — M6281 Muscle weakness (generalized): Secondary | ICD-10-CM

## 2022-02-11 NOTE — Therapy (Signed)
OUTPATIENT PHYSICAL THERAPY LOWER EXTREMITY TREATMENT NOTE   Patient Name: Tina Hinton MRN: [id number] DOB:Apr 27, 1956, 66 y.o., female Today's Date: 02/11/2022   PT End of Session - 02/11/22 1639     Visit Number 2    Number of Visits 10    Date for PT Re-Evaluation 03/25/22    Authorization Type BCBS    Progress Note Due on Visit 10    PT Start Time 1637    PT Stop Time 1720    PT Time Calculation (min) 43 min    Activity Tolerance Patient tolerated treatment well    Behavior During Therapy WFL for tasks assessed/performed             Past Medical History:  Diagnosis Date   Ankle fracture    Right   Anxiety    Arthritis    hands, knees   Asthma    Bilateral knee pain    Chest pain    Chicken pox    Clavicle fracture    Left   Depression    Foot fracture, left    GERD (gastroesophageal reflux disease)    Hyperlipidemia    IBS (irritable bowel syndrome) 01/19/2017   Joint pain    Low back pain    Osteoarthritis    Other fatigue    Palpitations    Panic attacks    Seasonal allergies    Shortness of breath on exertion    Swallowing difficulty    Uterine polyp    ablation   Past Surgical History:  Procedure Laterality Date   BIOSPY     HEAD   CESAREAN SECTION     COLONOSCOPY     ENDOMETRIAL ABLATION     MANDIBLE SURGERY     TONSILLECTOMY     TUBAL LIGATION     UPPER GASTROINTESTINAL ENDOSCOPY     Patient Active Problem List   Diagnosis Date Noted   Vitamin D deficiency 06/25/2021   Class 3 severe obesity with serious comorbidity and body mass index (BMI) of 45.0 to 49.9 in adult ([place]) 03/12/2021   Prediabetes 03/12/2021   Lumbar strain, subsequent encounter 02/14/2021   Asthma    Vertigo 01/31/2020   Aortic atherosclerosis ([place]) 02/15/2018   IBS (irritable bowel syndrome) - D 01/19/2017   Primary osteoarthritis of both knees 01/19/2017   Preventative health care 07/30/2015   Morbid obesity ([place]) 07/30/2015   Anxiety 06/25/2015    Hyperlipidemia LDL goal <100 06/25/2015   Sprain of medial collateral ligament of left knee 06/25/2015   Osteoarthritis of both knees 06/25/2015   Degeneration of intervertebral disc of lumbar region 01/04/2015   Spondylolisthesis at L5-S1 level 01/04/2015   Fracture of lateral malleolus 11/14/2014   Foot, fracture, navicular 11/14/2014    PCP: Ann Held, DO  REFERRING PROVIDER: Mcarthur Rossetti, MD  REFERRING DIAG: [phone] (ICD-10-CM) - Pain in left hip  THERAPY DIAG:  Pain in left hip  Muscle weakness (generalized)  Difficulty in walking, not elsewhere classified  Rationale for Evaluation and Treatment Rehabilitation  ONSET DATE: years  SUBJECTIVE:   SUBJECTIVE STATEMENT: Pt reports she had injections in her Lt hip and Lt knee approx 1 month ago, "it helped a lot".   She is looking forward to learning some exercises she can perform at [facility] in [facility] (will join soon).   PERTINENT HISTORY: From MD notes on 01/06/22 "The patient is well-known to [place].  She has moderate arthritis in her left hip and her left knee.  She recently had an intra-articular steroid injection in her left hip under fluoroscopy by Dr. Ernestina Patches and that went very well.  When I saw her last, she was needing to ambulate with a rolling walker.  She is an active 66 year old female and is now going without the walker.  She is pain-free.  That includes her left knee as well.  She does have a new mattress from a sleeping standpoint that is really helped her back.  She is working on weight loss.  Her last BMI was 44.  She is very much interested in aquatic therapy.  PAIN:  Are you having pain? Yes: NPRS scale: 1/10 Pain location: L hip intermittently, L knee 1-2/10 intermittent Pain description: ache Aggravating factors: standing, walking Relieving factors: sitting  PRECAUTIONS: Fall  WEIGHT BEARING RESTRICTIONS No  FALLS:  Has patient fallen in last 6 months? Yes. Number of falls 0 but feels very  unsteady and L knee buckles  LIVING ENVIRONMENT: Lives with: lives with their son Lives in: House/apartment Stairs: Yes: Internal: 12 steps; on right going up, on left going up, and can reach both to basement laundry Has following equipment at home: Single point cane and Walker - 4 wheeled  OCCUPATION: nurse - remote  PLOF: Independent with basic ADLs  PATIENT GOALS attend aquatic exercise for strengthening.    OBJECTIVE:   DIAGNOSTIC FINDINGS: Xray L knee 6/19/232 views of the left knee show mild to moderate arthritic changes.  There  is calcifications of both meniscus and slight varus malalignment.  There  are osteophytes in all 3 compartments.  Hip Xray 11/18/21 IMPRESSION: Degenerative changes in the hips, left greater than right. Significant loss of central joint space in the left hip.  PATIENT SURVEYS:  LEFS 30/80  COGNITION:  Overall cognitive status: Within functional limits for tasks assessed     SENSATION: WFL  EDEMA:  NA  MUSCLE LENGTH: NT  POSTURE: rounded shoulders, forward head, and increased lumbar lordosis  PALPATION: NT  LOWER EXTREMITY ROM:   NT  LOWER EXTREMITY MMT:  MMT Right eval Left eval  Hip flexion 5 4  Hip extension 4+ 4  Hip abduction 5 4+  Hip adduction 5 5  Knee flexion 5 4+  Knee extension 5 4+  Ankle dorsiflexion 5 5  Ankle plantarflexion     (Blank rows = not tested)  LOWER EXTREMITY SPECIAL TESTS:   NT  FUNCTIONAL TESTS:  5 times sit to stand: 19.88 sec without UE assist, increased L knee pain.  6 minute walk test: 900 ft, dyspnea, increased L hip pain and trendelburg gait  GAIT: Distance walked: 900 ft Assistive device utilized: None Level of assistance: Complete Independence Comments:  increased L hip pain and trendelburg gait with increased distance.     TODAY'S TREATMENT: Pt seen for aquatic therapy today.  Treatment took place in water 3.25-4.5 ft in depth at the [place]. Temp of water  was 91.  Pt entered/exited the pool via stairs independently with bilat rail.  * intro to aquatics * warm up of walking forward/backward/ side stepping * walking forward kicks (like soccer ball) * holding wall:  hip abdct/ add; hip ext; heel raises; squats;  stork stance with single clam (limited range on LLE); side to side lunge for adductor stretch;   * return to walking backward / forward without support * holding yellow noodle:  forward walking lunge;  side step with squat * stretches at stairs:  quad stretch with foot on 2nd  step; hamstring stretch with foot on 2nd step; fig 4 holding rails; lower back stretch * return to walking backwards/forwards   Pt requires the buoyancy and hydrostatic pressure of water for support, and to offload joints by unweighting joint load by at least 50 % in navel deep water and by at least 75-80% in chest to neck deep water.  Viscosity of the water is needed for resistance of strengthening. Water current perturbations provides challenge to standing balance requiring increased core activation.      PATIENT EDUCATION:  Education details: education on aquatic therapy Person educated: Patient Education method: Explanation Education comprehension: verbalized understanding   HOME EXERCISE PROGRAM: TBA  ASSESSMENT:  CLINICAL IMPRESSION: Pt is confident in aquatic environment; able to take direction from therapist on deck.  Pt had some cramping in Lt hamstring with attempt at standing quad stretch; will need a higher surface next time.  She reported tightness in groin with standing single clam with LLE.  Overall tolerated exercises well without increase in pain.    Her goal is to transition to community based exercise program to continue to work on LE strengthening and support weight loss.  She demonstrates impairments in gait, LE strength, and balance.  Goals are ongoing.    OBJECTIVE IMPAIRMENTS Abnormal gait, decreased activity tolerance, decreased  balance, decreased endurance, decreased mobility, difficulty walking, decreased strength, impaired perceived functional ability, increased muscle spasms, impaired flexibility, and pain.   ACTIVITY LIMITATIONS carrying, lifting, bending, standing, squatting, stairs, transfers, and locomotion level  PARTICIPATION LIMITATIONS: shopping, community activity, and occupation  [place], Time since onset of injury/illness/exacerbation, and 3+ comorbidities: chronic LBP, L knee OA, L hip OA, asthma, obesity  are also affecting patient's functional outcome.   REHAB POTENTIAL: Good  CLINICAL DECISION MAKING: Stable/uncomplicated  EVALUATION COMPLEXITY: Low   GOALS: Goals reviewed with patient? Yes  SHORT TERM GOALS: Target date: 02/25/22 Patient will tolerate aquatic therapy without increased pain afterwards.   Baseline: had to stop previous therapy due to increased pain following exercise.  Goal status: INITIAL  LONG TERM GOALS: Target date: 03/11/22  Patient will demonstrate improved functional LE strength by improving 5x STS to < 14 seconds to decrease fall risk.  Baseline: 19.88 seconds  Goal status: INITIAL  2.  Patient will be confident with transition to community based aquatic program.  Baseline: Info provided on [facility] Goal status: INITIAL  3.  Patient will demonstrated improved function as demonstrated by LEFS >39/80.  Baseline: 30/80 Goal status: INITIAL  4.  Patient will be able to ambulate 1000' without increased L hip pain or gait deviations.  Baseline:  Goal status: INITIAL  PLAN: PT FREQUENCY: 1-2x/week  PT DURATION: 6 weeks  PLANNED INTERVENTIONS: Therapeutic exercises, Therapeutic activity, Neuromuscular re-education, Balance training, Gait training, Patient/Family education, Self Care, Joint mobilization, Stair training, Aquatic Therapy, Dry Needling, Electrical stimulation, Spinal mobilization, Cryotherapy, Moist heat, Traction, Ultrasound, and Manual  therapy  PLAN FOR NEXT SESSION: assess response to aquatics.     Kerin Perna, PTA 02/11/22 5:25 PM

## 2022-02-25 ENCOUNTER — Encounter (HOSPITAL_BASED_OUTPATIENT_CLINIC_OR_DEPARTMENT_OTHER): Payer: Self-pay | Admitting: Physical Therapy

## 2022-02-25 ENCOUNTER — Other Ambulatory Visit: Payer: Self-pay

## 2022-02-25 ENCOUNTER — Ambulatory Visit (HOSPITAL_BASED_OUTPATIENT_CLINIC_OR_DEPARTMENT_OTHER): Payer: Federal, State, Local not specified - PPO | Admitting: Physical Therapy

## 2022-02-25 DIAGNOSIS — M545 Low back pain, unspecified: Secondary | ICD-10-CM

## 2022-02-25 DIAGNOSIS — M25552 Pain in left hip: Secondary | ICD-10-CM

## 2022-02-25 DIAGNOSIS — M6281 Muscle weakness (generalized): Secondary | ICD-10-CM

## 2022-02-25 DIAGNOSIS — R262 Difficulty in walking, not elsewhere classified: Secondary | ICD-10-CM

## 2022-02-25 DIAGNOSIS — N951 Menopausal and female climacteric states: Secondary | ICD-10-CM

## 2022-02-25 MED ORDER — PAROXETINE HCL 10 MG PO TABS: 10.0000 mg | ORAL_TABLET | Freq: Every day | ORAL | 2 refills | Status: DC

## 2022-02-25 NOTE — Therapy (Signed)
OUTPATIENT PHYSICAL THERAPY LOWER EXTREMITY TREATMENT NOTE   Patient Name: Tina Hinton MRN: [id number] DOB:[birth year], 66 y.o., female Today's Date: 02/25/2022     Past Medical History:  Diagnosis Date   Ankle fracture    Right   Anxiety    Arthritis    hands, knees   Asthma    Bilateral knee pain    Chest pain    Chicken pox    Clavicle fracture    Left   Depression    Foot fracture, left    GERD (gastroesophageal reflux disease)    Hyperlipidemia    IBS (irritable bowel syndrome) 01/19/2017   Joint pain    Low back pain    Osteoarthritis    Other fatigue    Palpitations    Panic attacks    Seasonal allergies    Shortness of breath on exertion    Swallowing difficulty    Uterine polyp    ablation   Past Surgical History:  Procedure Laterality Date   BIOSPY     HEAD   CESAREAN SECTION     COLONOSCOPY     ENDOMETRIAL ABLATION     MANDIBLE SURGERY     TONSILLECTOMY     TUBAL LIGATION     UPPER GASTROINTESTINAL ENDOSCOPY     Patient Active Problem List   Diagnosis Date Noted   Vitamin D deficiency 06/25/2021   Class 3 severe obesity with serious comorbidity and body mass index (BMI) of 45.0 to 49.9 in adult ([place]) 03/12/2021   Prediabetes 03/12/2021   Lumbar strain, subsequent encounter 02/14/2021   Asthma    Vertigo 01/31/2020   Aortic atherosclerosis ([place]) 02/15/2018   IBS (irritable bowel syndrome) - D 01/19/2017   Primary osteoarthritis of both knees 01/19/2017   Preventative health care 07/30/2015   Morbid obesity ([place]) 07/30/2015   Anxiety 06/25/2015   Hyperlipidemia LDL goal <100 06/25/2015   Sprain of medial collateral ligament of left knee 06/25/2015   Osteoarthritis of both knees 06/25/2015   Degeneration of intervertebral disc of lumbar region 01/04/2015   Spondylolisthesis at L5-S1 level 01/04/2015   Fracture of lateral malleolus 11/14/2014   Foot, fracture, navicular 11/14/2014    PCP: Ann Held, DO  REFERRING  PROVIDER: Mcarthur Rossetti, MD  REFERRING DIAG: [phone] (ICD-10-CM) - Pain in left hip  THERAPY DIAG:  No diagnosis found.  Rationale for Evaluation and Treatment Rehabilitation  ONSET DATE: years  SUBJECTIVE:   SUBJECTIVE STATEMENT: Joined the [employer].  The stretches are really helping my hip feel better.   PERTINENT HISTORY: From MD notes on 01/06/22 "The patient is well-known to [place].  She has moderate arthritis in her left hip and her left knee.  She recently had an intra-articular steroid injection in her left hip under fluoroscopy by Dr. Ernestina Patches and that went very well.  When I saw her last, she was needing to ambulate with a rolling walker.  She is an active 66 year old female and is now going without the walker.  She is pain-free.  That includes her left knee as well.  She does have a new mattress from a sleeping standpoint that is really helped her back.  She is working on weight loss.  Her last BMI was 44.  She is very much interested in aquatic therapy.  PAIN:  Are you having pain? Yes: NPRS scale: 1/10 Pain location: L hip intermittently, L knee 1-2/10 intermittent Pain description: ache Aggravating factors: standing, walking Relieving factors: sitting  PRECAUTIONS: Fall  WEIGHT BEARING RESTRICTIONS No  FALLS:  Has patient fallen in last 6 months? Yes. Number of falls 0 but feels very unsteady and L knee buckles  LIVING ENVIRONMENT: Lives with: lives with their son Lives in: House/apartment Stairs: Yes: Internal: 12 steps; on right going up, on left going up, and can reach both to basement laundry Has following equipment at home: Single point cane and Walker - 4 wheeled  OCCUPATION: nurse - remote  PLOF: Independent with basic ADLs  PATIENT GOALS attend aquatic exercise for strengthening.    OBJECTIVE:   DIAGNOSTIC FINDINGS: Xray L knee 6/19/232 views of the left knee show mild to moderate arthritic changes.  There  is calcifications of both meniscus and  slight varus malalignment.  There  are osteophytes in all 3 compartments.  Hip Xray 11/18/21 IMPRESSION: Degenerative changes in the hips, left greater than right. Significant loss of central joint space in the left hip.  PATIENT SURVEYS:  LEFS 30/80  COGNITION:  Overall cognitive status: Within functional limits for tasks assessed     SENSATION: WFL  EDEMA:  NA  MUSCLE LENGTH: NT  POSTURE: rounded shoulders, forward head, and increased lumbar lordosis  PALPATION: NT  LOWER EXTREMITY ROM:   NT  LOWER EXTREMITY MMT:  MMT Right eval Left eval  Hip flexion 5 4  Hip extension 4+ 4  Hip abduction 5 4+  Hip adduction 5 5  Knee flexion 5 4+  Knee extension 5 4+  Ankle dorsiflexion 5 5  Ankle plantarflexion     (Blank rows = not tested)  LOWER EXTREMITY SPECIAL TESTS:   NT  FUNCTIONAL TESTS:  5 times sit to stand: 19.88 sec without UE assist, increased L knee pain.  6 minute walk test: 900 ft, dyspnea, increased L hip pain and trendelburg gait  GAIT: Distance walked: 900 ft Assistive device utilized: None Level of assistance: Complete Independence Comments:  increased L hip pain and trendelburg gait with increased distance.     TODAY'S TREATMENT: Pt seen for aquatic therapy today.  Treatment took place in water 3.25-4.5 ft in depth at the [facility]. Temp of water was 91.  Pt entered/exited the pool via stairs independently with bilat rail.   * warm up of walking forward/backward/ side stepping * walking forward kicks (like soccer ball) *Ue supported on yellow noodle: hip abdct/ add; hip ext; x10 * holding wall:  heel raises; squats; hip openers; stork stance with single clam (improved range on LLE); side to side lunge for adductor stretch;  hip circles cw&ccw * return to walking backward / forward without support * holding yellow noodle:  forward walking lunge *side lunge  with 1 foam hand buoy add/abd x 4 widths * straddling noodle:  cycling, scissoring, skiing  Pt requires the buoyancy and hydrostatic pressure of water for support, and to offload joints by unweighting joint load by at least 50 % in navel deep water and by at least 75-80% in chest to neck deep water.  Viscosity of the water is needed for resistance of strengthening. Water current perturbations provides challenge to standing balance requiring increased core activation.      PATIENT EDUCATION:  Education details: education on aquatic therapy Person educated: Patient Education method: Explanation Education comprehension: verbalized understanding   HOME EXERCISE PROGRAM: [id number] 02/25/22 FZ  ASSESSMENT:  CLINICAL IMPRESSION: Pt with new membership a [employer].  She is encouraged to exercise 3 x weekly for optimal benefit. Excellent response to last session reporting the aquatic therapy  has helped with the pain. Stretching seems to decrease pain mostly. Added lb stretches as pt reports back feeling tight. She executes exercises with minimal cues. No complaints of discomfort. Will begin HEP for aquatics.     OBJECTIVE IMPAIRMENTS Abnormal gait, decreased activity tolerance, decreased balance, decreased endurance, decreased mobility, difficulty walking, decreased strength, impaired perceived functional ability, increased muscle spasms, impaired flexibility, and pain.   ACTIVITY LIMITATIONS carrying, lifting, bending, standing, squatting, stairs, transfers, and locomotion level  PARTICIPATION LIMITATIONS: shopping, community activity, and occupation  [place], Time since onset of injury/illness/exacerbation, and 3+ comorbidities: chronic LBP, L knee OA, L hip OA, asthma, obesity  are also affecting patient's functional outcome.   REHAB POTENTIAL: Good  CLINICAL DECISION MAKING: Stable/uncomplicated  EVALUATION COMPLEXITY: Low   GOALS: Goals reviewed with patient? Yes  SHORT TERM GOALS: Target date: 02/25/22 Patient will tolerate  aquatic therapy without increased pain afterwards.   Baseline: had to stop previous therapy due to increased pain following exercise.  Goal status: INITIAL  LONG TERM GOALS: Target date: 03/11/22  Patient will demonstrate improved functional LE strength by improving 5x STS to < 14 seconds to decrease fall risk.  Baseline: 19.88 seconds  Goal status: INITIAL  2.  Patient will be confident with transition to community based aquatic program.  Baseline: Info provided on [employer] Goal status: INITIAL  3.  Patient will demonstrated improved function as demonstrated by LEFS >39/80.  Baseline: 30/80 Goal status: INITIAL  4.  Patient will be able to ambulate 1000' without increased L hip pain or gait deviations.  Baseline:  Goal status: INITIAL  PLAN: PT FREQUENCY: 1-2x/week  PT DURATION: 6 weeks  PLANNED INTERVENTIONS: Therapeutic exercises, Therapeutic activity, Neuromuscular re-education, Balance training, Gait training, Patient/Family education, Self Care, Joint mobilization, Stair training, Aquatic Therapy, Dry Needling, Electrical stimulation, Spinal mobilization, Cryotherapy, Moist heat, Traction, Ultrasound, and Manual therapy  PLAN FOR NEXT SESSION: assess response to aquatics.     Stanton Kidney Tharon Aquas) Kazaria Gaertner MPT 02/25/22 7:47 AM

## 2022-02-25 NOTE — Therapy (Deleted)
OUTPATIENT PHYSICAL THERAPY LOWER EXTREMITY EVALUATION   Patient Name: Tina Hinton MRN: [id number] DOB:Feb 17, 1956, 66 y.o., female Today's Date: 02/25/2022     Past Medical History:  Diagnosis Date   Ankle fracture    Right   Anxiety    Arthritis    hands, knees   Asthma    Bilateral knee pain    Chest pain    Chicken pox    Clavicle fracture    Left   Depression    Foot fracture, left    GERD (gastroesophageal reflux disease)    Hyperlipidemia    IBS (irritable bowel syndrome) 01/19/2017   Joint pain    Low back pain    Osteoarthritis    Other fatigue    Palpitations    Panic attacks    Seasonal allergies    Shortness of breath on exertion    Swallowing difficulty    Uterine polyp    ablation   Past Surgical History:  Procedure Laterality Date   BIOSPY     HEAD   CESAREAN SECTION     COLONOSCOPY     ENDOMETRIAL ABLATION     MANDIBLE SURGERY     TONSILLECTOMY     TUBAL LIGATION     UPPER GASTROINTESTINAL ENDOSCOPY     Patient Active Problem List   Diagnosis Date Noted   Vitamin D deficiency 06/25/2021   Class 3 severe obesity with serious comorbidity and body mass index (BMI) of 45.0 to 49.9 in adult ([place]) 03/12/2021   Prediabetes 03/12/2021   Lumbar strain, subsequent encounter 02/14/2021   Asthma    Vertigo 01/31/2020   Aortic atherosclerosis ([place]) 02/15/2018   IBS (irritable bowel syndrome) - D 01/19/2017   Primary osteoarthritis of both knees 01/19/2017   Preventative health care 07/30/2015   Morbid obesity ([place]) 07/30/2015   Anxiety 06/25/2015   Hyperlipidemia LDL goal <100 06/25/2015   Sprain of medial collateral ligament of left knee 06/25/2015   Osteoarthritis of both knees 06/25/2015   Degeneration of intervertebral disc of lumbar region 01/04/2015   Spondylolisthesis at L5-S1 level 01/04/2015   Fracture of lateral malleolus 11/14/2014   Foot, fracture, navicular 11/14/2014    PCP: Ann Held, DO  REFERRING  PROVIDER: Mcarthur Rossetti, MD  REFERRING DIAG: [phone] (ICD-10-CM) - Pain in left hip  THERAPY DIAG:  No diagnosis found.  Rationale for Evaluation and Treatment Rehabilitation  ONSET DATE: years  SUBJECTIVE:   SUBJECTIVE STATEMENT: Patient reports hip pain for years following accident at work, now she had arthritis in every joint, and has fallen multiple times on knees.  She tried PT before but it was too painful.  She got injections in her L hip and knee and that helped, she is interest in starting aquatic therapy now.   PERTINENT HISTORY: From MD notes on 01/06/22 "The patient is well-known to [place].  She has moderate arthritis in her left hip and her left knee.  She recently had an intra-articular steroid injection in her left hip under fluoroscopy by Dr. Ernestina Patches and that went very well.  When I saw her last, she was needing to ambulate with a rolling walker.  She is an active 66 year old female and is now going without the walker.  She is pain-free.  That includes her left knee as well.  She does have a new mattress from a sleeping standpoint that is really helped her back.  She is working on weight loss.  Her last BMI was 44.  She is very much interested in aquatic therapy.  PAIN:  Are you having pain? Yes: NPRS scale: 1-2/10 Pain location: L hip intermittently, L knee 1-2/10 intermittent Pain description: ache Aggravating factors: standing, walking Relieving factors: sitting  PRECAUTIONS: Fall  WEIGHT BEARING RESTRICTIONS No  FALLS:  Has patient fallen in last 6 months? Yes. Number of falls 0 but feels very unsteady and L knee buckles  LIVING ENVIRONMENT: Lives with: lives with their son Lives in: House/apartment Stairs: Yes: Internal: 12 steps; on right going up, on left going up, and can reach both to basement laundry Has following equipment at home: Single point cane and Walker - 4 wheeled  OCCUPATION: nurse - remote  PLOF: Independent with basic ADLs  PATIENT  GOALS attend aquatic exercise for strengthening.    OBJECTIVE:   DIAGNOSTIC FINDINGS: Xray L knee 6/19/232 views of the left knee show mild to moderate arthritic changes.  There  is calcifications of both meniscus and slight varus malalignment.  There  are osteophytes in all 3 compartments.  Hip Xray 11/18/21 IMPRESSION: Degenerative changes in the hips, left greater than right. Significant loss of central joint space in the left hip.  PATIENT SURVEYS:  LEFS 30/80  COGNITION:  Overall cognitive status: Within functional limits for tasks assessed     SENSATION: WFL  EDEMA:  NA  MUSCLE LENGTH: NT  POSTURE: rounded shoulders, forward head, and increased lumbar lordosis  PALPATION: NT  LOWER EXTREMITY ROM:   NT  LOWER EXTREMITY MMT:  MMT Right eval Left eval  Hip flexion 5 4  Hip extension 4+ 4  Hip abduction 5 4+  Hip adduction 5 5  Knee flexion 5 4+  Knee extension 5 4+  Ankle dorsiflexion 5 5  Ankle plantarflexion     (Blank rows = not tested)  LOWER EXTREMITY SPECIAL TESTS:   NT  FUNCTIONAL TESTS:  5 times sit to stand: 19.88 sec without UE assist, increased L knee pain.  6 minute walk test: 900 ft, dyspnea, increased L hip pain and trendelburg gait  GAIT: Distance walked: 900 ft Assistive device utilized: None Level of assistance: Complete Independence Comments:  increased L hip pain and trendelburg gait with increased distance.     TODAY'S TREATMENT:  01/28/22 Self Care: see patient education    PATIENT EDUCATION:  Education details: education on aquatic therapy, recommendations for community programs, what to expect, plan for future. Person educated: Patient Education method: Explanation Education comprehension: verbalized understanding   HOME EXERCISE PROGRAM: NA  ASSESSMENT:  CLINICAL IMPRESSION: Patient is a 66 y.o. female who was seen today for physical therapy evaluation and treatment for L hip pain, with recommendation for  aquatic therapy. She reports having difficulty participating in formal land based physical therapy in the past.   Her goal is to transition to community based exercise program to continue to work on LE strengthening and support weight loss.  She demonstrates impairments in gait, LE strength, and balance.  Today provided information on [employer] aquatic program that is close to home.  Due to impairments, she will start with aquatic therapy at our [place] location until confidence with starting community program.   We did discuss in the future returning to land based therapy if she felt she would be able to tolerate better after building up strength through aquatic therapy.    OBJECTIVE IMPAIRMENTS Abnormal gait, decreased activity tolerance, decreased balance, decreased endurance, decreased mobility, difficulty walking, decreased strength, impaired perceived functional ability, increased muscle spasms, impaired flexibility,  and pain.   ACTIVITY LIMITATIONS carrying, lifting, bending, standing, squatting, stairs, transfers, and locomotion level  PARTICIPATION LIMITATIONS: shopping, community activity, and occupation  [place], Time since onset of injury/illness/exacerbation, and 3+ comorbidities: chronic LBP, L knee OA, L hip OA, asthma, obesity  are also affecting patient's functional outcome.   REHAB POTENTIAL: Good  CLINICAL DECISION MAKING: Stable/uncomplicated  EVALUATION COMPLEXITY: Low   GOALS: Goals reviewed with patient? Yes  SHORT TERM GOALS: Target date: 03/25/2022  Patient will tolerate aquatic therapy without increased pain afterwards.   Baseline: had to stop previous therapy due to increased pain following exercise.  Goal status: INITIAL  LONG TERM GOALS: Target date: 04/08/2022   Patient will demonstrate improved functional LE strength by improving 5x STS to < 14 seconds to decrease fall risk.  Baseline: 19.88 seconds  Goal status: INITIAL  2.  Patient will  be confident with transition to community based aquatic program.  Baseline: Info provided on [employer] Goal status: INITIAL  3.  Patient will demonstrated improved function as demonstrated by LEFS >39/80.  Baseline: 30/80 Goal status: INITIAL  4.  Patient will be able to ambulate 1000' without increased L hip pain or gait deviations.  Baseline:  Goal status: INITIAL  PLAN: PT FREQUENCY: 1-2x/week  PT DURATION: 6 weeks  PLANNED INTERVENTIONS: Therapeutic exercises, Therapeutic activity, Neuromuscular re-education, Balance training, Gait training, Patient/Family education, Self Care, Joint mobilization, Stair training, Aquatic Therapy, Dry Needling, Electrical stimulation, Spinal mobilization, Cryotherapy, Moist heat, Traction, Ultrasound, and Manual therapy  PLAN FOR NEXT SESSION: aquatic therapy at [place].    Denton Meek, PT, MPT  02/25/2022, 7:43 AM

## 2022-02-27 ENCOUNTER — Encounter (HOSPITAL_BASED_OUTPATIENT_CLINIC_OR_DEPARTMENT_OTHER): Payer: Self-pay | Admitting: Physical Therapy

## 2022-02-27 ENCOUNTER — Ambulatory Visit (HOSPITAL_BASED_OUTPATIENT_CLINIC_OR_DEPARTMENT_OTHER): Payer: Federal, State, Local not specified - PPO | Admitting: Physical Therapy

## 2022-02-27 DIAGNOSIS — M25552 Pain in left hip: Secondary | ICD-10-CM

## 2022-02-27 DIAGNOSIS — M6281 Muscle weakness (generalized): Secondary | ICD-10-CM

## 2022-02-27 DIAGNOSIS — R262 Difficulty in walking, not elsewhere classified: Secondary | ICD-10-CM

## 2022-02-27 NOTE — Therapy (Signed)
OUTPATIENT PHYSICAL THERAPY LOWER EXTREMITY TREATMENT NOTE   Patient Name: Tina Hinton MRN: [id number] DOB:[birth year], 66 y.o., female Today's Date: 02/27/2022   PT End of Session - 02/27/22 1709     Visit Number 4    Number of Visits 10    Date for PT Re-Evaluation 03/25/22    Authorization Type BCBS    Progress Note Due on Visit 10    PT Start Time 1703    PT Stop Time 1744    PT Time Calculation (min) 41 min    Activity Tolerance Patient tolerated treatment well    Behavior During Therapy WFL for tasks assessed/performed              Past Medical History:  Diagnosis Date   Ankle fracture    Right   Anxiety    Arthritis    hands, knees   Asthma    Bilateral knee pain    Chest pain    Chicken pox    Clavicle fracture    Left   Depression    Foot fracture, left    GERD (gastroesophageal reflux disease)    Hyperlipidemia    IBS (irritable bowel syndrome) 01/19/2017   Joint pain    Low back pain    Osteoarthritis    Other fatigue    Palpitations    Panic attacks    Seasonal allergies    Shortness of breath on exertion    Swallowing difficulty    Uterine polyp    ablation   Past Surgical History:  Procedure Laterality Date   BIOSPY     HEAD   CESAREAN SECTION     COLONOSCOPY     ENDOMETRIAL ABLATION     MANDIBLE SURGERY     TONSILLECTOMY     TUBAL LIGATION     UPPER GASTROINTESTINAL ENDOSCOPY     Patient Active Problem List   Diagnosis Date Noted   Vitamin D deficiency 06/25/2021   Class 3 severe obesity with serious comorbidity and body mass index (BMI) of 45.0 to 49.9 in adult ([place]) 03/12/2021   Prediabetes 03/12/2021   Lumbar strain, subsequent encounter 02/14/2021   Asthma    Vertigo 01/31/2020   Aortic atherosclerosis ([place]) 02/15/2018   IBS (irritable bowel syndrome) - D 01/19/2017   Primary osteoarthritis of both knees 01/19/2017   Preventative health care 07/30/2015   Morbid obesity ([place]) 07/30/2015   Anxiety 06/25/2015    Hyperlipidemia LDL goal <100 06/25/2015   Sprain of medial collateral ligament of left knee 06/25/2015   Osteoarthritis of both knees 06/25/2015   Degeneration of intervertebral disc of lumbar region 01/04/2015   Spondylolisthesis at L5-S1 level 01/04/2015   Fracture of lateral malleolus 11/14/2014   Foot, fracture, navicular 11/14/2014    PCP: Ann Held, DO  REFERRING PROVIDER: Mcarthur Rossetti, MD  REFERRING DIAG: [phone] (ICD-10-CM) - Pain in left hip  THERAPY DIAG:  Pain in left hip  Muscle weakness (generalized)  Difficulty in walking, not elsewhere classified  Rationale for Evaluation and Treatment Rehabilitation  ONSET DATE: years  SUBJECTIVE:   SUBJECTIVE STATEMENT: "I really like the exercises we did last time, it was a good work out". "My back felt so good that night"   PERTINENT HISTORY: From MD notes on 01/06/22 "The patient is well-known to [place].  She has moderate arthritis in her left hip and her left knee.  She recently had an intra-articular steroid injection in her left hip under fluoroscopy by Dr. Ernestina Patches  and that went very well.  When I saw her last, she was needing to ambulate with a rolling walker.  She is an active 66 year old female and is now going without the walker.  She is pain-free.  That includes her left knee as well.  She does have a new mattress from a sleeping standpoint that is really helped her back.  She is working on weight loss.  Her last BMI was 44.  She is very much interested in aquatic therapy.  PAIN:  Are you having pain? Yes: NPRS scale: 2-3/10 Pain location: L hip , L knee "not bad" Pain description: ache Aggravating factors: standing, walking Relieving factors: sitting  PRECAUTIONS: Fall  WEIGHT BEARING RESTRICTIONS No  FALLS:  Has patient fallen in last 6 months? Yes. Number of falls 0 but feels very unsteady and L knee buckles  LIVING ENVIRONMENT: Lives with: lives with their son Lives in:  House/apartment Stairs: Yes: Internal: 12 steps; on right going up, on left going up, and can reach both to basement laundry Has following equipment at home: Single point cane and Walker - 4 wheeled  OCCUPATION: nurse - remote  PLOF: Independent with basic ADLs  PATIENT GOALS attend aquatic exercise for strengthening.    OBJECTIVE:   DIAGNOSTIC FINDINGS: Xray L knee 6/19/232 views of the left knee show mild to moderate arthritic changes.  There  is calcifications of both meniscus and slight varus malalignment.  There  are osteophytes in all 3 compartments.  Hip Xray 11/18/21 IMPRESSION: Degenerative changes in the hips, left greater than right. Significant loss of central joint space in the left hip.  PATIENT SURVEYS:  LEFS 30/80  COGNITION:  Overall cognitive status: Within functional limits for tasks assessed     SENSATION: WFL  EDEMA:  NA  MUSCLE LENGTH: NT  POSTURE: rounded shoulders, forward head, and increased lumbar lordosis  PALPATION: NT  LOWER EXTREMITY ROM:   NT  LOWER EXTREMITY MMT:  MMT Right eval Left eval  Hip flexion 5 4  Hip extension 4+ 4  Hip abduction 5 4+  Hip adduction 5 5  Knee flexion 5 4+  Knee extension 5 4+  Ankle dorsiflexion 5 5  Ankle plantarflexion     (Blank rows = not tested)  LOWER EXTREMITY SPECIAL TESTS:   NT  FUNCTIONAL TESTS:  5 times sit to stand: 19.88 sec without UE assist, increased L knee pain.  6 minute walk test: 900 ft, dyspnea, increased L hip pain and trendelburg gait  GAIT: Distance walked: 900 ft Assistive device utilized: None Level of assistance: Complete Independence Comments:  increased L hip pain and trendelburg gait with increased distance.     TODAY'S TREATMENT: Pt seen for aquatic therapy today.  Treatment took place in water 3.25-4.5 ft in depth at the [facility]. Temp of water was 92.  Pt entered/exited the pool via stairs independently with bilat rail.   * warm up of  walking forward/backward/ side stepping * side step squat with rainbow hand buoys abdct/ add  * walking forward kicks (like soccer ball) * holding wall:  heel raises; squats; hip openers; stork stance with single clam; hip abdct/ add x 5x 2; hip flex/ ext leg swings x 10;  hip circles cw&ccw * return to walking backward / forward without support * straddling noodle, holding rainbow hand buoys: cycling, scissoring, skiing * open book holding rainbow hand buoys on surface x 5 each side * holding rainbow hand buoys:  forward walking lunge *  quad stretch with thin square noodle supporting LE x 2 reps each LE;   * back against wall:  hamstring/ ITB/ adductor stretch with LE supported by thin square noodle  Pt requires the buoyancy and hydrostatic pressure of water for support, and to offload joints by unweighting joint load by at least 50 % in navel deep water and by at least 75-80% in chest to neck deep water.  Viscosity of the water is needed for resistance of strengthening. Water current perturbations provides challenge to standing balance requiring increased core activation.      PATIENT EDUCATION:  Education details: education on aquatic therapy Person educated: Patient Education method: Explanation Education comprehension: verbalized understanding   HOME EXERCISE PROGRAM: [id number] - 02/25/22 FZ  (issued laminated HEP)  ASSESSMENT:  CLINICAL IMPRESSION: Continued positive response to aquatic exercises thus far.  Pt reporting relief of symptoms during and after sessions.  She executes exercises with minimal cues. No complaints of discomfort. Will modify her laminated HEP as she progresses in sessions.  Pt has met STG1 and is progressing towards LT goals.      OBJECTIVE IMPAIRMENTS Abnormal gait, decreased activity tolerance, decreased balance, decreased endurance, decreased mobility, difficulty walking, decreased strength, impaired perceived functional ability, increased muscle  spasms, impaired flexibility, and pain.   ACTIVITY LIMITATIONS carrying, lifting, bending, standing, squatting, stairs, transfers, and locomotion level  PARTICIPATION LIMITATIONS: shopping, community activity, and occupation  [place], Time since onset of injury/illness/exacerbation, and 3+ comorbidities: chronic LBP, L knee OA, L hip OA, asthma, obesity  are also affecting patient's functional outcome.   REHAB POTENTIAL: Good  CLINICAL DECISION MAKING: Stable/uncomplicated  EVALUATION COMPLEXITY: Low   GOALS: Goals reviewed with patient? Yes  SHORT TERM GOALS: Target date: 02/25/22 Patient will tolerate aquatic therapy without increased pain afterwards.   Baseline: able to complete multiple visits without increase in pain Goal status: Achieved - 02/27/22  LONG TERM GOALS: Target date: 03/11/22  Patient will demonstrate improved functional LE strength by improving 5x STS to < 14 seconds to decrease fall risk.  Baseline: 19.88 seconds  Goal status: INITIAL  2.  Patient will be confident with transition to community based aquatic program.  Baseline: Info provided on [facility] Goal status: INITIAL  3.  Patient will demonstrated improved function as demonstrated by LEFS >39/80.  Baseline: 30/80 Goal status: INITIAL  4.  Patient will be able to ambulate 1000' without increased L hip pain or gait deviations.  Baseline:  Goal status: INITIAL  PLAN: PT FREQUENCY: 1-2x/week  PT DURATION: 6 weeks  PLANNED INTERVENTIONS: Therapeutic exercises, Therapeutic activity, Neuromuscular re-education, Balance training, Gait training, Patient/Family education, Self Care, Joint mobilization, Stair training, Aquatic Therapy, Dry Needling, Electrical stimulation, Spinal mobilization, Cryotherapy, Moist heat, Traction, Ultrasound, and Manual therapy  PLAN FOR NEXT SESSION: continue aquatics.    Kerin Perna, PTA 02/27/22 6:02 PM [facility] [address] [place], [place], [address] Phone: [phone]   Fax:  [phone]

## 2022-03-04 ENCOUNTER — Ambulatory Visit (HOSPITAL_BASED_OUTPATIENT_CLINIC_OR_DEPARTMENT_OTHER): Payer: Federal, State, Local not specified - PPO | Admitting: Physical Therapy

## 2022-03-04 ENCOUNTER — Encounter (HOSPITAL_BASED_OUTPATIENT_CLINIC_OR_DEPARTMENT_OTHER): Payer: Self-pay | Admitting: Physical Therapy

## 2022-03-04 DIAGNOSIS — M6281 Muscle weakness (generalized): Secondary | ICD-10-CM

## 2022-03-04 DIAGNOSIS — M25552 Pain in left hip: Secondary | ICD-10-CM

## 2022-03-04 DIAGNOSIS — R262 Difficulty in walking, not elsewhere classified: Secondary | ICD-10-CM

## 2022-03-04 NOTE — Therapy (Signed)
OUTPATIENT PHYSICAL THERAPY LOWER EXTREMITY TREATMENT NOTE   Patient Name: Tina Hinton MRN: [id number] DOB:December 22, 1955, 66 y.o., female Today's Date: 03/04/2022   PT End of Session - 03/04/22 1708     Visit Number 5    Number of Visits 10    Date for PT Re-Evaluation 03/25/22    Authorization Type BCBS    Progress Note Due on Visit 10    PT Start Time 1703    PT Stop Time 1745    PT Time Calculation (min) 42 min    Activity Tolerance Patient tolerated treatment well    Behavior During Therapy WFL for tasks assessed/performed           PHYSICAL THERAPY DISCHARGE SUMMARY  Visits from Start of Care: 5  Current functional level related to goals / functional outcomes: Pt is indep and safe with all ADL's and functional mobility   Remaining deficits: OA pain   Education / Equipment: HEP; Continues management of condition    Patient agrees to discharge. Patient goals were partially met. Patient is being discharged due to being pleased with the current functional level.     Past Medical History:  Diagnosis Date   Ankle fracture    Right   Anxiety    Arthritis    hands, knees   Asthma    Bilateral knee pain    Chest pain    Chicken pox    Clavicle fracture    Left   Depression    Foot fracture, left    GERD (gastroesophageal reflux disease)    Hyperlipidemia    IBS (irritable bowel syndrome) 01/19/2017   Joint pain    Low back pain    Osteoarthritis    Other fatigue    Palpitations    Panic attacks    Seasonal allergies    Shortness of breath on exertion    Swallowing difficulty    Uterine polyp    ablation   Past Surgical History:  Procedure Laterality Date   BIOSPY     HEAD   CESAREAN SECTION     COLONOSCOPY     ENDOMETRIAL ABLATION     MANDIBLE SURGERY     TONSILLECTOMY     TUBAL LIGATION     UPPER GASTROINTESTINAL ENDOSCOPY     Patient Active Problem List   Diagnosis Date Noted   Vitamin D deficiency 06/25/2021   Class 3 severe  obesity with serious comorbidity and body mass index (BMI) of 45.0 to 49.9 in adult ([place]) 03/12/2021   Prediabetes 03/12/2021   Lumbar strain, subsequent encounter 02/14/2021   Asthma    Vertigo 01/31/2020   Aortic atherosclerosis ([place]) 02/15/2018   IBS (irritable bowel syndrome) - D 01/19/2017   Primary osteoarthritis of both knees 01/19/2017   Preventative health care 07/30/2015   Morbid obesity ([place]) 07/30/2015   Anxiety 06/25/2015   Hyperlipidemia LDL goal <100 06/25/2015   Sprain of medial collateral ligament of left knee 06/25/2015   Osteoarthritis of both knees 06/25/2015   Degeneration of intervertebral disc of lumbar region 01/04/2015   Spondylolisthesis at L5-S1 level 01/04/2015   Fracture of lateral malleolus 11/14/2014   Foot, fracture, navicular 11/14/2014    PCP: Ann Held, DO  REFERRING PROVIDER: Mcarthur Rossetti, MD  REFERRING DIAG: [phone] (ICD-10-CM) - Pain in left hip  THERAPY DIAG:  Pain in left hip  Muscle weakness (generalized)  Difficulty in walking, not elsewhere classified  Rationale for Evaluation and Treatment Rehabilitation  ONSET DATE:  years  SUBJECTIVE:   SUBJECTIVE STATEMENT: "No pain today, back is better, I am so much better.  I am so glad I have found what works for me. I've been to the [facility] twice since Iv'e been here AND I enjoy it"     PERTINENT HISTORY: From MD notes on 01/06/22 "The patient is well-known to [place].  She has moderate arthritis in her left hip and her left knee.  She recently had an intra-articular steroid injection in her left hip under fluoroscopy by Dr. Ernestina Patches and that went very well.  When I saw her last, she was needing to ambulate with a rolling walker.  She is an active 66 year old female and is now going without the walker.  She is pain-free.  That includes her left knee as well.  She does have a new mattress from a sleeping standpoint that is really helped her back.  She is working on weight  loss.  Her last BMI was 44.  She is very much interested in aquatic therapy.  PAIN:  Are you having pain? Yes: NPRS scale: 0/10 Pain location: L hip , L knee "not bad" Pain description: ache Aggravating factors: standing, walking Relieving factors: sitting  PRECAUTIONS: Fall  WEIGHT BEARING RESTRICTIONS No  FALLS:  Has patient fallen in last 6 months? Yes. Number of falls 0 but feels very unsteady and L knee buckles  LIVING ENVIRONMENT: Lives with: lives with their son Lives in: House/apartment Stairs: Yes: Internal: 12 steps; on right going up, on left going up, and can reach both to basement laundry Has following equipment at home: Single point cane and Walker - 4 wheeled  OCCUPATION: nurse - remote  PLOF: Independent with basic ADLs  PATIENT GOALS attend aquatic exercise for strengthening.    OBJECTIVE:   DIAGNOSTIC FINDINGS: Xray L knee 6/19/232 views of the left knee show mild to moderate arthritic changes.  There  is calcifications of both meniscus and slight varus malalignment.  There  are osteophytes in all 3 compartments.  Hip Xray 11/18/21 IMPRESSION: Degenerative changes in the hips, left greater than right. Significant loss of central joint space in the left hip.  PATIENT SURVEYS:  LEFS 30/80   03/04/22: 37/80 COGNITION:  Overall cognitive status: Within functional limits for tasks assessed     SENSATION: WFL  EDEMA:  NA  MUSCLE LENGTH: NT  POSTURE: rounded shoulders, forward head, and increased lumbar lordosis  PALPATION: NT  LOWER EXTREMITY ROM:   NT  LOWER EXTREMITY MMT:  MMT Right eval Left eval  Hip flexion 5 4  Hip extension 4+ 4  Hip abduction 5 4+  Hip adduction 5 5  Knee flexion 5 4+  Knee extension 5 4+  Ankle dorsiflexion 5 5  Ankle plantarflexion     (Blank rows = not tested)  LOWER EXTREMITY SPECIAL TESTS:   NT  FUNCTIONAL TESTS:  5 times sit to stand: 19.88 sec without UE assist, increased L knee pain.  6 minute  walk test: 900 ft, dyspnea, increased L hip pain and trendelburg gait  GAIT: Distance walked: 900 ft Assistive device utilized: None Level of assistance: Complete Independence Comments:  increased L hip pain and trendelburg gait with increased distance.     TODAY'S TREATMENT: Pt seen for aquatic therapy today.  Treatment took place in water 3.25-4.5 ft in depth at the [facility]. Temp of water was 92.  Pt entered/exited the pool via stairs independently with bilat rail.   * warm up of  walking forward/backward/ side stepping * side step squat with rainbow hand buoys abdct/ add  * walking forward kicks (like soccer ball) * walking backward / forward yellow hand buoys submerged 8 widths * straddling noodle, holding rainbow hand buoys: cycling, scissoring, skiing * Supine suspension with yellow noodle clam shell x 10 * holding rainbow hand buoys:  forward walking lunge * thin squoodle kick down x 10 * back against wall:  hamstring/ ITB/ adductor stretch with LE supported by thin square noodle  Pt requires the buoyancy and hydrostatic pressure of water for support, and to offload joints by unweighting joint load by at least 50 % in navel deep water and by at least 75-80% in chest to neck deep water.  Viscosity of the water is needed for resistance of strengthening. Water current perturbations provides challenge to standing balance requiring increased core activation.      PATIENT EDUCATION:  Education details: education on aquatic therapy Person educated: Patient Education method: Explanation Education comprehension: verbalized understanding   HOME EXERCISE PROGRAM: [id number] - 02/25/22 FZ  (issued laminated HEP)  ASSESSMENT:  CLINICAL IMPRESSION: Pt reports compliance with aquatic HEP as well as participating in [place] [facility] aquatic classes. She states her pain has improved greatly, she has improved strength and toleration to amb. Her sleep is not  interrupted anymore due to pain. She is confident in indep management of condition and has had a significant improvement in quality of life. Most goals have been met. She is requesting DC.      OBJECTIVE IMPAIRMENTS Abnormal gait, decreased activity tolerance, decreased balance, decreased endurance, decreased mobility, difficulty walking, decreased strength, impaired perceived functional ability, increased muscle spasms, impaired flexibility, and pain.   ACTIVITY LIMITATIONS carrying, lifting, bending, standing, squatting, stairs, transfers, and locomotion level  PARTICIPATION LIMITATIONS: shopping, community activity, and occupation  [place], Time since onset of injury/illness/exacerbation, and 3+ comorbidities: chronic LBP, L knee OA, L hip OA, asthma, obesity  are also affecting patient's functional outcome.   REHAB POTENTIAL: Good  CLINICAL DECISION MAKING: Stable/uncomplicated  EVALUATION COMPLEXITY: Low   GOALS: Goals reviewed with patient? Yes  SHORT TERM GOALS: Target date: 02/25/22 Patient will tolerate aquatic therapy without increased pain afterwards.   Baseline: able to complete multiple visits without increase in pain Goal status: Achieved - 02/27/22  LONG TERM GOALS: Target date: 03/11/22  Patient will demonstrate improved functional LE strength by improving 5x STS to < 14 seconds to decrease fall risk.  Baseline: 19.88 seconds  Goal status: INITIAL  2.  Patient will be confident with transition to community based aquatic program.  Baseline: Info provided on [facility] Goal status: Achieved  3.  Patient will demonstrated improved function as demonstrated by LEFS >39/80.  Baseline: 30/80 Goal status: not met 37/80  4.  Patient will be able to ambulate 1000' without increased L hip pain or gait deviations.  Baseline:  Goal status: Not met due to early DC. She is however amb 500 ft meeting this goal  PLAN: PT FREQUENCY: 1-2x/week  PT DURATION: 6  weeks  PLANNED INTERVENTIONS: Therapeutic exercises, Therapeutic activity, Neuromuscular re-education, Balance training, Gait training, Patient/Family education, Self Care, Joint mobilization, Stair training, Aquatic Therapy, Dry Needling, Electrical stimulation, Spinal mobilization, Cryotherapy, Moist heat, Traction, Ultrasound, and Manual therapy  PLAN FOR NEXT SESSION: continue aquatics.    [address] [place]) Leeasia Secrist MPT 03/04/22 6:01 PM [facility] [address] [place], [place], [address] Phone: [phone]   Fax:  [phone]

## 2022-03-05 ENCOUNTER — Other Ambulatory Visit (INDEPENDENT_AMBULATORY_CARE_PROVIDER_SITE_OTHER): Payer: Self-pay | Admitting: Family Medicine

## 2022-03-05 DIAGNOSIS — E559 Vitamin D deficiency, unspecified: Secondary | ICD-10-CM

## 2022-03-05 DIAGNOSIS — E7849 Other hyperlipidemia: Secondary | ICD-10-CM

## 2022-03-06 ENCOUNTER — Ambulatory Visit (HOSPITAL_BASED_OUTPATIENT_CLINIC_OR_DEPARTMENT_OTHER): Payer: Self-pay | Admitting: Physical Therapy

## 2022-03-10 ENCOUNTER — Encounter: Payer: Self-pay | Admitting: Family Medicine

## 2022-03-10 ENCOUNTER — Ambulatory Visit: Payer: Federal, State, Local not specified - PPO | Admitting: Family Medicine

## 2022-03-10 VITALS — BP 120/90 | HR 87 | Temp 97.8°F | Resp 18 | Ht 61.0 in | Wt 241.8 lb

## 2022-03-10 DIAGNOSIS — E7849 Other hyperlipidemia: Secondary | ICD-10-CM | POA: Diagnosis not present

## 2022-03-10 DIAGNOSIS — E785 Hyperlipidemia, unspecified: Secondary | ICD-10-CM | POA: Diagnosis not present

## 2022-03-10 DIAGNOSIS — E559 Vitamin D deficiency, unspecified: Secondary | ICD-10-CM | POA: Diagnosis not present

## 2022-03-10 DIAGNOSIS — R7303 Prediabetes: Secondary | ICD-10-CM

## 2022-03-10 DIAGNOSIS — J4520 Mild intermittent asthma, uncomplicated: Secondary | ICD-10-CM

## 2022-03-10 DIAGNOSIS — M170 Bilateral primary osteoarthritis of knee: Secondary | ICD-10-CM

## 2022-03-10 DIAGNOSIS — M5136 Other intervertebral disc degeneration, lumbar region: Secondary | ICD-10-CM

## 2022-03-10 LAB — CBC WITH DIFFERENTIAL/PLATELET
Basophils Absolute: 0.1 10*3/uL (ref 0.0–0.1)
Basophils Relative: 1.1 % (ref 0.0–3.0)
Eosinophils Absolute: 0.2 10*3/uL (ref 0.0–0.7)
Eosinophils Relative: 2.9 % (ref 0.0–5.0)
HCT: 40.5 % (ref 36.0–46.0)
Hemoglobin: 13.2 g/dL (ref 12.0–15.0)
Lymphocytes Relative: 34.8 % (ref 12.0–46.0)
Lymphs Abs: 2 10*3/uL (ref 0.7–4.0)
MCHC: 32.7 g/dL (ref 30.0–36.0)
MCV: 90 fl (ref 78.0–100.0)
Monocytes Absolute: 0.5 10*3/uL (ref 0.1–1.0)
Monocytes Relative: 9.4 % (ref 3.0–12.0)
Neutro Abs: 3 10*3/uL (ref 1.4–7.7)
Neutrophils Relative %: 51.8 % (ref 43.0–77.0)
Platelets: 255 10*3/uL (ref 150.0–400.0)
RBC: 4.5 Mil/uL (ref 3.87–5.11)
RDW: 14.4 % (ref 11.5–15.5)
WBC: 5.7 10*3/uL (ref 4.0–10.5)

## 2022-03-10 LAB — COMPREHENSIVE METABOLIC PANEL
ALT: 17 U/L (ref 0–35)
AST: 21 U/L (ref 0–37)
Albumin: 4.4 g/dL (ref 3.5–5.2)
Alkaline Phosphatase: 60 U/L (ref 39–117)
BUN: 25 mg/dL — ABNORMAL HIGH (ref 6–23)
CO2: 29 mEq/L (ref 19–32)
Calcium: 9.7 mg/dL (ref 8.4–10.5)
Chloride: 103 mEq/L (ref 96–112)
Creatinine, Ser: 0.81 mg/dL (ref 0.40–1.20)
GFR: 75.49 mL/min (ref >60.00)
Glucose, Bld: 92 mg/dL (ref 70–99)
Potassium: 5.2 mEq/L — ABNORMAL HIGH (ref 3.5–5.1)
Sodium: 140 mEq/L (ref 135–145)
Total Bilirubin: 0.3 mg/dL (ref 0.2–1.2)
Total Protein: 7 g/dL (ref 6.0–8.3)

## 2022-03-10 LAB — VITAMIN D 25 HYDROXY (VIT D DEFICIENCY, FRACTURES): VITD: 36.87 ng/mL (ref 30.00–100.00)

## 2022-03-10 LAB — LIPID PANEL
Cholesterol: 159 mg/dL (ref 0–200)
HDL: 50.1 mg/dL (ref >39.00)
LDL Cholesterol: 90 mg/dL (ref 0–99)
NonHDL: 108.88
Total CHOL/HDL Ratio: 3
Triglycerides: 92 mg/dL (ref 0.0–149.0)
VLDL: 18.4 mg/dL (ref 0.0–40.0)

## 2022-03-10 LAB — HEMOGLOBIN A1C: Hgb A1c MFr Bld: 6.2 % (ref 4.6–6.5)

## 2022-03-10 MED ORDER — FENOFIBRATE 160 MG PO TABS: 160.0000 mg | ORAL_TABLET | Freq: Every day | ORAL | 0 refills | Status: DC

## 2022-03-10 NOTE — Assessment & Plan Note (Signed)
Doing well with exercise and diet

## 2022-03-10 NOTE — Assessment & Plan Note (Signed)
Will need a partial knee replacement when she loses weight per ortho

## 2022-03-10 NOTE — Assessment & Plan Note (Signed)
Stable

## 2022-03-10 NOTE — Patient Instructions (Signed)
Cholesterol Content in Foods ?Cholesterol is a waxy, fat-like substance that helps to carry fat in the blood. The body needs cholesterol in small amounts, but too much cholesterol can cause damage to the arteries and heart. ?What foods have cholesterol? ? ?Cholesterol is found in animal-based foods, such as meat, seafood, and dairy. Generally, low-fat dairy and lean meats have less cholesterol than full-fat dairy and fatty meats. The milligrams of cholesterol per serving (mg per serving) of common cholesterol-containing foods are listed below. ?Meats and other proteins ?Egg -- one large whole egg has 186 mg. ?Veal shank -- 4 oz (113 g) has 141 mg. ?Lean ground turkey (93% lean) -- 4 oz (113 g) has 118 mg. ?Fat-trimmed lamb loin -- 4 oz (113 g) has 106 mg. ?Lean ground beef (90% lean) -- 4 oz (113 g) has 100 mg. ?Lobster -- 3.5 oz (99 g) has 90 mg. ?Pork loin chops -- 4 oz (113 g) has 86 mg. ?Canned salmon -- 3.5 oz (99 g) has 83 mg. ?Fat-trimmed beef top loin -- 4 oz (113 g) has 78 mg. ?Frankfurter -- 1 frank (3.5 oz or 99 g) has 77 mg. ?Crab -- 3.5 oz (99 g) has 71 mg. ?Roasted chicken without skin, white meat -- 4 oz (113 g) has 66 mg. ?Light bologna -- 2 oz (57 g) has 45 mg. ?Deli-cut turkey -- 2 oz (57 g) has 31 mg. ?Canned tuna -- 3.5 oz (99 g) has 31 mg. ?Bacon -- 1 oz (28 g) has 29 mg. ?Oysters and mussels (raw) -- 3.5 oz (99 g) has 25 mg. ?Mackerel -- 1 oz (28 g) has 22 mg. ?Trout -- 1 oz (28 g) has 20 mg. ?Pork sausage -- 1 link (1 oz or 28 g) has 17 mg. ?Salmon -- 1 oz (28 g) has 16 mg. ?Tilapia -- 1 oz (28 g) has 14 mg. ?Dairy ?Soft-serve ice cream -- ? cup (4 oz or 86 g) has 103 mg. ?Whole-milk yogurt -- 1 cup (8 oz or 245 g) has 29 mg. ?Cheddar cheese -- 1 oz (28 g) has 28 mg. ?American cheese -- 1 oz (28 g) has 28 mg. ?Whole milk -- 1 cup (8 oz or 250 mL) has 23 mg. ?2% milk -- 1 cup (8 oz or 250 mL) has 18 mg. ?Cream cheese -- 1 tablespoon (Tbsp) (14.5 g) has 15 mg. ?Cottage cheese -- ? cup (4 oz or  113 g) has 14 mg. ?Low-fat (1%) milk -- 1 cup (8 oz or 250 mL) has 10 mg. ?Sour cream -- 1 Tbsp (12 g) has 8.5 mg. ?Low-fat yogurt -- 1 cup (8 oz or 245 g) has 8 mg. ?Nonfat Greek yogurt -- 1 cup (8 oz or 228 g) has 7 mg. ?Half-and-half cream -- 1 Tbsp (15 mL) has 5 mg. ?Fats and oils ?Cod liver oil -- 1 tablespoon (Tbsp) (13.6 g) has 82 mg. ?Butter -- 1 Tbsp (14 g) has 15 mg. ?Lard -- 1 Tbsp (12.8 g) has 14 mg. ?Bacon grease -- 1 Tbsp (12.9 g) has 14 mg. ?Mayonnaise -- 1 Tbsp (13.8 g) has 5-10 mg. ?Margarine -- 1 Tbsp (14 g) has 3-10 mg. ?The items listed above may not be a complete list of foods with cholesterol. Exact amounts of cholesterol in these foods may vary depending on specific ingredients and brands. Contact a dietitian for more information. ?What foods do not have cholesterol? ?Most plant-based foods do not have cholesterol unless you combine them with a food that has   cholesterol. Foods without cholesterol include: ?Grains and cereals. ?Vegetables. ?Fruits. ?Vegetable oils, such as olive, canola, and sunflower oil. ?Legumes, such as peas, beans, and lentils. ?Nuts and seeds. ?Egg whites. ?The items listed above may not be a complete list of foods that do not have cholesterol. Contact a dietitian for more information. ?Summary ?The body needs cholesterol in small amounts, but too much cholesterol can cause damage to the arteries and heart. ?Cholesterol is found in animal-based foods, such as meat, seafood, and dairy. Generally, low-fat dairy and lean meats have less cholesterol than full-fat dairy and fatty meats. ?This information is not intended to replace advice given to you by your health care provider. Make sure you discuss any questions you have with your health care provider. ?Document Revised: 10/05/2020 Document Reviewed: 10/05/2020 ?Elsevier Patient Education ? 2023 [employer] ? ?

## 2022-03-10 NOTE — Progress Notes (Signed)
Subjective:   By signing my name below, I, [name], attest that this documentation has been prepared under the direction and in the presence of Ann Held DO 03/10/2022      Patient ID: Tina Hinton, female    DOB: [birth year], 66 y.o.   MRN: [id number]  No chief complaint on file.   HPI Patient is in today for an office visit  She is requesting a letter to allow her to travel for up to 1 week per month for a 6 month trial. She is also requesting for the letter to allow her to use her cane while she travels.  She reports that she plans to undergo a partial knee replacement by Dr. Ninfa Linden. She was given a knee injection to get started with aquatic therapy. She reports positive experiences. She does aquatic therapy (bicycling in the water) 5 days a week for 45 minutes. She also stretches. She reports that since starting exercising, her waist size decreased by an inch and a half and she is currently losing weight. She is also sleeping better. She reports that in order to undergo the surgery, she has to lose weight. She is not going to [facility]. Wt Readings from Last 3 Encounters:  03/10/22 241 lb 12.8 oz (109.7 kg)  01/08/22 241 lb (109.3 kg)  11/25/21 236 lb (107 kg)   She reports that she received an influenza and RSV vaccine.   Past Medical History:  Diagnosis Date   Ankle fracture    Right   Anxiety    Arthritis    hands, knees   Asthma    Bilateral knee pain    Chest pain    Chicken pox    Clavicle fracture    Left   Depression    Foot fracture, left    GERD (gastroesophageal reflux disease)    Hyperlipidemia    IBS (irritable bowel syndrome) 01/19/2017   Joint pain    Low back pain    Osteoarthritis    Other fatigue    Palpitations    Panic attacks    Seasonal allergies    Shortness of breath on exertion    Swallowing difficulty    Uterine polyp    ablation    Past Surgical History:  Procedure Laterality Date    BIOSPY     HEAD   CESAREAN SECTION     COLONOSCOPY     ENDOMETRIAL ABLATION     MANDIBLE SURGERY     TONSILLECTOMY     TUBAL LIGATION     UPPER GASTROINTESTINAL ENDOSCOPY      Family History  Problem Relation Age of Onset   Obesity Mother    Cancer Mother    Arthritis Mother    Breast cancer Mother 77   Cancer Sister    Arthritis Maternal Grandmother    Heart disease Maternal Grandmother    Diabetes Maternal Grandmother    Breast cancer Paternal Grandmother    Prostate cancer Paternal Grandfather    Cancer Maternal Aunt    Breast cancer Maternal Aunt    Obesity Other    Colon cancer Neg Hx    Esophageal cancer Neg Hx    Rectal cancer Neg Hx    Stomach cancer Neg Hx     Social History   Socioeconomic History   Marital status: Divorced    Spouse name: Not on file   Number of children: Not on file   Years of education: Not on  file   Highest education level: Not on file  Occupational History   Occupation: Surveyor, quantity: [employer]  Tobacco Use   Smoking status: Never   Smokeless tobacco: Never  Vaping Use   Vaping Use: Never used  Substance and Sexual Activity   Alcohol use: Yes    Alcohol/week: 0.0 standard drinks of alcohol    Comment: occ   Drug use: No   Sexual activity: Not Currently  Other Topics Concern   Not on file  Social History Narrative   No regular exercise.   Social Determinants of Health   Financial Resource Strain: Not on file  Food Insecurity: Not on file  Transportation Needs: Not on file  Physical Activity: Not on file  Stress: Not on file  Social Connections: Not on file  Intimate Partner Violence: Not on file    Outpatient Medications Prior to Visit  Medication Sig Dispense Refill   albuterol (VENTOLIN HFA) 108 (90 Base) MCG/ACT inhaler TAKE 2 PUFFS BY MOUTH 4 TIMES A DAY AS NEEDED 6.7 g 2   aspirin 81 MG chewable tablet Chew 81 mg by mouth daily.     beclomethasone (QVAR REDIHALER) 40 MCG/ACT inhaler  Inhale 2 puffs into the lungs 2 (two) times daily. 1 each 2   cyclobenzaprine (FLEXERIL) 10 MG tablet Take 10 mg by mouth 2 (two) times daily as needed for muscle spasms.     diclofenac (VOLTAREN) 75 MG EC tablet Take 1 tablet (75 mg total) by mouth 2 (two) times daily. 180 tablet 1   diclofenac sodium (VOLTAREN) 1 % GEL Apply 2 g topically 4 (four) times daily as needed.     dicyclomine (BENTYL) 20 MG tablet 40 mg qid prn 60 tablet 1   doxycycline (VIBRA-TABS) 100 MG tablet Take 1 tablet (100 mg total) by mouth 2 (two) times daily. 20 tablet 0   fenofibrate 160 MG tablet Take 1 tablet (160 mg total) by mouth daily. 90 tablet 0   lansoprazole (PREVACID) 30 MG capsule Take 30 mg by mouth in the morning and at bedtime.     Loratadine (CLARITIN PO) Take 10 mg by mouth daily. Reported on 10/11/2015     meclizine (ANTIVERT) 25 MG tablet Take by mouth.     metFORMIN (GLUCOPHAGE) 500 MG tablet 1 po qd with lunch, 1 po q dinner 60 tablet 0   ondansetron (ZOFRAN) 4 MG tablet Take 1 tablet (4 mg total) by mouth every 8 (eight) hours as needed for nausea or vomiting. 20 tablet 0   PARoxetine (PAXIL) 10 MG tablet Take 1 tablet (10 mg total) by mouth daily. 90 tablet 2   rosuvastatin (CRESTOR) 20 MG tablet TAKE 1 TABLET BY MOUTH EVERYDAY AT BEDTIME 90 tablet 1   traMADol (ULTRAM) 50 MG tablet Take 2 tablets (100 mg total) by mouth every 6 (six) hours as needed. 30 tablet 0   traZODone (DESYREL) 50 MG tablet 1-2 q hs 60 tablet 0   triamcinolone cream (KENALOG) 0.1 % APPLY TO AFFECTED AREA TWICE A DAY 30 g 0   Vitamin D, Ergocalciferol, (DRISDOL) 1.25 MG (50000 UNIT) CAPS capsule Take 1 capsule (50,000 Units total) by mouth every 7 (seven) days. 12 capsule 0   No facility-administered medications prior to visit.    Allergies  Allergen Reactions   Other Rash    entexla   Phenylephrine-Guaifenesin Rash    ROS     Objective:    Physical Exam Constitutional:  General: She is not in acute  distress.    Appearance: Normal appearance. She is not ill-appearing.  HENT:     Head: Normocephalic and atraumatic.     Right Ear: External ear normal.     Left Ear: External ear normal.  Eyes:     Extraocular Movements: Extraocular movements intact.     Pupils: Pupils are equal, round, and reactive to light.  Cardiovascular:     Rate and Rhythm: Normal rate and regular rhythm.     Heart sounds: Normal heart sounds. No murmur heard.    No gallop.  Pulmonary:     Effort: Pulmonary effort is normal. No respiratory distress.     Breath sounds: Normal breath sounds. No wheezing or rales.  Skin:    General: Skin is warm and dry.  Neurological:     Mental Status: She is alert.  Psychiatric:        Judgment: Judgment normal.     There were no vitals taken for this visit. Wt Readings from Last 3 Encounters:  01/08/22 241 lb (109.3 kg)  11/25/21 236 lb (107 kg)  11/18/21 238 lb 6.4 oz (108.1 kg)    Diabetic Foot Exam - Simple   No data filed    Lab Results  Component Value Date   WBC 8.4 11/18/2021   HGB 12.6 11/18/2021   HCT 38.4 11/18/2021   PLT 261.0 11/18/2021   GLUCOSE 95 11/18/2021   CHOL 174 11/18/2021   TRIG 166.0 (H) 11/18/2021   HDL 48.30 11/18/2021   LDLDIRECT 155.0 06/25/2015   LDLCALC 92 11/18/2021   ALT 24 11/18/2021   AST 24 11/18/2021   NA 140 11/18/2021   K 4.8 11/18/2021   CL 103 11/18/2021   CREATININE 0.92 11/18/2021   BUN 28 (H) 11/18/2021   CO2 29 11/18/2021   TSH 1.42 11/16/2020   HGBA1C 6.0 11/18/2021   MICROALBUR 0.8 11/16/2020    Lab Results  Component Value Date   TSH 1.42 11/16/2020   Lab Results  Component Value Date   WBC 8.4 11/18/2021   HGB 12.6 11/18/2021   HCT 38.4 11/18/2021   MCV 90.2 11/18/2021   PLT 261.0 11/18/2021   Lab Results  Component Value Date   NA 140 11/18/2021   K 4.8 11/18/2021   CO2 29 11/18/2021   GLUCOSE 95 11/18/2021   BUN 28 (H) 11/18/2021   CREATININE 0.92 11/18/2021   BILITOT 0.3  11/18/2021   ALKPHOS 71 11/18/2021   AST 24 11/18/2021   ALT 24 11/18/2021   PROT 6.8 11/18/2021   ALBUMIN 4.3 11/18/2021   CALCIUM 9.6 11/18/2021   EGFR 84 05/14/2021   GFR 64.93 11/18/2021   Lab Results  Component Value Date   CHOL 174 11/18/2021   Lab Results  Component Value Date   HDL 48.30 11/18/2021   Lab Results  Component Value Date   LDLCALC 92 11/18/2021   Lab Results  Component Value Date   TRIG 166.0 (H) 11/18/2021   Lab Results  Component Value Date   CHOLHDL 4 11/18/2021   Lab Results  Component Value Date   HGBA1C 6.0 11/18/2021       Assessment & Plan:   Problem List Items Addressed This Visit   None  No orders of the defined types were placed in this encounter.   I, [name], personally preformed the services described in this documentation.  All medical record entries made by the scribe were at my direction and in  my presence.  I have reviewed the chart and discharge instructions (if applicable) and agree that the record reflects my personal performance and is accurate and complete. 03/10/2022   I,Amber Collins,acting as a scribe for Ann Held, DO.,have documented all relevant documentation on the behalf of Ann Held, DO,as directed by  Ann Held, DO while in the presence of Ann Held, DO.    [employer]

## 2022-03-10 NOTE — Assessment & Plan Note (Signed)
Encourage heart healthy diet such as MIND or DASH diet, increase exercise, avoid trans fats, simple carbohydrates and processed foods, consider a krill or fish or flaxseed oil cap daily.  °

## 2022-03-11 ENCOUNTER — Ambulatory Visit (HOSPITAL_BASED_OUTPATIENT_CLINIC_OR_DEPARTMENT_OTHER): Payer: Federal, State, Local not specified - PPO | Admitting: Physical Therapy

## 2022-03-11 ENCOUNTER — Ambulatory Visit: Payer: Federal, State, Local not specified - PPO | Admitting: Physical Therapy

## 2022-03-13 ENCOUNTER — Ambulatory Visit (HOSPITAL_BASED_OUTPATIENT_CLINIC_OR_DEPARTMENT_OTHER): Payer: Self-pay | Admitting: Physical Therapy

## 2022-03-18 ENCOUNTER — Ambulatory Visit (HOSPITAL_BASED_OUTPATIENT_CLINIC_OR_DEPARTMENT_OTHER): Payer: Self-pay | Admitting: Physical Therapy

## 2022-03-20 ENCOUNTER — Ambulatory Visit (HOSPITAL_BASED_OUTPATIENT_CLINIC_OR_DEPARTMENT_OTHER): Payer: Self-pay | Admitting: Physical Therapy

## 2022-04-14 ENCOUNTER — Telehealth: Payer: Self-pay

## 2022-04-14 NOTE — Patient Outreach (Signed)
  Care Coordination   04/14/2022 Name: Druanne Bosques MRN: [id number] DOB: [birth year]   Care Coordination Outreach Attempts:  An unsuccessful telephone outreach was attempted today to offer the patient information about available care coordination services as a benefit of their health plan.   Follow Up Plan:  Additional outreach attempts will be made to offer the patient care coordination information and services.   Encounter Outcome:  No Answer  Care Coordination Interventions Activated:  No   Care Coordination Interventions:  No, not indicated    Enzo Montgomery, RN,BSN,CCM [facility] Telephonic Care Management Coordinator Direct Phone: [phone] Toll Free: [phone] Fax: [phone]

## 2022-04-22 ENCOUNTER — Telehealth: Payer: Self-pay

## 2022-04-22 NOTE — Patient Outreach (Signed)
  Care Coordination   Initial Visit Note   04/22/2022 Name: Tina Hinton MRN: [id number] DOB: Sep 07, 1955  Tina Hinton is a 66 y.o. year old female who sees Carollee Herter, Alferd Apa, DO for primary care. I spoke with  Brunetta Genera by phone today.  What matters to the patients health and wellness today?  Patient voices that her sister takes Ozempic and has good results in wgt loss and wondering if she should/could try it. Advised patient to discus gt loss options including meds with MD. She voices that she has been told in order to have ortho surgery she will ned to lose some wgt first. Patient interested in some wgt loss clinic in her area and provided with info for her to follow up on. She denies any further RN CM/THN needs or concerns at this time.     Goals Addressed             This Visit's Progress    COMPLETED: Care Coordination-no follow up required       Care Coordination Interventions: Advised patient to discuss wgt loss options with MD Provided education to patient re: [facility] services Assessed social determinant of health barriers Assessed for care gaps: AWV completed on 01/08/22. Patient states she recently got flu and RSV vaccine. She plans to get COVID booster and she is up to date on PNA vaccine.           SDOH assessments and interventions completed:  Yes  SDOH Interventions Today    Flowsheet Row Most Recent Value  SDOH Interventions   Food Insecurity Interventions Intervention Not Indicated  Transportation Interventions Intervention Not Indicated        Care Coordination Interventions Activated:  Yes  Care Coordination Interventions:  Yes, provided   Follow up plan: No further intervention required.   Encounter Outcome:  Pt. Visit Completed   Enzo Montgomery, RN,BSN,CCM [facility] Telephonic Care Management Coordinator Direct Phone: [phone] Toll Free: [phone] Fax: [phone]

## 2022-04-28 DIAGNOSIS — R002 Palpitations: Secondary | ICD-10-CM | POA: Diagnosis not present

## 2022-04-28 DIAGNOSIS — I479 Paroxysmal tachycardia, unspecified: Secondary | ICD-10-CM | POA: Diagnosis not present

## 2022-04-28 DIAGNOSIS — R0602 Shortness of breath: Secondary | ICD-10-CM | POA: Diagnosis not present

## 2022-04-28 DIAGNOSIS — R0689 Other abnormalities of breathing: Secondary | ICD-10-CM | POA: Diagnosis not present

## 2022-04-28 DIAGNOSIS — R42 Dizziness and giddiness: Secondary | ICD-10-CM | POA: Diagnosis not present

## 2022-04-29 DIAGNOSIS — R002 Palpitations: Secondary | ICD-10-CM | POA: Diagnosis not present

## 2022-04-29 DIAGNOSIS — R000 Tachycardia, unspecified: Secondary | ICD-10-CM | POA: Diagnosis not present

## 2022-05-12 ENCOUNTER — Encounter: Payer: Self-pay | Admitting: Family Medicine

## 2022-05-12 DIAGNOSIS — E7849 Other hyperlipidemia: Secondary | ICD-10-CM

## 2022-05-13 MED ORDER — FENOFIBRATE 160 MG PO TABS: 160.0000 mg | ORAL_TABLET | Freq: Every day | ORAL | 0 refills | Status: DC

## 2022-06-04 ENCOUNTER — Other Ambulatory Visit: Payer: Self-pay | Admitting: Family Medicine

## 2022-06-04 DIAGNOSIS — M170 Bilateral primary osteoarthritis of knee: Secondary | ICD-10-CM

## 2022-07-23 DIAGNOSIS — H5203 Hypermetropia, bilateral: Secondary | ICD-10-CM | POA: Diagnosis not present

## 2022-07-23 DIAGNOSIS — E119 Type 2 diabetes mellitus without complications: Secondary | ICD-10-CM | POA: Diagnosis not present

## 2022-07-23 LAB — HM DIABETES EYE EXAM

## 2022-07-24 ENCOUNTER — Encounter: Payer: Self-pay | Admitting: Family Medicine

## 2022-07-24 ENCOUNTER — Ambulatory Visit: Payer: Federal, State, Local not specified - PPO | Admitting: Family Medicine

## 2022-07-24 VITALS — BP 110/84 | HR 74 | Temp 98.3°F | Resp 18 | Ht 61.0 in | Wt 245.6 lb

## 2022-07-24 DIAGNOSIS — E7849 Other hyperlipidemia: Secondary | ICD-10-CM | POA: Diagnosis not present

## 2022-07-24 DIAGNOSIS — R7303 Prediabetes: Secondary | ICD-10-CM

## 2022-07-24 DIAGNOSIS — J4540 Moderate persistent asthma, uncomplicated: Secondary | ICD-10-CM

## 2022-07-24 DIAGNOSIS — E785 Hyperlipidemia, unspecified: Secondary | ICD-10-CM | POA: Diagnosis not present

## 2022-07-24 DIAGNOSIS — Z6841 Body Mass Index (BMI) 40.0 and over, adult: Secondary | ICD-10-CM

## 2022-07-24 DIAGNOSIS — E559 Vitamin D deficiency, unspecified: Secondary | ICD-10-CM

## 2022-07-24 DIAGNOSIS — R790 Abnormal level of blood mineral: Secondary | ICD-10-CM | POA: Diagnosis not present

## 2022-07-24 DIAGNOSIS — I471 Supraventricular tachycardia, unspecified: Secondary | ICD-10-CM | POA: Diagnosis not present

## 2022-07-24 LAB — LIPID PANEL
Cholesterol: 244 mg/dL — ABNORMAL HIGH (ref 0–200)
HDL: 47.4 mg/dL (ref >39.00)
NonHDL: 196.66
Total CHOL/HDL Ratio: 5
Triglycerides: 201 mg/dL — ABNORMAL HIGH (ref 0.0–149.0)
VLDL: 40.2 mg/dL — ABNORMAL HIGH (ref 0.0–40.0)

## 2022-07-24 LAB — CBC WITH DIFFERENTIAL/PLATELET
Basophils Absolute: 0 10*3/uL (ref 0.0–0.1)
Basophils Relative: 0.6 % (ref 0.0–3.0)
Eosinophils Absolute: 0.1 10*3/uL (ref 0.0–0.7)
Eosinophils Relative: 2.4 % (ref 0.0–5.0)
HCT: 40.5 % (ref 36.0–46.0)
Hemoglobin: 13.3 g/dL (ref 12.0–15.0)
Lymphocytes Relative: 31.1 % (ref 12.0–46.0)
Lymphs Abs: 1.9 10*3/uL (ref 0.7–4.0)
MCHC: 32.8 g/dL (ref 30.0–36.0)
MCV: 89.3 fl (ref 78.0–100.0)
Monocytes Absolute: 0.6 10*3/uL (ref 0.1–1.0)
Monocytes Relative: 9.6 % (ref 3.0–12.0)
Neutro Abs: 3.4 10*3/uL (ref 1.4–7.7)
Neutrophils Relative %: 56.3 % (ref 43.0–77.0)
Platelets: 232 10*3/uL (ref 150.0–400.0)
RBC: 4.54 Mil/uL (ref 3.87–5.11)
RDW: 14.7 % (ref 11.5–15.5)
WBC: 6 10*3/uL (ref 4.0–10.5)

## 2022-07-24 LAB — COMPREHENSIVE METABOLIC PANEL
ALT: 23 U/L (ref 0–35)
AST: 23 U/L (ref 0–37)
Albumin: 4.1 g/dL (ref 3.5–5.2)
Alkaline Phosphatase: 71 U/L (ref 39–117)
BUN: 15 mg/dL (ref 6–23)
CO2: 30 mEq/L (ref 19–32)
Calcium: 9.6 mg/dL (ref 8.4–10.5)
Chloride: 102 mEq/L (ref 96–112)
Creatinine, Ser: 0.78 mg/dL (ref 0.40–1.20)
GFR: 78.78 mL/min (ref >60.00)
Glucose, Bld: 86 mg/dL (ref 70–99)
Potassium: 4.9 mEq/L (ref 3.5–5.1)
Sodium: 141 mEq/L (ref 135–145)
Total Bilirubin: 0.4 mg/dL (ref 0.2–1.2)
Total Protein: 6.8 g/dL (ref 6.0–8.3)

## 2022-07-24 LAB — HEMOGLOBIN A1C: Hgb A1c MFr Bld: 6 % (ref 4.6–6.5)

## 2022-07-24 LAB — LDL CHOLESTEROL, DIRECT: Direct LDL: 155 mg/dL

## 2022-07-24 LAB — VITAMIN D 25 HYDROXY (VIT D DEFICIENCY, FRACTURES): VITD: 17.43 ng/mL — ABNORMAL LOW (ref 30.00–100.00)

## 2022-07-24 LAB — MAGNESIUM: Magnesium: 1.7 mg/dL (ref 1.5–2.5)

## 2022-07-24 LAB — TSH: TSH: 3.7 u[IU]/mL (ref 0.35–5.50)

## 2022-07-24 MED ORDER — QVAR REDIHALER 40 MCG/ACT IN AERB: 2.0000 | INHALATION_SPRAY | Freq: Two times a day (BID) | RESPIRATORY_TRACT | 3 refills | Status: AC

## 2022-07-24 NOTE — Assessment & Plan Note (Signed)
Encourage heart healthy diet such as MIND or DASH diet, increase exercise, avoid trans fats, simple carbohydrates and processed foods, consider a krill or fish or flaxseed oil cap daily.  °

## 2022-07-24 NOTE — Progress Notes (Signed)
Subjective:   By signing my name below, I, Shehryar Baig, attest that this documentation has been prepared under the direction and in the presence of Ann Held, DO. 07/24/2022   Patient ID: Tina Hinton, female    DOB: 05/28/1956, 67 y.o.   MRN: [id number]  Chief Complaint  Patient presents with   Hyperlipidemia   Asthma   Follow-up    Hyperlipidemia Pertinent negatives include no chest pain or shortness of breath.  Asthma There is no shortness of breath. Pertinent negatives include no chest pain, fever, headaches or malaise/fatigue. Her past medical history is significant for asthma.   Patient is in today for a follow up visit.   She is requesting a refill for her Qvar inhaler. Her breathing is stable while using it.  She was admitted to he ED on 04/28/2022 for Paroxysmal SVT. She reports waking up with shortness of breath and was then taken to the ED. She reports receiving an adenosine injections while there. She is UTD on vision care.  She is planning on making an appointment for receiving hearing aides. She has moderate to severe hearing loss in both ears.  She is trying to go to a healthy weight and wellness clinic.    Past Medical History:  Diagnosis Date   Ankle fracture    Right   Anxiety    Arthritis    hands, knees   Asthma    Bilateral knee pain    Chest pain    Chicken pox    Clavicle fracture    Left   Depression    Foot fracture, left    GERD (gastroesophageal reflux disease)    Hyperlipidemia    IBS (irritable bowel syndrome) 01/19/2017   Joint pain    Low back pain    Osteoarthritis    Other fatigue    Palpitations    Panic attacks    Seasonal allergies    Shortness of breath on exertion    Swallowing difficulty    Uterine polyp    ablation    Past Surgical History:  Procedure Laterality Date   BIOSPY     HEAD   CESAREAN SECTION     COLONOSCOPY     ENDOMETRIAL ABLATION     MANDIBLE SURGERY     TONSILLECTOMY      TUBAL LIGATION     UPPER GASTROINTESTINAL ENDOSCOPY      Family History  Problem Relation Age of Onset   Obesity Mother    Cancer Mother    Arthritis Mother    Breast cancer Mother 72   Cancer Sister    Arthritis Maternal Grandmother    Heart disease Maternal Grandmother    Diabetes Maternal Grandmother    Breast cancer Paternal Grandmother    Prostate cancer Paternal Grandfather    Cancer Maternal Aunt    Breast cancer Maternal Aunt    Obesity Other    Colon cancer Neg Hx    Esophageal cancer Neg Hx    Rectal cancer Neg Hx    Stomach cancer Neg Hx     Social History   Socioeconomic History   Marital status: Divorced    Spouse name: Not on file   Number of children: Not on file   Years of education: Not on file   Highest education level: Not on file  Occupational History   Occupation: Surveyor, quantity: [employer]  Tobacco Use   Smoking status: Never   Smokeless  tobacco: Never  Vaping Use   Vaping Use: Never used  Substance and Sexual Activity   Alcohol use: Yes    Alcohol/week: 0.0 standard drinks of alcohol    Comment: occ   Drug use: No   Sexual activity: Not Currently  Other Topics Concern   Not on file  Social History Narrative   No regular exercise.   Social Determinants of Health   Financial Resource Strain: Not on file  Food Insecurity: No Food Insecurity (04/22/2022)   Hunger Vital Sign    Worried About Running Out of Food in the Last Year: Never true    Ran Out of Food in the Last Year: Never true  Transportation Needs: No Transportation Needs (04/22/2022)   PRAPARE - Hydrologist (Medical): No    Lack of Transportation (Non-Medical): No  Physical Activity: Not on file  Stress: Not on file  Social Connections: Not on file  Intimate Partner Violence: Not on file    Outpatient Medications Prior to Visit  Medication Sig Dispense Refill   diclofenac sodium (VOLTAREN) 1 % GEL Apply 2 g topically 4  (four) times daily as needed.     fenofibrate 160 MG tablet Take 1 tablet (160 mg total) by mouth daily. 90 tablet 0   ondansetron (ZOFRAN) 4 MG tablet Take 1 tablet (4 mg total) by mouth every 8 (eight) hours as needed for nausea or vomiting. 20 tablet 0   traZODone (DESYREL) 50 MG tablet 1-2 q hs 60 tablet 0   triamcinolone cream (KENALOG) 0.1 % APPLY TO AFFECTED AREA TWICE A DAY 30 g 0   Vitamin D, Ergocalciferol, (DRISDOL) 1.25 MG (50000 UNIT) CAPS capsule Take 1 capsule (50,000 Units total) by mouth every 7 (seven) days. 12 capsule 0   albuterol (VENTOLIN HFA) 108 (90 Base) MCG/ACT inhaler TAKE 2 PUFFS BY MOUTH 4 TIMES A DAY AS NEEDED 6.7 g 2   aspirin 81 MG chewable tablet Chew 81 mg by mouth daily.     beclomethasone (QVAR REDIHALER) 40 MCG/ACT inhaler Inhale 2 puffs into the lungs 2 (two) times daily. 1 each 2   diclofenac (VOLTAREN) 75 MG EC tablet TAKE 1 TABLET BY MOUTH TWICE A DAY 180 tablet 1   dicyclomine (BENTYL) 20 MG tablet 40 mg qid prn 60 tablet 1   lansoprazole (PREVACID) 30 MG capsule Take 30 mg by mouth in the morning and at bedtime.     Loratadine (CLARITIN PO) Take 10 mg by mouth daily. Reported on 10/11/2015     meclizine (ANTIVERT) 25 MG tablet Take by mouth.     metFORMIN (GLUCOPHAGE) 500 MG tablet 1 po qd with lunch, 1 po q dinner 60 tablet 0   PARoxetine (PAXIL) 10 MG tablet Take 1 tablet (10 mg total) by mouth daily. 90 tablet 2   rosuvastatin (CRESTOR) 20 MG tablet TAKE 1 TABLET BY MOUTH EVERYDAY AT BEDTIME 90 tablet 1   traMADol (ULTRAM) 50 MG tablet Take 2 tablets (100 mg total) by mouth every 6 (six) hours as needed. 30 tablet 0   cyclobenzaprine (FLEXERIL) 10 MG tablet Take 10 mg by mouth 2 (two) times daily as needed for muscle spasms. (Patient not taking: Reported on 07/24/2022)     No facility-administered medications prior to visit.    Allergies  Allergen Reactions   Other Rash    entexla   Phenylephrine-Guaifenesin Rash    Review of Systems   Constitutional:  Negative for fever and malaise/fatigue.  HENT:  Negative for congestion.   Eyes:  Negative for blurred vision.  Respiratory:  Negative for shortness of breath.   Cardiovascular:  Negative for chest pain, palpitations and leg swelling.  Gastrointestinal:  Negative for abdominal pain, blood in stool and nausea.  Genitourinary:  Negative for dysuria and frequency.  Musculoskeletal:  Negative for falls.  Skin:  Negative for rash.  Neurological:  Negative for dizziness, loss of consciousness and headaches.  Endo/Heme/Allergies:  Negative for environmental allergies.  Psychiatric/Behavioral:  Negative for depression. The patient is not nervous/anxious.        Objective:    Physical Exam Vitals and nursing note reviewed.  Constitutional:      General: She is not in acute distress.    Appearance: Normal appearance. She is well-developed. She is not ill-appearing.  HENT:     Head: Normocephalic and atraumatic.     Right Ear: External ear normal.     Left Ear: External ear normal.  Eyes:     Extraocular Movements: Extraocular movements intact.     Conjunctiva/sclera: Conjunctivae normal.     Pupils: Pupils are equal, round, and reactive to light.  Neck:     Thyroid: No thyromegaly.     Vascular: No carotid bruit or JVD.  Cardiovascular:     Rate and Rhythm: Normal rate and regular rhythm.     Heart sounds: Normal heart sounds. No murmur heard.    No gallop.  Pulmonary:     Effort: Pulmonary effort is normal. No respiratory distress.     Breath sounds: Normal breath sounds. No wheezing or rales.  Chest:     Chest wall: No tenderness.  Musculoskeletal:     Cervical back: Normal range of motion and neck supple.  Skin:    General: Skin is warm and dry.  Neurological:     Mental Status: She is alert and oriented to person, place, and time.  Psychiatric:        Judgment: Judgment normal.     BP 110/84 (BP Location: Left Arm, Patient Position: Sitting, Cuff  Size: Large)   Pulse 74   Temp 98.3 F (36.8 C) (Oral)   Resp 18   Ht 5' 1"$  (1.549 m)   Wt 245 lb 9.6 oz (111.4 kg)   SpO2 94%   BMI 46.41 kg/m  Wt Readings from Last 3 Encounters:  07/24/22 245 lb 9.6 oz (111.4 kg)  03/10/22 241 lb 12.8 oz (109.7 kg)  01/08/22 241 lb (109.3 kg)       Assessment & Plan:  SVT (supraventricular tachycardia) -     CBC with Differential/Platelet -     Comprehensive metabolic panel -     Lipid panel -     TSH -     Ambulatory referral to Cardiology  Moderate persistent asthma, unspecified whether complicated Assessment & Plan: Stable  Refill qvar   Orders: -     Qvar RediHaler; Inhale 2 puffs into the lungs 2 (two) times daily.  Dispense: 3 each; Refill: 3  Other hyperlipidemia  Vitamin D deficiency  Hyperlipidemia LDL goal <100 Assessment & Plan: Encourage heart healthy diet such as MIND or DASH diet, increase exercise, avoid trans fats, simple carbohydrates and processed foods, consider a krill or fish or flaxseed oil cap daily.    Orders: -     CBC with Differential/Platelet -     Comprehensive metabolic panel -     Lipid panel -     TSH  Low magnesium  level -     Magnesium  Prediabetes -     Hemoglobin A1c  Class 3 severe obesity with serious comorbidity and body mass index (BMI) of 45.0 to 49.9 in adult, unspecified obesity type (HCC) -     VITAMIN D 25 Hydroxy (Vit-D Deficiency, Fractures)  Morbid obesity ([place]) Assessment & Plan: Pt is wanting to take wegovy but she needs to be in a weight loss program for 3 months She will call [facility] to get back in      I, Ann Held, DO, personally preformed the services described in this documentation.  All medical record entries made by the scribe were at my direction and in my presence.  I have reviewed the chart and discharge instructions (if applicable) and agree that the record reflects my personal performance and is accurate and complete. 07/24/2022   I,Shehryar  Baig,acting as a scribe for Ann Held, DO.,have documented all relevant documentation on the behalf of Ann Held, DO,as directed by  Ann Held, DO while in the presence of Ann Held, DO.   Ann Held, DO

## 2022-07-24 NOTE — Patient Instructions (Signed)

## 2022-07-24 NOTE — Assessment & Plan Note (Signed)
Stable  Refill qvar

## 2022-07-24 NOTE — Assessment & Plan Note (Signed)
Pt is wanting to take wegovy but she needs to be in a weight loss program for 3 months She will call [facility] to get back in

## 2022-07-31 ENCOUNTER — Other Ambulatory Visit: Payer: Self-pay

## 2022-07-31 MED ORDER — ROSUVASTATIN CALCIUM 10 MG PO TABS: 10.0000 mg | ORAL_TABLET | Freq: Every day | ORAL | 3 refills | Status: DC

## 2022-09-10 ENCOUNTER — Other Ambulatory Visit: Payer: Self-pay | Admitting: Family Medicine

## 2022-09-10 DIAGNOSIS — E785 Hyperlipidemia, unspecified: Secondary | ICD-10-CM

## 2022-09-16 ENCOUNTER — Ambulatory Visit: Payer: Federal, State, Local not specified - PPO | Admitting: Cardiology

## 2022-09-22 ENCOUNTER — Encounter: Payer: Self-pay | Admitting: *Deleted

## 2022-10-03 ENCOUNTER — Ambulatory Visit: Payer: Federal, State, Local not specified - PPO | Admitting: Cardiovascular Disease

## 2022-10-06 ENCOUNTER — Ambulatory Visit: Payer: Federal, State, Local not specified - PPO | Admitting: Cardiovascular Disease

## 2022-10-07 NOTE — Progress Notes (Signed)
Referring-Tina Zola Button, DO Reason for referral-SVT  HPI: 67 year old female for evaluation of SVT at request of Seabron Spates, DO.  Abdominal ultrasound February 2021 showed no aneurysm.  Patient was seen at [facility] health November 2023 after she awoke from sleep with palpitations, dyspnea and chest tightness.  By report her heart rate was 230 at time of EMS arrival and electrocardiogram showed SVT.  She was given adenosine which broke her SVT.  Rhythm strips are not available for review.  Cardiology now asked to evaluate.  Patient states she has had no recurrent episodes since then although she does occasionally feel brief flutters as if the rhythm will begin but it does not.  She otherwise has some dyspnea on exertion but no orthopnea, PND, pedal edema, exertional chest pain or syncope.  Current Outpatient Medications  Medication Sig Dispense Refill   aspirin 81 MG chewable tablet Chew 81 mg by mouth daily.     beclomethasone (QVAR REDIHALER) 40 MCG/ACT inhaler Inhale 2 puffs into the lungs 2 (two) times daily. 3 each 3   diclofenac sodium (VOLTAREN) 1 % GEL Apply 2 g topically 4 (four) times daily as needed.     fenofibrate 160 MG tablet Take 1 tablet (160 mg total) by mouth daily. 90 tablet 0   lansoprazole (PREVACID) 15 MG capsule Take 30 mg by mouth 2 (two) times daily.     loratadine (CLARITIN) 10 MG tablet Take 10 mg by mouth daily.     ondansetron (ZOFRAN) 4 MG tablet Take 1 tablet (4 mg total) by mouth every 8 (eight) hours as needed for nausea or vomiting. 20 tablet 0   PARoxetine (PAXIL) 10 MG tablet Take 10 mg by mouth daily.     rosuvastatin (CRESTOR) 10 MG tablet Take 1 tablet (10 mg total) by mouth daily. 90 tablet 3   traZODone (DESYREL) 50 MG tablet 1-2 q hs 60 tablet 0   triamcinolone cream (KENALOG) 0.1 % APPLY TO AFFECTED AREA TWICE A DAY 30 g 0   No current facility-administered medications for this visit.    Allergies  Allergen Reactions   Other Rash     entexla   Phenylephrine-Guaifenesin Rash     Past Medical History:  Diagnosis Date   Ankle fracture    Right   Anxiety    Arthritis    hands, knees   Asthma    Bilateral knee pain    Chest pain    Chicken pox    Clavicle fracture    Left   Depression    Foot fracture, left    GERD (gastroesophageal reflux disease)    Hyperlipidemia    IBS (irritable bowel syndrome) 01/19/2017   Joint pain    Low back pain    Osteoarthritis    Other fatigue    Palpitations    Panic attacks    Seasonal allergies    Shortness of breath on exertion    SVT (supraventricular tachycardia)    Swallowing difficulty    Uterine polyp    ablation    Past Surgical History:  Procedure Laterality Date   BIOSPY     HEAD   CESAREAN SECTION     COLONOSCOPY     ENDOMETRIAL ABLATION     MANDIBLE SURGERY     TONSILLECTOMY     TUBAL LIGATION     UPPER GASTROINTESTINAL ENDOSCOPY      Social History   Socioeconomic History   Marital status: Divorced  Spouse name: Not on file   Number of children: 3   Years of education: Not on file   Highest education level: Not on file  Occupational History   Occupation: Copywriter, advertising: [employer]  Tobacco Use   Smoking status: Never   Smokeless tobacco: Never  Vaping Use   Vaping Use: Never used  Substance and Sexual Activity   Alcohol use: Yes    Comment: occasional   Drug use: No   Sexual activity: Not Currently  Other Topics Concern   Not on file  Social History Narrative   No regular exercise.   Social Determinants of Health   Financial Resource Strain: Not on file  Food Insecurity: No Food Insecurity (04/22/2022)   Hunger Vital Sign    Worried About Running Out of Food in the Last Year: Never true    Ran Out of Food in the Last Year: Never true  Transportation Needs: No Transportation Needs (04/22/2022)   PRAPARE - Administrator, Civil Service (Medical): No    Lack of Transportation  (Non-Medical): No  Physical Activity: Not on file  Stress: Not on file  Social Connections: Not on file  Intimate Partner Violence: Not on file    Family History  Problem Relation Age of Onset   Obesity Mother    Cancer Mother    Arthritis Mother    Breast cancer Mother 70   Cancer Sister    Arthritis Maternal Grandmother    Heart disease Maternal Grandmother    Diabetes Maternal Grandmother    Breast cancer Paternal Grandmother    Prostate cancer Paternal Grandfather    Cancer Maternal Aunt    Breast cancer Maternal Aunt    Obesity Other    Colon cancer Neg Hx    Esophageal cancer Neg Hx    Rectal cancer Neg Hx    Stomach cancer Neg Hx     ROS: Hip and knee arthralgias but no fevers or chills, productive cough, hemoptysis, dysphasia, odynophagia, melena, hematochezia, dysuria, hematuria, rash, seizure activity, orthopnea, PND, pedal edema, claudication. Remaining systems are negative.  Physical Exam:   Blood pressure (!) 166/68, pulse 73, height 5\' 1"  (1.549 m), weight 245 lb 3.2 oz (111.2 kg).  General:  Well developed/well nourished in NAD Skin warm/dry Patient not depressed No peripheral clubbing Back-normal HEENT-normal/normal eyelids Neck supple/normal carotid upstroke bilaterally; no bruits; no JVD; no thyromegaly chest - CTA/ normal expansion CV - RRR/normal S1 and S2; no murmurs, rubs or gallops;  PMI nondisplaced Abdomen -NT/ND, no HSM, no mass, + bowel sounds, no bruit 2+ femoral pulses, no bruits Ext-no edema, chords, 2+ DP Neuro-grossly nonfocal  ECG -normal sinus rhythm at a rate of 73, no ST changes.  Personally reviewed  A/P  1 SVT-I do not have rhythm strips to document but patient had SVT that was terminated with adenosine.  Since that time she has occasional brief flutters but has not had recurrent prolonged SVT.  Will arrange echocardiogram to assess LV function.  I will add Toprol 25 mg nightly.  If she has more frequent episodes in the future  we will refer for ablation.  2 hyperlipidemia-continue Crestor and fenofibrate.  3 elevated blood pressure reading-patient's blood pressure is elevated today but she follows this and it is typically controlled.  Will adjust regimen in the future if necessary.  Olga Millers, MD

## 2022-10-15 ENCOUNTER — Ambulatory Visit: Payer: Federal, State, Local not specified - PPO | Attending: Cardiology | Admitting: Cardiology

## 2022-10-15 ENCOUNTER — Encounter: Payer: Self-pay | Admitting: Cardiology

## 2022-10-15 VITALS — BP 166/68 | HR 73 | Ht 61.0 in | Wt 245.2 lb

## 2022-10-15 DIAGNOSIS — E785 Hyperlipidemia, unspecified: Secondary | ICD-10-CM

## 2022-10-15 DIAGNOSIS — I471 Supraventricular tachycardia, unspecified: Secondary | ICD-10-CM

## 2022-10-15 MED ORDER — METOPROLOL SUCCINATE ER 25 MG PO TB24: 25.0000 mg | ORAL_TABLET | Freq: Every day | ORAL | 3 refills | Status: DC

## 2022-10-15 NOTE — Patient Instructions (Signed)
Medication Instructions:   START METOPROLOL SUCC ER 25 MG ONCE DAILY AT BEDTIME  *If you need a refill on your cardiac medications before your next appointment, please call your pharmacy*   Testing/Procedures:  Your physician has requested that you have an echocardiogram. Echocardiography is a painless test that uses sound waves to create images of your heart. It provides your doctor with information about the size and shape of your heart and how well your heart's chambers and valves are working. This procedure takes approximately one hour. There are no restrictions for this procedure. Please do NOT wear cologne, perfume, aftershave, or lotions (deodorant is allowed). Please arrive 15 minutes prior to your appointment time. [facility]   Follow-Up: At [facility], you and your health needs are our priority.  As part of our continuing mission to provide you with exceptional heart care, we have created designated Provider Care Teams.  These Care Teams include your primary Cardiologist (physician) and Advanced Practice Providers (APPs -  Physician Assistants and Nurse Practitioners) who all work together to provide you with the care you need, when you need it.  We recommend signing up for the patient portal called "MyChart".  Sign up information is provided on this After Visit Summary.  MyChart is used to connect with patients for Virtual Visits (Telemedicine).  Patients are able to view lab/test results, encounter notes, upcoming appointments, etc.  Non-urgent messages can be sent to your provider as well.   To learn more about what you can do with MyChart, go to [email or web address].    Your next appointment:   6 month(s)  Provider:   Olga Millers MD [place]

## 2022-10-28 DIAGNOSIS — S339XXA Sprain of unspecified parts of lumbar spine and pelvis, initial encounter: Secondary | ICD-10-CM | POA: Diagnosis not present

## 2022-11-11 IMAGING — CR DG HIP (WITH OR WITHOUT PELVIS) 2-3V*L*
3 series · 3 of 3 positions shown · non-contrast
Comparison: None Available.

CLINICAL DATA: Pain.

EXAM:
DG HIP (WITH OR WITHOUT PELVIS) 2-3V LEFT

[t pelvis a.p.]
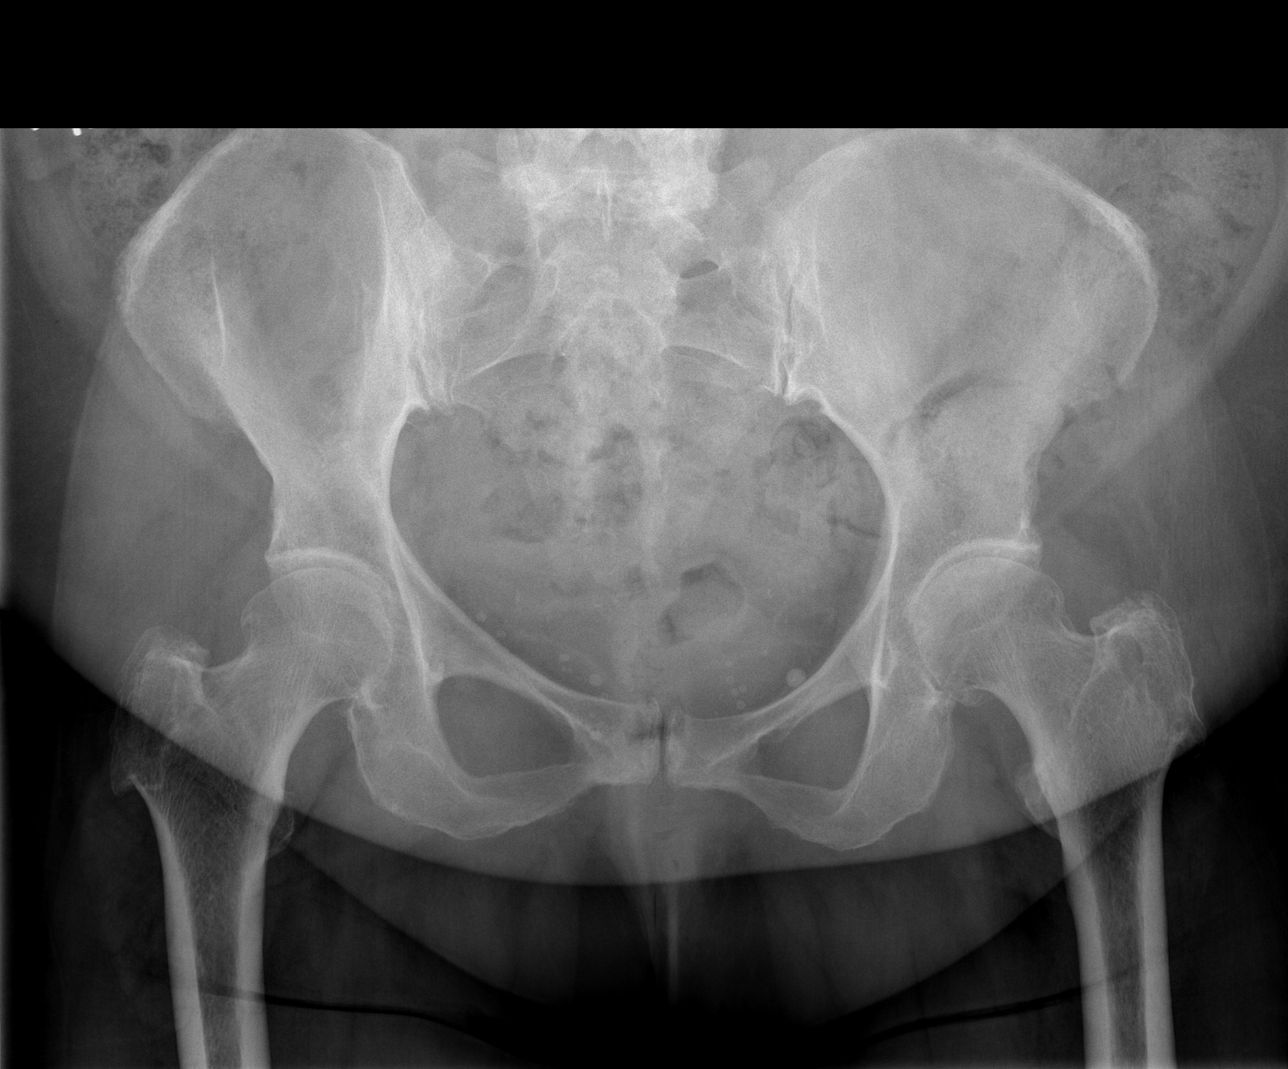

[t hip ap left]
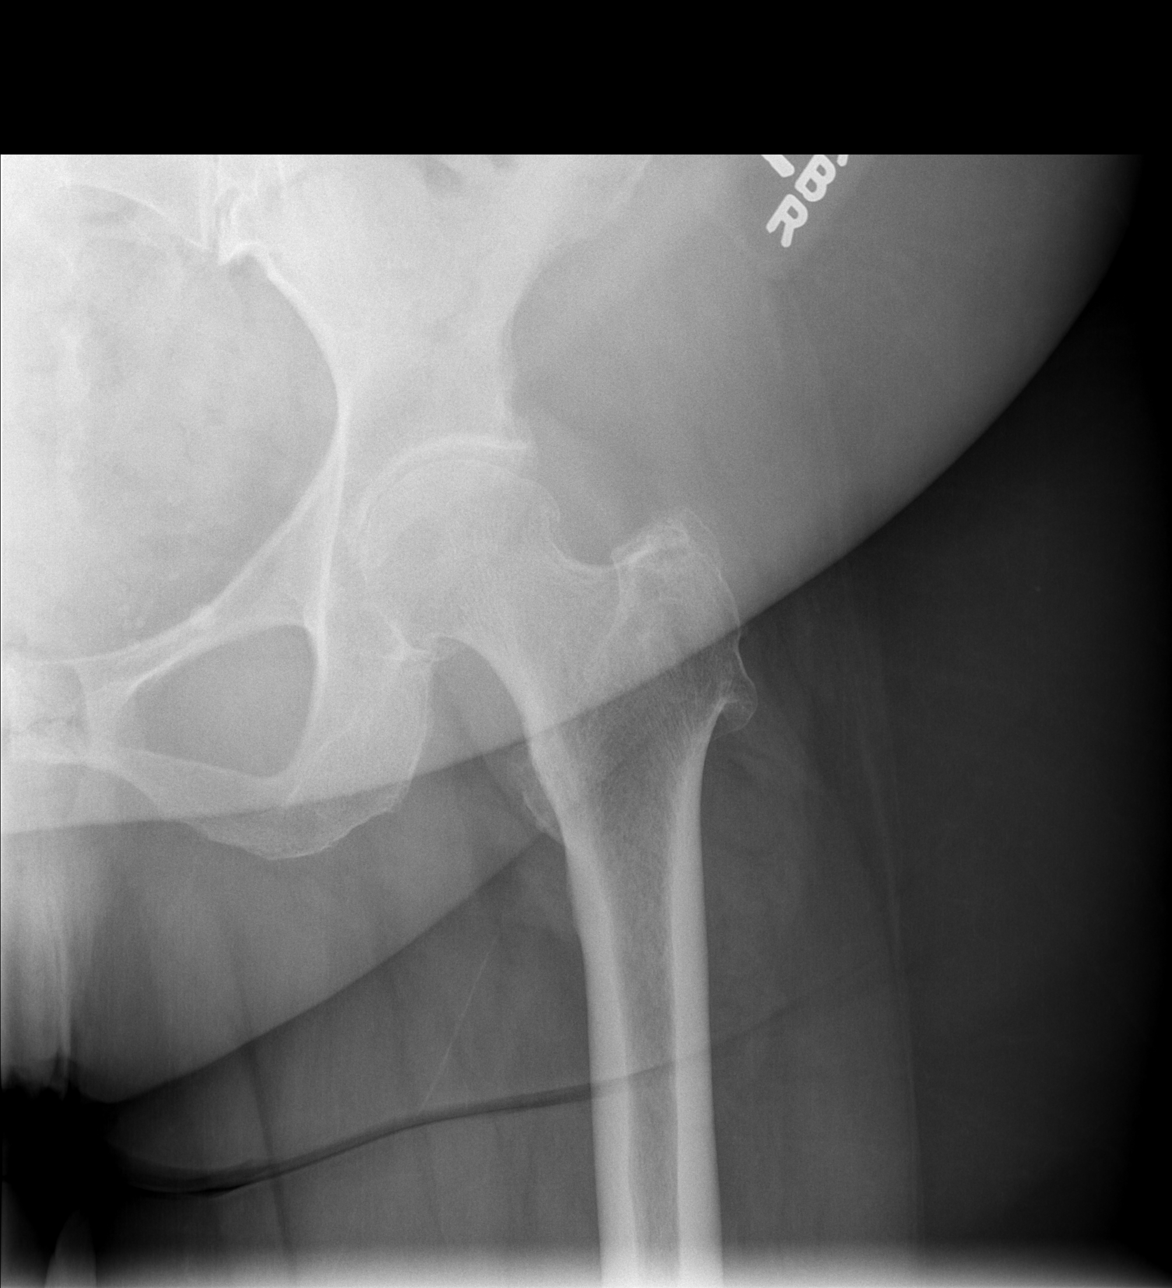

[t hip frog leg left]
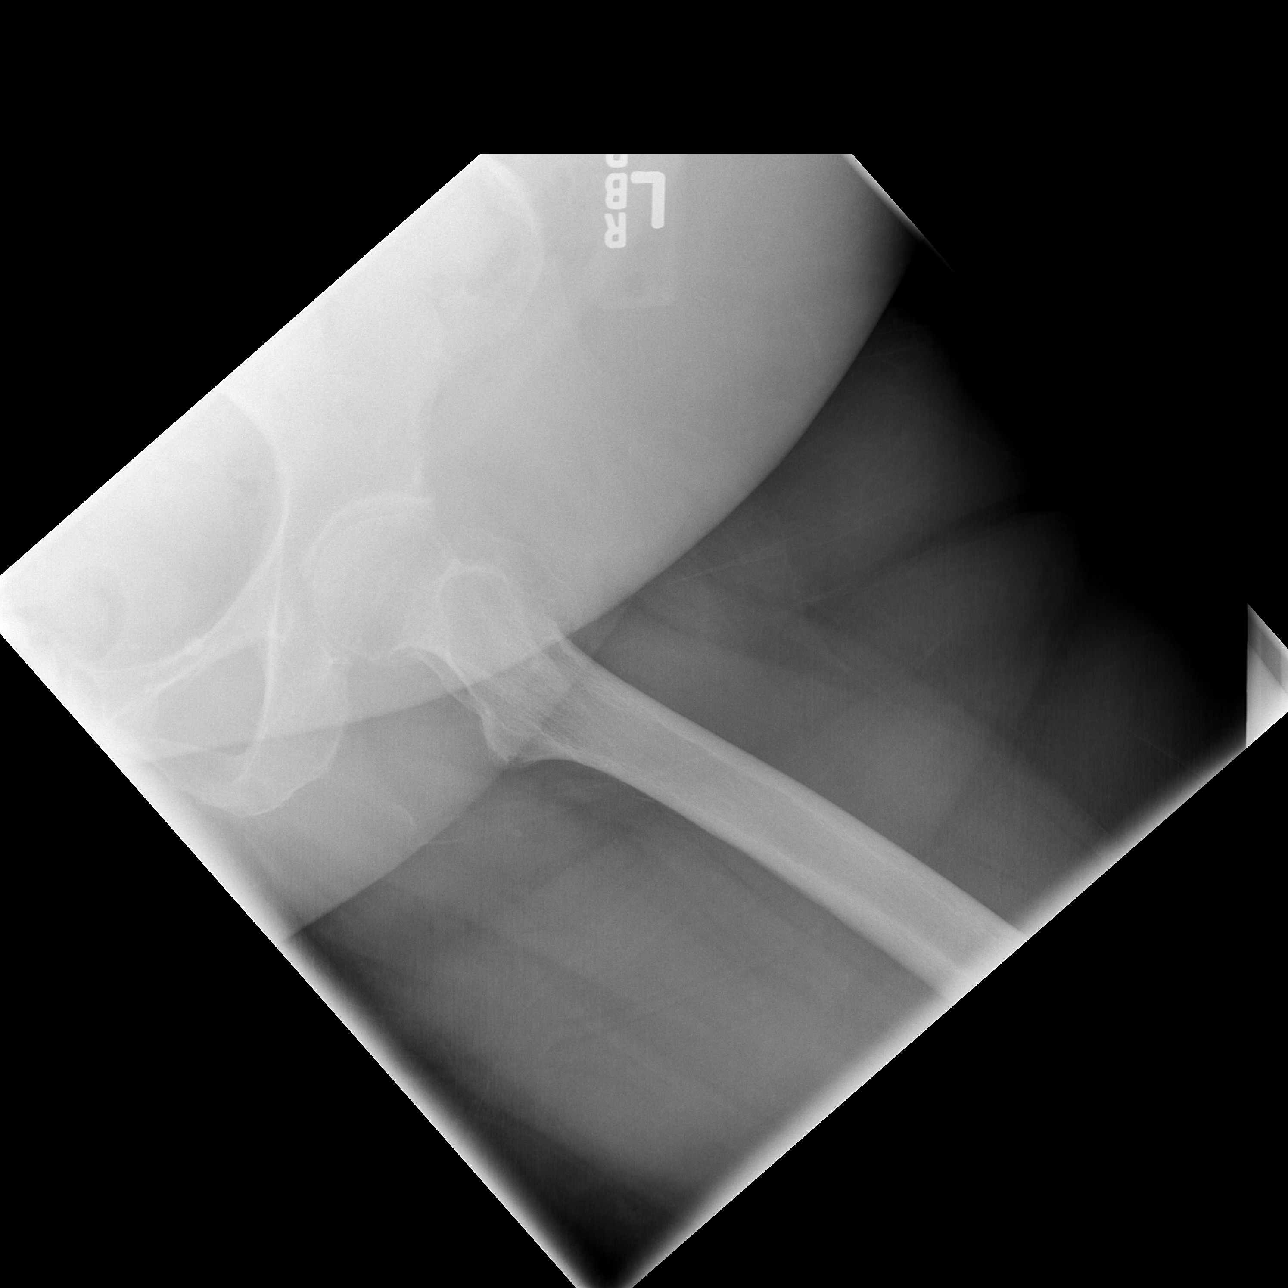

[3 of 3 positions shown; findings below may reference images not displayed]

FINDINGS: Degenerative changes are identified in the hips, left greater than
right. There is significant loss of central joint space on the left.
No fractures or dislocations.

Degenerative changes in the lower lumbar spine.
IMPRESSION: Degenerative changes in the hips, left greater than right.
Significant loss of central joint space in the left hip.

## 2022-11-12 ENCOUNTER — Ambulatory Visit (HOSPITAL_BASED_OUTPATIENT_CLINIC_OR_DEPARTMENT_OTHER)
Admission: RE | Admit: 2022-11-12 | Discharge: 2022-11-12 | Disposition: A | Discharge status: Home / Self Care | Payer: Federal, State, Local not specified - PPO | Source: Ambulatory Visit | Attending: Cardiology | Admitting: Cardiology

## 2022-11-12 DIAGNOSIS — I471 Supraventricular tachycardia, unspecified: Secondary | ICD-10-CM | POA: Insufficient documentation

## 2022-11-12 LAB — ECHOCARDIOGRAM COMPLETE
Area-P 1/2: 4.1 cm2
S' Lateral: 2.4 cm

## 2022-12-02 DIAGNOSIS — L820 Inflamed seborrheic keratosis: Secondary | ICD-10-CM | POA: Diagnosis not present

## 2022-12-02 DIAGNOSIS — L814 Other melanin hyperpigmentation: Secondary | ICD-10-CM | POA: Diagnosis not present

## 2022-12-02 DIAGNOSIS — L821 Other seborrheic keratosis: Secondary | ICD-10-CM | POA: Diagnosis not present

## 2022-12-02 DIAGNOSIS — Z08 Encounter for follow-up examination after completed treatment for malignant neoplasm: Secondary | ICD-10-CM | POA: Diagnosis not present

## 2022-12-02 DIAGNOSIS — Z789 Other specified health status: Secondary | ICD-10-CM | POA: Diagnosis not present

## 2022-12-02 DIAGNOSIS — D225 Melanocytic nevi of trunk: Secondary | ICD-10-CM | POA: Diagnosis not present

## 2022-12-02 DIAGNOSIS — R208 Other disturbances of skin sensation: Secondary | ICD-10-CM | POA: Diagnosis not present

## 2022-12-02 DIAGNOSIS — L538 Other specified erythematous conditions: Secondary | ICD-10-CM | POA: Diagnosis not present

## 2022-12-27 ENCOUNTER — Other Ambulatory Visit: Payer: Self-pay | Admitting: Family Medicine

## 2022-12-27 DIAGNOSIS — M170 Bilateral primary osteoarthritis of knee: Secondary | ICD-10-CM

## 2022-12-29 ENCOUNTER — Other Ambulatory Visit: Payer: Self-pay | Admitting: Family Medicine

## 2022-12-29 DIAGNOSIS — E785 Hyperlipidemia, unspecified: Secondary | ICD-10-CM

## 2023-01-12 ENCOUNTER — Telehealth: Payer: Self-pay | Admitting: Family Medicine

## 2023-01-12 ENCOUNTER — Telehealth: Payer: Federal, State, Local not specified - PPO | Admitting: Family Medicine

## 2023-01-12 ENCOUNTER — Encounter: Payer: Self-pay | Admitting: Family Medicine

## 2023-01-12 DIAGNOSIS — J4540 Moderate persistent asthma, uncomplicated: Secondary | ICD-10-CM

## 2023-01-12 DIAGNOSIS — E559 Vitamin D deficiency, unspecified: Secondary | ICD-10-CM

## 2023-01-12 DIAGNOSIS — F419 Anxiety disorder, unspecified: Secondary | ICD-10-CM

## 2023-01-12 DIAGNOSIS — Z6841 Body Mass Index (BMI) 40.0 and over, adult: Secondary | ICD-10-CM

## 2023-01-12 DIAGNOSIS — M160 Bilateral primary osteoarthritis of hip: Secondary | ICD-10-CM | POA: Diagnosis not present

## 2023-01-12 DIAGNOSIS — R7303 Prediabetes: Secondary | ICD-10-CM

## 2023-01-12 DIAGNOSIS — E7849 Other hyperlipidemia: Secondary | ICD-10-CM | POA: Diagnosis not present

## 2023-01-12 DIAGNOSIS — E785 Hyperlipidemia, unspecified: Secondary | ICD-10-CM

## 2023-01-12 DIAGNOSIS — M170 Bilateral primary osteoarthritis of knee: Secondary | ICD-10-CM | POA: Diagnosis not present

## 2023-01-12 DIAGNOSIS — I471 Supraventricular tachycardia, unspecified: Secondary | ICD-10-CM

## 2023-01-12 MED ORDER — ROSUVASTATIN CALCIUM 10 MG PO TABS: 10.0000 mg | ORAL_TABLET | Freq: Every day | ORAL | 3 refills | Status: DC

## 2023-01-12 MED ORDER — DICLOFENAC SODIUM 75 MG PO TBEC
75.0000 mg | DELAYED_RELEASE_TABLET | Freq: Two times a day (BID) | ORAL | 1 refills | Status: DC
Start: 2023-01-12 — End: 2023-01-12

## 2023-01-12 MED ORDER — DICLOFENAC SODIUM 75 MG PO TBEC: 75.0000 mg | DELAYED_RELEASE_TABLET | Freq: Two times a day (BID) | ORAL | 1 refills | Status: DC

## 2023-01-12 MED ORDER — FENOFIBRATE 160 MG PO TABS: 160.0000 mg | ORAL_TABLET | Freq: Every day | ORAL | 3 refills | Status: DC

## 2023-01-12 NOTE — Assessment & Plan Note (Signed)
Con't paxil and trazadone prn

## 2023-01-12 NOTE — Telephone Encounter (Signed)
Pt returned call and was scheduled.

## 2023-01-12 NOTE — Progress Notes (Signed)
MyChart Video Visit    Virtual Visit via Video Note   This patient is at least at moderate risk for complications without adequate follow up. This format is felt to be most appropriate for this patient at this time. Physical exam was limited by quality of the video and audio technology used for the visit. Herbert Seta was able to get the patient set up on a video visit.  Patient location: home Patient and provider in visit Provider location: Office  I discussed the limitations of evaluation and management by telemedicine and the availability of in person appointments. The patient expressed understanding and agreed to proceed.  Visit Date: 01/12/2023  Today's healthcare provider: Donato Schultz, DO     Subjective:    Patient ID: Tina Hinton, female    DOB: 11-01-1955, 67 y.o.   MRN: [id number]  No chief complaint on file.   HPI Patient is in today for virtual visit for f/u  Discussed the use of AI scribe software for clinical note transcription with the patient, who gave verbal consent to proceed.  History of Present Illness   The patient, with a history of supraventricular tachycardia (SVT), hyperlipidemia, osteoarthritis, and obesity, presents for medication refills. She has not had any more episodes of SVT since starting the beta blocker.  She also takes diclofenac twice daily for osteoarthritis and has noticed a difference when she does not take it. She has been having a lot of problems with her left hip and knee, and the orthopedist advised her to lose weight before she can do anything about it. She has lost 18 pounds so far and is hoping that further weight loss will help ease the pain in her hip and knee. She has been taking metformin twice daily, which could be helping with the weight loss.  She has chronic breathing issues, which are managed with inhalers. She cannot go out in the heat as it exacerbates her breathing issues. She is not currently taking vitamin D.        Past Medical History:  Diagnosis Date   Ankle fracture    Right   Anxiety    Arthritis    hands, knees   Asthma    Bilateral knee pain    Chest pain    Chicken pox    Clavicle fracture    Left   Depression    Foot fracture, left    GERD (gastroesophageal reflux disease)    Hyperlipidemia    IBS (irritable bowel syndrome) 01/19/2017   Joint pain    Low back pain    Osteoarthritis    Other fatigue    Palpitations    Panic attacks    Seasonal allergies    Shortness of breath on exertion    SVT (supraventricular tachycardia)    Swallowing difficulty    Uterine polyp    ablation    Past Surgical History:  Procedure Laterality Date   BIOSPY     HEAD   CESAREAN SECTION     COLONOSCOPY     ENDOMETRIAL ABLATION     MANDIBLE SURGERY     TONSILLECTOMY     TUBAL LIGATION     UPPER GASTROINTESTINAL ENDOSCOPY      Family History  Problem Relation Age of Onset   Obesity Mother    Cancer Mother    Arthritis Mother    Breast cancer Mother 77   Cancer Sister    Arthritis Maternal Grandmother    Heart disease Maternal  Grandmother    Diabetes Maternal Grandmother    Breast cancer Paternal Grandmother    Prostate cancer Paternal Grandfather    Cancer Maternal Aunt    Breast cancer Maternal Aunt    Obesity Other    Colon cancer Neg Hx    Esophageal cancer Neg Hx    Rectal cancer Neg Hx    Stomach cancer Neg Hx     Social History   Socioeconomic History   Marital status: Divorced    Spouse name: Not on file   Number of children: 3   Years of education: Not on file   Highest education level: Not on file  Occupational History   Occupation: Copywriter, advertising: [employer]  Tobacco Use   Smoking status: Never   Smokeless tobacco: Never  Vaping Use   Vaping status: Never Used  Substance and Sexual Activity   Alcohol use: Yes    Comment: occasional   Drug use: No   Sexual activity: Not Currently  Other Topics Concern   Not on file   Social History Narrative   No regular exercise.   Social Determinants of Health   Financial Resource Strain: Not on file  Food Insecurity: No Food Insecurity (04/22/2022)   Hunger Vital Sign    Worried About Running Out of Food in the Last Year: Never true    Ran Out of Food in the Last Year: Never true  Transportation Needs: No Transportation Needs (04/22/2022)   PRAPARE - Administrator, Civil Service (Medical): No    Lack of Transportation (Non-Medical): No  Physical Activity: Not on file  Stress: Not on file  Social Connections: Not on file  Intimate Partner Violence: Not on file    Outpatient Medications Prior to Visit  Medication Sig Dispense Refill   aspirin 81 MG chewable tablet Chew 81 mg by mouth daily.     beclomethasone (QVAR REDIHALER) 40 MCG/ACT inhaler Inhale 2 puffs into the lungs 2 (two) times daily. 3 each 3   diclofenac sodium (VOLTAREN) 1 % GEL Apply 2 g topically 4 (four) times daily as needed.     lansoprazole (PREVACID) 15 MG capsule Take 30 mg by mouth 2 (two) times daily.     loratadine (CLARITIN) 10 MG tablet Take 10 mg by mouth daily.     metoprolol succinate (TOPROL XL) 25 MG 24 hr tablet Take 1 tablet (25 mg total) by mouth at bedtime. 90 tablet 3   ondansetron (ZOFRAN) 4 MG tablet Take 1 tablet (4 mg total) by mouth every 8 (eight) hours as needed for nausea or vomiting. 20 tablet 0   PARoxetine (PAXIL) 10 MG tablet Take 10 mg by mouth daily.     traZODone (DESYREL) 50 MG tablet 1-2 q hs 60 tablet 0   triamcinolone cream (KENALOG) 0.1 % APPLY TO AFFECTED AREA TWICE A DAY 30 g 0   fenofibrate 160 MG tablet Take 1 tablet (160 mg total) by mouth daily. 90 tablet 0   rosuvastatin (CRESTOR) 10 MG tablet Take 1 tablet (10 mg total) by mouth daily. 90 tablet 3   No facility-administered medications prior to visit.    Allergies  Allergen Reactions   Other Rash    entexla   Phenylephrine-Guaifenesin Rash    Review of Systems   Constitutional:  Negative for fever and malaise/fatigue.  HENT:  Negative for congestion.   Eyes:  Negative for blurred vision.  Respiratory:  Negative for cough and shortness  of breath.   Cardiovascular:  Negative for chest pain, palpitations and leg swelling.  Gastrointestinal:  Negative for vomiting.  Musculoskeletal:  Negative for back pain.  Skin:  Negative for rash.  Neurological:  Negative for loss of consciousness and headaches.       Objective:    Physical Exam Vitals and nursing note reviewed.  Constitutional:      General: She is not in acute distress.    Appearance: Normal appearance. She is well-developed.  HENT:     Head: Normocephalic and atraumatic.  Neurological:     Mental Status: She is alert and oriented to person, place, and time.  Psychiatric:        Mood and Affect: Mood normal.        Behavior: Behavior normal.        Thought Content: Thought content normal.        Judgment: Judgment normal.     There were no vitals taken for this visit. Wt Readings from Last 3 Encounters:  10/15/22 245 lb 3.2 oz (111.2 kg)  07/24/22 245 lb 9.6 oz (111.4 kg)  03/10/22 241 lb 12.8 oz (109.7 kg)       Assessment & Plan:  Primary osteoarthritis of both knees  Other hyperlipidemia -     Fenofibrate; Take 1 tablet (160 mg total) by mouth daily.  Dispense: 90 tablet; Refill: 3 -     Rosuvastatin Calcium; Take 1 tablet (10 mg total) by mouth daily.  Dispense: 90 tablet; Refill: 3 -     Lipid panel; Future -     CBC with Differential/Platelet; Future -     Comprehensive metabolic panel; Future  Primary osteoarthritis of both hips -     Diclofenac Sodium; Take 1 tablet (75 mg total) by mouth 2 (two) times daily.  Dispense: 180 tablet; Refill: 1  Moderate persistent asthma, unspecified whether complicated  SVT (supraventricular tachycardia)  Prediabetes Assessment & Plan: RECHECK LABS   Orders: -     Hemoglobin A1c; Future  Vitamin D deficiency -      VITAMIN D 25 Hydroxy (Vit-D Deficiency, Fractures); Future  Anxiety Assessment & Plan: Con't paxil and trazadone prn   Class 3 severe obesity with serious comorbidity and body mass index (BMI) of 45.0 to 49.9 in adult, unspecified obesity type (HCC) Assessment & Plan: Con't with diet and exercise    Hyperlipidemia LDL goal <100 Assessment & Plan: Encourage heart healthy diet such as MIND or DASH diet, increase exercise, avoid trans fats, simple carbohydrates and processed foods, consider a krill or fish or flaxseed oil cap daily.       Assessment and Plan    Hyperlipidemia On Fenofibrate and Crestor, needs refills. Labs due. -Refill Fenofibrate and Crestor. -Order lipid panel.  Supraventricular Tachycardia (SVT) Recently started on beta-blocker by cardiologist. No recent episodes of SVT. -Continue current management.  Osteoarthritis Chronic pain in left hip and knee. Orthopedist recommended weight loss prior to considering surgery. Patient has lost 18 pounds and is on a meal plan. -Continue current management.  Obesity Patient is actively trying to lose weight with a meal plan and Metformin. -Continue current management.  Asthma Chronic issue, managed with inhalers. Difficulty with heat. -Continue current management.  Vitamin D deficiency Unclear if patient is still taking Vitamin D. -Check Vitamin D level.  General Health Maintenance -Schedule annual gynecological exam and mammogram. -Administer flu and COVID vaccines. Pneumonia vaccine not due this year. -Order labs for lipid panel and Vitamin D level.  I discussed the assessment and treatment plan with the patient. The patient was provided an opportunity to ask questions and all were answered. The patient agreed with the plan and demonstrated an understanding of the instructions.   The patient was advised to call back or seek an in-person evaluation if the symptoms worsen or if the condition fails to  improve as anticipated.  Donato Schultz, DO [facility] at [facility] [phone] (phone) [phone] (fax)  [facility]

## 2023-01-12 NOTE — Assessment & Plan Note (Signed)
Cont with diet and exercise 

## 2023-01-12 NOTE — Assessment & Plan Note (Signed)
RECHECK LABS

## 2023-01-12 NOTE — Assessment & Plan Note (Signed)
Encourage heart healthy diet such as MIND or DASH diet, increase exercise, avoid trans fats, simple carbohydrates and processed foods, consider a krill or fish or flaxseed oil cap daily.  °

## 2023-01-12 NOTE — Telephone Encounter (Signed)
LVM to call back for Lab appt scheduling per Dr. Laury Axon

## 2023-01-15 ENCOUNTER — Other Ambulatory Visit (INDEPENDENT_AMBULATORY_CARE_PROVIDER_SITE_OTHER): Payer: Federal, State, Local not specified - PPO

## 2023-01-15 DIAGNOSIS — R7303 Prediabetes: Secondary | ICD-10-CM | POA: Diagnosis not present

## 2023-01-15 DIAGNOSIS — E7849 Other hyperlipidemia: Secondary | ICD-10-CM | POA: Diagnosis not present

## 2023-01-15 DIAGNOSIS — E559 Vitamin D deficiency, unspecified: Secondary | ICD-10-CM

## 2023-01-15 LAB — LIPID PANEL
Cholesterol: 207 mg/dL — ABNORMAL HIGH (ref 0–200)
HDL: 47.8 mg/dL
LDL Cholesterol: 135 mg/dL — ABNORMAL HIGH (ref 0–99)
NonHDL: 159.59
Total CHOL/HDL Ratio: 4
Triglycerides: 122 mg/dL (ref 0.0–149.0)
VLDL: 24.4 mg/dL (ref 0.0–40.0)

## 2023-01-15 LAB — COMPREHENSIVE METABOLIC PANEL
ALT: 18 U/L (ref 0–35)
AST: 19 U/L (ref 0–37)
Albumin: 4.2 g/dL (ref 3.5–5.2)
Alkaline Phosphatase: 64 U/L (ref 39–117)
BUN: 21 mg/dL (ref 6–23)
CO2: 30 mEq/L (ref 19–32)
Calcium: 9.6 mg/dL (ref 8.4–10.5)
Chloride: 103 mEq/L (ref 96–112)
Creatinine, Ser: 0.82 mg/dL (ref 0.40–1.20)
GFR: 73.94 mL/min (ref >60.00)
Glucose, Bld: 95 mg/dL (ref 70–99)
Potassium: 4.9 mEq/L (ref 3.5–5.1)
Sodium: 142 mEq/L (ref 135–145)
Total Bilirubin: 0.3 mg/dL (ref 0.2–1.2)
Total Protein: 6.6 g/dL (ref 6.0–8.3)

## 2023-01-15 LAB — HEMOGLOBIN A1C: Hgb A1c MFr Bld: 6.1 % (ref 4.6–6.5)

## 2023-01-15 LAB — CBC WITH DIFFERENTIAL/PLATELET
Basophils Absolute: 0 K/uL (ref 0.0–0.1)
Basophils Relative: 0.8 % (ref 0.0–3.0)
Eosinophils Absolute: 0.2 K/uL (ref 0.0–0.7)
Eosinophils Relative: 2.9 % (ref 0.0–5.0)
HCT: 41.6 % (ref 36.0–46.0)
Hemoglobin: 13.4 g/dL (ref 12.0–15.0)
Lymphocytes Relative: 34.8 % (ref 12.0–46.0)
Lymphs Abs: 1.9 K/uL (ref 0.7–4.0)
MCHC: 32.2 g/dL (ref 30.0–36.0)
MCV: 89.8 fl (ref 78.0–100.0)
Monocytes Absolute: 0.5 K/uL (ref 0.1–1.0)
Monocytes Relative: 8.5 % (ref 3.0–12.0)
Neutro Abs: 2.9 K/uL (ref 1.4–7.7)
Neutrophils Relative %: 53 % (ref 43.0–77.0)
Platelets: 254 K/uL (ref 150.0–400.0)
RBC: 4.63 Mil/uL (ref 3.87–5.11)
RDW: 14.4 % (ref 11.5–15.5)
WBC: 5.5 K/uL (ref 4.0–10.5)

## 2023-01-15 LAB — VITAMIN D 25 HYDROXY (VIT D DEFICIENCY, FRACTURES): VITD: 24.68 ng/mL — ABNORMAL LOW (ref 30.00–100.00)

## 2023-01-20 ENCOUNTER — Other Ambulatory Visit: Payer: Self-pay

## 2023-01-20 MED ORDER — ROSUVASTATIN CALCIUM 20 MG PO TABS: 20.0000 mg | ORAL_TABLET | Freq: Every day | ORAL | 2 refills | Status: DC

## 2023-01-20 MED ORDER — VITAMIN D (ERGOCALCIFEROL) 1.25 MG (50000 UNIT) PO CAPS: 50000.0000 [IU] | ORAL_CAPSULE | ORAL | 1 refills | Status: DC

## 2023-04-16 NOTE — Progress Notes (Deleted)
HPI: FU SVT.  Abdominal ultrasound February 2021 showed no aneurysm.  Patient was seen at [facility] health November 2023 after she awoke from sleep with palpitations, dyspnea and chest tightness.  By report her heart rate was 230 at time of EMS arrival and electrocardiogram showed SVT.  She was given adenosine which broke her SVT.  Rhythm strips are not available for review. Echo 6/24 showed normal LV function, mild LVH, grade 1 DD. Since last seen,   Current Outpatient Medications  Medication Sig Dispense Refill   rosuvastatin (CRESTOR) 20 MG tablet Take 1 tablet (20 mg total) by mouth at bedtime. 30 tablet 2   Vitamin D, Ergocalciferol, (DRISDOL) 1.25 MG (50000 UNIT) CAPS capsule Take 1 capsule (50,000 Units total) by mouth every 7 (seven) days. 12 capsule 1   aspirin 81 MG chewable tablet Chew 81 mg by mouth daily.     beclomethasone (QVAR REDIHALER) 40 MCG/ACT inhaler Inhale 2 puffs into the lungs 2 (two) times daily. 3 each 3   diclofenac (VOLTAREN) 75 MG EC tablet Take 1 tablet (75 mg total) by mouth 2 (two) times daily. 180 tablet 1   diclofenac sodium (VOLTAREN) 1 % GEL Apply 2 g topically 4 (four) times daily as needed.     fenofibrate 160 MG tablet Take 1 tablet (160 mg total) by mouth daily. 90 tablet 3   lansoprazole (PREVACID) 15 MG capsule Take 30 mg by mouth 2 (two) times daily.     loratadine (CLARITIN) 10 MG tablet Take 10 mg by mouth daily.     metoprolol succinate (TOPROL XL) 25 MG 24 hr tablet Take 1 tablet (25 mg total) by mouth at bedtime. 90 tablet 3   ondansetron (ZOFRAN) 4 MG tablet Take 1 tablet (4 mg total) by mouth every 8 (eight) hours as needed for nausea or vomiting. 20 tablet 0   PARoxetine (PAXIL) 10 MG tablet Take 10 mg by mouth daily.     traZODone (DESYREL) 50 MG tablet 1-2 q hs 60 tablet 0   triamcinolone cream (KENALOG) 0.1 % APPLY TO AFFECTED AREA TWICE A DAY 30 g 0   No current facility-administered medications for this visit.     Past Medical  History:  Diagnosis Date   Ankle fracture    Right   Anxiety    Arthritis    hands, knees   Asthma    Bilateral knee pain    Chest pain    Chicken pox    Clavicle fracture    Left   Depression    Foot fracture, left    GERD (gastroesophageal reflux disease)    Hyperlipidemia    IBS (irritable bowel syndrome) 01/19/2017   Joint pain    Low back pain    Osteoarthritis    Other fatigue    Palpitations    Panic attacks    Seasonal allergies    Shortness of breath on exertion    SVT (supraventricular tachycardia)    Swallowing difficulty    Uterine polyp    ablation    Past Surgical History:  Procedure Laterality Date   BIOSPY     HEAD   CESAREAN SECTION     COLONOSCOPY     ENDOMETRIAL ABLATION     MANDIBLE SURGERY     TONSILLECTOMY     TUBAL LIGATION     UPPER GASTROINTESTINAL ENDOSCOPY      Social History   Socioeconomic History   Marital status: Divorced    Spouse  name: Not on file   Number of children: 3   Years of education: Not on file   Highest education level: Not on file  Occupational History   Occupation: Copywriter, advertising: [employer]  Tobacco Use   Smoking status: Never   Smokeless tobacco: Never  Vaping Use   Vaping status: Never Used  Substance and Sexual Activity   Alcohol use: Yes    Comment: occasional   Drug use: No   Sexual activity: Not Currently  Other Topics Concern   Not on file  Social History Narrative   No regular exercise.   Social Determinants of Health   Financial Resource Strain: Not on file  Food Insecurity: No Food Insecurity (04/22/2022)   Hunger Vital Sign    Worried About Running Out of Food in the Last Year: Never true    Ran Out of Food in the Last Year: Never true  Transportation Needs: No Transportation Needs (04/22/2022)   PRAPARE - Administrator, Civil Service (Medical): No    Lack of Transportation (Non-Medical): No  Physical Activity: Not on file  Stress: Not on file   Social Connections: Not on file  Intimate Partner Violence: Not on file    Family History  Problem Relation Age of Onset   Obesity Mother    Cancer Mother    Arthritis Mother    Breast cancer Mother 91   Cancer Sister    Arthritis Maternal Grandmother    Heart disease Maternal Grandmother    Diabetes Maternal Grandmother    Breast cancer Paternal Grandmother    Prostate cancer Paternal Grandfather    Cancer Maternal Aunt    Breast cancer Maternal Aunt    Obesity Other    Colon cancer Neg Hx    Esophageal cancer Neg Hx    Rectal cancer Neg Hx    Stomach cancer Neg Hx     ROS: no fevers or chills, productive cough, hemoptysis, dysphasia, odynophagia, melena, hematochezia, dysuria, hematuria, rash, seizure activity, orthopnea, PND, pedal edema, claudication. Remaining systems are negative.  Physical Exam: Well-developed well-nourished in no acute distress.  Skin is warm and dry.  HEENT is normal.  Neck is supple.  Chest is clear to auscultation with normal expansion.  Cardiovascular exam is regular rate and rhythm.  Abdominal exam nontender or distended. No masses palpated. Extremities show no edema. neuro grossly intact  ECG- personally reviewed  A/P  1 supraventricular tachycardia-patient has had no prolonged episodes of SVT since last office visit.  Will continue Toprol at present dose.  If she has more frequent episodes in the future we will refer for ablation.  2 hyperlipidemia-continue present medications including Crestor and fenofibrate.  Olga Millers, MD

## 2023-04-29 ENCOUNTER — Ambulatory Visit: Payer: Federal, State, Local not specified - PPO | Admitting: Cardiology

## 2023-06-04 ENCOUNTER — Other Ambulatory Visit: Payer: Self-pay | Admitting: Family Medicine

## 2023-06-04 DIAGNOSIS — M545 Low back pain, unspecified: Secondary | ICD-10-CM | POA: Diagnosis not present

## 2023-06-04 DIAGNOSIS — E7849 Other hyperlipidemia: Secondary | ICD-10-CM

## 2023-06-16 ENCOUNTER — Other Ambulatory Visit (HOSPITAL_BASED_OUTPATIENT_CLINIC_OR_DEPARTMENT_OTHER): Payer: Self-pay | Admitting: Family Medicine

## 2023-06-16 DIAGNOSIS — Z1231 Encounter for screening mammogram for malignant neoplasm of breast: Secondary | ICD-10-CM

## 2023-06-17 ENCOUNTER — Other Ambulatory Visit: Payer: Self-pay | Admitting: Obstetrics & Gynecology

## 2023-06-17 MED ORDER — PAROXETINE HCL 10 MG PO TABS: 10.0000 mg | ORAL_TABLET | Freq: Every day | ORAL | 2 refills | Status: DC

## 2023-06-29 ENCOUNTER — Encounter (HOSPITAL_BASED_OUTPATIENT_CLINIC_OR_DEPARTMENT_OTHER): Payer: Self-pay

## 2023-06-29 ENCOUNTER — Ambulatory Visit (HOSPITAL_BASED_OUTPATIENT_CLINIC_OR_DEPARTMENT_OTHER)
Admission: RE | Admit: 2023-06-29 | Discharge: 2023-06-29 | Disposition: A | Discharge status: Home / Self Care | Payer: Federal, State, Local not specified - PPO | Source: Ambulatory Visit | Attending: Family Medicine | Admitting: Family Medicine

## 2023-06-29 DIAGNOSIS — Z1231 Encounter for screening mammogram for malignant neoplasm of breast: Secondary | ICD-10-CM | POA: Insufficient documentation

## 2023-07-01 ENCOUNTER — Ambulatory Visit: Payer: Federal, State, Local not specified - PPO | Admitting: Obstetrics & Gynecology

## 2023-07-01 ENCOUNTER — Encounter: Payer: Self-pay | Admitting: Obstetrics & Gynecology

## 2023-07-01 VITALS — BP 134/71 | HR 92 | Ht 61.0 in | Wt 238.0 lb

## 2023-07-01 DIAGNOSIS — Z01419 Encounter for gynecological examination (general) (routine) without abnormal findings: Secondary | ICD-10-CM | POA: Diagnosis not present

## 2023-07-01 DIAGNOSIS — Z6841 Body Mass Index (BMI) 40.0 and over, adult: Secondary | ICD-10-CM

## 2023-07-01 NOTE — Progress Notes (Signed)
Subjective:     Tina Hinton is a 67 y.o. female here for a routine exam.  Current complaints: Pt denies GYN complaints. She reports chronic unrelenting knees pain that requires her to use a cane which is new since her last visit. She reports that she needs surgery but, is required to lose 50#'s first. She is hesitant to take meds or have surgery for weight loss.     Last colonoscopy was 2018   Gynecologic History No LMP recorded. Patient is postmenopausal. Contraception: post menopausal status Last Pap: 01/08/2022. Results were: normal Last mammogram: 06/29/2023. Results were: normal  Obstetric History OB History  Gravida Para Term Preterm AB Living  3 3 3   3   SAB IAB Ectopic Multiple Live Births      3    # Outcome Date GA Lbr Len/2nd Weight Sex Type Anes PTL Lv  3 Term 3 [redacted]w[redacted]d   M CS-LTranv Spinal  LIV  2 Term 1981 [redacted]w[redacted]d   M CS-LTranv Spinal  LIV  1 Term 1979 [redacted]w[redacted]d   M Vag-Spont EPI  LIV     The following portions of the patient's history were reviewed and updated as appropriate: allergies, current medications, past family history, past medical history, past social history, past surgical history, and problem list.  Review of Systems Pertinent items are noted in HPI.    Objective:  BP 134/71 (BP Location: Left Arm, Patient Position: Sitting, Cuff Size: Large)   Pulse 92   Ht 5\' 1"  (1.549 m)   Wt 238 lb (108 kg)   BMI 44.97 kg/m  General Appearance:    Alert, cooperative, no distress, appears stated age  Head:    Normocephalic, without obvious abnormality, atraumatic  Eyes:    conjunctiva/corneas clear, EOM's intact, both eyes  Ears:    Normal external ear canals, both ears  Nose:   Nares normal, septum midline, mucosa normal, no drainage    or sinus tenderness  Throat:   Lips, mucosa, and tongue normal; teeth and gums normal  Neck:   Supple, symmetrical, trachea midline, no adenopathy;    thyroid:  no enlargement/tenderness/nodules  Back:     Symmetric, no  curvature, ROM normal, no CVA tenderness  Lungs:     respirations unlabored  Chest Wall:    No tenderness or deformity   Heart:    Regular rate and rhythm  Breast Exam:    No tenderness, masses, or nipple abnormality  Abdomen:     Soft, non-tender, bowel sounds active all four quadrants,    no masses, no organomegaly  Genitalia:    Normal female without lesion, discharge or tenderness     Extremities:   Extremities normal, atraumatic, no cyanosis or edema  Pulses:   2+ and symmetric all extremities  Skin:   Skin color, texture, turgor normal, no rashes or lesions     Assessment:    Healthy female exam.  BMI 44.97- d/w pts options for medically assisted weight loss including medications and surgery. Discussed some of the risks vs benefits.  Breast and cervical cancer screening is UTD  Plan:   Tina "Lawson Fiscal" was seen today for gynecologic exam.  Diagnoses and all orders for this visit:  Well female exam with routine gynecological exam  Body mass index (BMI) of 40.1 to 44.9 in adult [facility])   F/u in 1 year.   Petr Bontempo L. Harraway-Smith, M.D., [place]

## 2023-08-29 ENCOUNTER — Other Ambulatory Visit: Payer: Self-pay | Admitting: Family Medicine

## 2023-08-29 DIAGNOSIS — M160 Bilateral primary osteoarthritis of hip: Secondary | ICD-10-CM

## 2023-10-07 ENCOUNTER — Encounter: Payer: Self-pay | Admitting: Family Medicine

## 2023-10-15 ENCOUNTER — Ambulatory Visit: Admitting: Family Medicine

## 2023-10-15 VITALS — BP 110/80 | HR 86 | Temp 98.5°F | Resp 18 | Ht 61.0 in | Wt 234.0 lb

## 2023-10-15 DIAGNOSIS — E559 Vitamin D deficiency, unspecified: Secondary | ICD-10-CM

## 2023-10-15 DIAGNOSIS — E66813 Obesity, class 3: Secondary | ICD-10-CM

## 2023-10-15 DIAGNOSIS — Z6841 Body Mass Index (BMI) 40.0 and over, adult: Secondary | ICD-10-CM

## 2023-10-15 DIAGNOSIS — E785 Hyperlipidemia, unspecified: Secondary | ICD-10-CM | POA: Diagnosis not present

## 2023-10-15 DIAGNOSIS — R7303 Prediabetes: Secondary | ICD-10-CM | POA: Diagnosis not present

## 2023-10-15 DIAGNOSIS — M170 Bilateral primary osteoarthritis of knee: Secondary | ICD-10-CM

## 2023-10-15 DIAGNOSIS — F40240 Claustrophobia: Secondary | ICD-10-CM

## 2023-10-15 LAB — COMPREHENSIVE METABOLIC PANEL WITH GFR
ALT: 23 U/L (ref 0–35)
AST: 24 U/L (ref 0–37)
Albumin: 4.3 g/dL (ref 3.5–5.2)
Alkaline Phosphatase: 67 U/L (ref 39–117)
BUN: 20 mg/dL (ref 6–23)
CO2: 29 meq/L (ref 19–32)
Calcium: 9.4 mg/dL (ref 8.4–10.5)
Chloride: 105 meq/L (ref 96–112)
Creatinine, Ser: 0.74 mg/dL (ref 0.40–1.20)
GFR: 83.19 mL/min (ref >60.00)
Glucose, Bld: 100 mg/dL — ABNORMAL HIGH (ref 70–99)
Potassium: 4.6 meq/L (ref 3.5–5.1)
Sodium: 142 meq/L (ref 135–145)
Total Bilirubin: 0.4 mg/dL (ref 0.2–1.2)
Total Protein: 6.6 g/dL (ref 6.0–8.3)

## 2023-10-15 LAB — CBC WITH DIFFERENTIAL/PLATELET
Basophils Absolute: 0 10*3/uL (ref 0.0–0.1)
Basophils Relative: 1 % (ref 0.0–3.0)
Eosinophils Absolute: 0.2 10*3/uL (ref 0.0–0.7)
Eosinophils Relative: 3.8 % (ref 0.0–5.0)
HCT: 39.8 % (ref 36.0–46.0)
Hemoglobin: 13 g/dL (ref 12.0–15.0)
Lymphocytes Relative: 36.9 % (ref 12.0–46.0)
Lymphs Abs: 1.6 10*3/uL (ref 0.7–4.0)
MCHC: 32.7 g/dL (ref 30.0–36.0)
MCV: 91.1 fl (ref 78.0–100.0)
Monocytes Absolute: 0.4 10*3/uL (ref 0.1–1.0)
Monocytes Relative: 8.8 % (ref 3.0–12.0)
Neutro Abs: 2.1 10*3/uL (ref 1.4–7.7)
Neutrophils Relative %: 49.5 % (ref 43.0–77.0)
Platelets: 257 10*3/uL (ref 150.0–400.0)
RBC: 4.37 Mil/uL (ref 3.87–5.11)
RDW: 14.3 % (ref 11.5–15.5)
WBC: 4.2 10*3/uL (ref 4.0–10.5)

## 2023-10-15 LAB — LIPID PANEL
Cholesterol: 173 mg/dL (ref 0–200)
HDL: 47.5 mg/dL (ref >39.00)
LDL Cholesterol: 102 mg/dL — ABNORMAL HIGH (ref 0–99)
NonHDL: 125.07
Total CHOL/HDL Ratio: 4
Triglycerides: 116 mg/dL (ref 0.0–149.0)
VLDL: 23.2 mg/dL (ref 0.0–40.0)

## 2023-10-15 LAB — HEMOGLOBIN A1C: Hgb A1c MFr Bld: 6.1 % (ref 4.6–6.5)

## 2023-10-15 LAB — VITAMIN D 25 HYDROXY (VIT D DEFICIENCY, FRACTURES): VITD: 24.25 ng/mL — ABNORMAL LOW (ref 30.00–100.00)

## 2023-10-15 LAB — TSH: TSH: 1.58 u[IU]/mL (ref 0.35–5.50)

## 2023-10-15 LAB — VITAMIN B12: Vitamin B-12: 200 pg/mL — ABNORMAL LOW (ref 211–911)

## 2023-10-15 MED ORDER — LORAZEPAM 0.5 MG PO TABS: 0.5000 mg | ORAL_TABLET | Freq: Three times a day (TID) | ORAL | 0 refills | Status: AC

## 2023-10-15 NOTE — Assessment & Plan Note (Signed)
 Ativan written for mri due to claustrophobia

## 2023-10-15 NOTE — Assessment & Plan Note (Signed)
 Encourage heart healthy diet such as MIND or DASH diet, increase exercise, avoid trans fats, simple carbohydrates and processed foods, consider a krill or fish or flaxseed oil cap daily.

## 2023-10-15 NOTE — Assessment & Plan Note (Signed)
 Cont with weight management  Wegovy to be started  Check labs  Return to office 6 months or sooner as needed

## 2023-10-15 NOTE — Progress Notes (Signed)
 Established Patient Office Visit  Subjective   Patient ID: Tina Hinton, female    DOB: 1956/04/08  Age: 68 y.o. MRN: [id number]  Chief Complaint  Patient presents with   Weight Loss    HPI Discussed the use of AI scribe software for clinical note transcription with the patient, who gave verbal consent to proceed.  History of Present Illness Tina Hinton "Tina Hinton" is a 67 year old female with obesity and arthritis who presents with worsening sciatica and knee pain.  She experiences severe sciatica on her left side, significantly impacting her mobility. The pain originates in her groin and radiates down the front of her leg to her knee, without extending to her foot. This persistent and debilitating pain prevents her from walking and participating in activities such as swimming, which she previously enjoyed.  She also has chronic left knee pain, diagnosed with mild to moderate arthritic changes. The knee is constantly swollen and painful, and she has fallen on it multiple times, exacerbating the condition. An orthopedic doctor suggested a partial knee replacement, contingent upon weight loss. She has not had recent injections for the knee pain, preferring to lose weight first. The knee pain is severe enough to disrupt her sleep, and she has run out of Flexeril, which she uses to manage the pain.  She is currently pursuing weight loss to alleviate her symptoms and has started a program with [facility], where she was prescribed Wegovy. She is awaiting confirmation from the pharmacy regarding the medication. She adheres to a meal plan similar to one she followed previously, focusing on high protein and low carbohydrate intake, and has eliminated sodas from her diet. Her sister, who successfully lost weight on a similar program, has been a source of support and guidance.  She has a history of back surgeries and experiences claustrophobia, which affects her ability to undergo  certain diagnostic procedures like MRIs. She has hearing aids, which have significantly improved her quality of life, allowing her to hear better and adjust the volume of her surroundings.   Patient Active Problem List   Diagnosis Date Noted   Claustrophobia 10/15/2023   Vitamin D  deficiency 06/25/2021   Class 3 severe obesity with serious comorbidity and body mass index (BMI) of 45.0 to 49.9 in adult 03/12/2021   Prediabetes 03/12/2021   Lumbar strain, subsequent encounter 02/14/2021   Asthma    Vertigo 01/31/2020   Aortic atherosclerosis (HCC) 02/15/2018   IBS (irritable bowel syndrome) - D 01/19/2017   Primary osteoarthritis of both knees 01/19/2017   Preventative health care 07/30/2015   Obesity, unspecified 07/30/2015   Anxiety 06/25/2015   Hyperlipidemia LDL goal <100 06/25/2015   Sprain of medial collateral ligament of left knee 06/25/2015   Osteoarthritis of both knees 06/25/2015   Degeneration of intervertebral disc of lumbar region 01/04/2015   Spondylolisthesis at L5-S1 level 01/04/2015   Fracture of lateral malleolus 11/14/2014   Foot, fracture, navicular 11/14/2014   Past Medical History:  Diagnosis Date   Ankle fracture    Right   Anxiety    Arthritis    hands, knees   Asthma    Bilateral knee pain    Chest pain    Chicken pox    Clavicle fracture    Left   Depression    Foot fracture, left    GERD (gastroesophageal reflux disease)    Hyperlipidemia    IBS (irritable bowel syndrome) 01/19/2017   Joint pain    Low back pain  Osteoarthritis    Other fatigue    Palpitations    Panic attacks    Seasonal allergies    Shortness of breath on exertion    SVT (supraventricular tachycardia) (HCC)    Swallowing difficulty    Uterine polyp    ablation   Past Surgical History:  Procedure Laterality Date   BIOSPY     HEAD   CESAREAN SECTION     COLONOSCOPY     ENDOMETRIAL ABLATION     MANDIBLE SURGERY     TONSILLECTOMY     TUBAL LIGATION     UPPER  GASTROINTESTINAL ENDOSCOPY     Social History   Tobacco Use   Smoking status: Never   Smokeless tobacco: Never  Vaping Use   Vaping status: Never Used  Substance Use Topics   Alcohol use: Yes    Comment: occasional   Drug use: No   Social History   Socioeconomic History   Marital status: Divorced    Spouse name: Not on file   Number of children: 3   Years of education: Not on file   Highest education level: Some college, no degree  Occupational History   Occupation: Copywriter, 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   Social Drivers of Corporate investment banker Strain: Low Risk  (10/15/2023)   Overall Financial Resource Strain (CARDIA)    Difficulty of Paying Living Expenses: Not hard at all  Food Insecurity: No Food Insecurity (10/15/2023)   Hunger Vital Sign    Worried About Running Out of Food in the Last Year: Never true    Ran Out of Food in the Last Year: Never true  Transportation Needs: No Transportation Needs (10/15/2023)   PRAPARE - Administrator, Civil Service (Medical): No    Lack of Transportation (Non-Medical): No  Physical Activity: Unknown (10/15/2023)   Exercise Vital Sign    Days of Exercise per Week: 0 days    Minutes of Exercise per Session: Not on file  Stress: No Stress Concern Present (10/15/2023)   [employer] of Occupational Health - Occupational Stress Questionnaire    Feeling of Stress : Only a little  Social Connections: Unknown (10/15/2023)   Social Connection and Isolation Panel [NHANES]    Frequency of Communication with Friends and Family: Once a week    Frequency of Social Gatherings with Friends and Family: Patient  declined    Attends Religious Services: Never    Database administrator or Organizations: No    Attends Engineer, structural: Not on file    Marital Status: Divorced  Catering manager Violence: Not on file   Family Status  Relation Name Status   Mother  Deceased   Father  Deceased   Sister  Alive   MGM  Deceased   PGM  (Not Specified)   PGF  (Not Specified)   Mat Aunt  Deceased   Other  (Not Specified)   Neg Hx  (Not Specified)  No partnership data on file   Family History  Problem Relation Age of Onset   Obesity Mother  Cancer Mother    Arthritis Mother    Breast cancer Mother 46   Cancer Sister    Arthritis Maternal Grandmother    Heart disease Maternal Grandmother    Diabetes Maternal Grandmother    Breast cancer Paternal Grandmother    Prostate cancer Paternal Grandfather    Cancer Maternal Aunt    Breast cancer Maternal Aunt    Obesity Other    Colon cancer Neg Hx    Esophageal cancer Neg Hx    Rectal cancer Neg Hx    Stomach cancer Neg Hx    Allergies  Allergen Reactions   Other Rash    entexla   Phenylephrine-Guaifenesin Rash      Review of Systems  Constitutional:  Negative for fever and malaise/fatigue.  HENT:  Negative for congestion.   Eyes:  Negative for blurred vision.  Respiratory:  Negative for cough and shortness of breath.   Cardiovascular:  Negative for chest pain, palpitations and leg swelling.  Gastrointestinal:  Negative for vomiting.  Musculoskeletal:  Negative for back pain.  Skin:  Negative for rash.  Neurological:  Negative for loss of consciousness and headaches.  Psychiatric/Behavioral:  The patient is nervous/anxious.       Objective:     BP 110/80 (BP Location: Left Arm, Patient Position: Sitting, Cuff Size: Large)   Pulse 86   Temp 98.5 F (36.9 C) (Oral)   Resp 18   Ht 5\' 1"  (1.549 m)   Wt 234 lb (106.1 kg)   SpO2 97%   BMI 44.21 kg/m  BP Readings from Last 3 Encounters:  10/15/23 110/80  07/01/23  134/71  10/15/22 (!) 166/68   Wt Readings from Last 3 Encounters:  10/15/23 234 lb (106.1 kg)  07/01/23 238 lb (108 kg)  10/15/22 245 lb 3.2 oz (111.2 kg)   SpO2 Readings from Last 3 Encounters:  10/15/23 97%  07/24/22 94%  03/10/22 95%      Physical Exam Vitals and nursing note reviewed.  Constitutional:      General: She is not in acute distress.    Appearance: Normal appearance. She is well-developed.  HENT:     Head: Normocephalic and atraumatic.  Eyes:     General: No scleral icterus.       Right eye: No discharge.        Left eye: No discharge.  Cardiovascular:     Rate and Rhythm: Normal rate and regular rhythm.     Heart sounds: No murmur heard. Pulmonary:     Effort: Pulmonary effort is normal. No respiratory distress.     Breath sounds: Normal breath sounds.  Musculoskeletal:        General: Tenderness present. Normal range of motion.     Cervical back: Normal range of motion and neck supple.     Right lower leg: No edema.     Left lower leg: No edema.  Skin:    General: Skin is warm and dry.  Neurological:     General: No focal deficit present.     Mental Status: She is alert and oriented to person, place, and time.     Motor: Weakness present.     Gait: Gait abnormal.  Psychiatric:        Mood and Affect: Mood normal.        Behavior: Behavior normal.        Thought Content: Thought content normal.        Judgment: Judgment normal.  No results found for any visits on 10/15/23.  Last CBC Lab Results  Component Value Date   WBC 5.5 01/15/2023   HGB 13.4 01/15/2023   HCT 41.6 01/15/2023   MCV 89.8 01/15/2023   MCH 29.8 11/16/2020   RDW 14.4 01/15/2023   PLT 254.0 01/15/2023   Last metabolic panel Lab Results  Component Value Date   GLUCOSE 95 01/15/2023   NA 142 01/15/2023   K 4.9 01/15/2023   CL 103 01/15/2023   CO2 30 01/15/2023   BUN 21 01/15/2023   CREATININE 0.82 01/15/2023   GFR 73.94 01/15/2023   CALCIUM  9.6 01/15/2023    PROT 6.6 01/15/2023   ALBUMIN 4.2 01/15/2023   LABGLOB 2.1 05/14/2021   AGRATIO 2.2 05/14/2021   BILITOT 0.3 01/15/2023   ALKPHOS 64 01/15/2023   AST 19 01/15/2023   ALT 18 01/15/2023   Last lipids Lab Results  Component Value Date   CHOL 207 (H) 01/15/2023   HDL 47.80 01/15/2023   LDLCALC 135 (H) 01/15/2023   LDLDIRECT 155.0 07/24/2022   TRIG 122.0 01/15/2023   CHOLHDL 4 01/15/2023   Last hemoglobin A1c Lab Results  Component Value Date   HGBA1C 6.1 01/15/2023   Last thyroid  functions Lab Results  Component Value Date   TSH 3.70 07/24/2022   Last vitamin D  Lab Results  Component Value Date   VD25OH 24.68 (L) 01/15/2023   Last vitamin B12 and Folate Lab Results  Component Value Date   VITAMINB12 324 02/12/2021   FOLATE 5.9 02/12/2021      The 10-year ASCVD risk score (Arnett DK, et al., 2019) is: 6.1%    Assessment & Plan:   Problem List Items Addressed This Visit       Unprioritized   Osteoarthritis of both knees   Relevant Orders   MR Knee Left  Wo Contrast   Vitamin D  deficiency   Relevant Orders   TSH   VITAMIN D  25 Hydroxy (Vit-D Deficiency, Fractures)   Obesity, unspecified - Primary   Relevant Orders   Hemoglobin A1c   Insulin , random   VITAMIN D  25 Hydroxy (Vit-D Deficiency, Fractures)   Vitamin B12   Prediabetes   Relevant Orders   Hemoglobin A1c   Hyperlipidemia LDL goal <100   Encourage heart healthy diet such as MIND or DASH diet, increase exercise, avoid trans fats, simple carbohydrates and processed foods, consider a krill or fish or flaxseed oil cap daily.        Relevant Orders   CBC with Differential/Platelet   Comprehensive metabolic panel with GFR   Lipid panel   Claustrophobia   Ativan written for mri due to claustrophobia       Relevant Medications   LORazepam (ATIVAN) 0.5 MG tablet   Class 3 severe obesity with serious comorbidity and body mass index (BMI) of 45.0 to 49.9 in adult   Cont with weight management   Wegovy to be started  Check labs  Return to office 6 months or sooner as needed      Assessment and Plan Assessment & Plan Obesity   She faces significant challenges due to obesity, including worsened sciatica and osteoarthritis symptoms. Motivated to lose weight, she has started a weight loss program with [phone], which is covered by insurance. She follows a meal plan similar to a previous one and is aware of potential side effects like reduced appetite and gastrointestinal issues, prepared to manage them with guidance from her sister who has undergone similar treatment. Weight  loss surgery is a last resort if [facility] proves ineffective. Continue Wegovy as prescribed, adhere to the meal plan, monitor for side effects, and consider surgery only if necessary.  Osteoarthritis of left knee   Chronic osteoarthritis in her left knee causes constant pain and swelling, worsened by obesity. She has fallen on the knee multiple times, exacerbating the condition. A previous orthopedic consultation suggested a partial knee replacement, contingent on weight loss. She experiences significant functional limitations and pain, impacting her quality of life. An MRI is needed to assess the extent of damage, but she has anxiety about the procedure due to claustrophobia, requiring Ativan for management. Order an MRI, prescribe Flexeril for pain management and sleep aid, consider referral to sports medicine for possible knee injections after weight loss, and prescribe Ativan for anxiety during the MRI.  Sciatica   Chronic sciatica causes pain radiating from her lower back to the left knee, not extending to the foot. It is aggravated by obesity, limiting mobility and contributing to her inability to exercise, which exacerbates weight issues. Encourage weight loss to alleviate pressure on the sciatic nerve and consider physical therapy or other interventions post-weight loss.  Hearing loss, bilateral   She has bilateral  hearing loss with significant improvement using hearing aids, reporting clearer hearing at a lower volume than before. Continue using hearing aids as prescribed.    Return in about 6 months (around 04/16/2024), or if symptoms worsen or fail to improve.    Paije Goodhart R Lowne Chase, DO

## 2023-10-15 NOTE — Patient Instructions (Signed)
Arthritis Arthritis is a term that is commonly used to refer to joint pain or joint disease. There are more than 100 types of arthritis. What are the causes? The most common cause of this condition is wear and tear of a joint. Other causes include: Gout. Inflammation of a joint. An infection of a joint. Sprains and other injuries near the joint. A reaction to medicines or drugs, or an allergic reaction. In some cases, the cause may not be known. What are the signs or symptoms? The main symptom of this condition is pain in the joint during movement. Other symptoms include: Redness, swelling, or stiffness at a joint. Warmth coming from the joint. Fever. Overall feeling of illness. How is this diagnosed? This condition may be diagnosed with a physical exam and tests, including: Blood tests. Urine tests. Imaging tests, such as X-rays, an MRI, or a CT scan. Sometimes, fluid is removed from a joint for testing. How is this treated? This condition may be treated with: Treatment of the cause, if it is known. Rest. Raising (elevating) the joint. Applying cold or hot packs to the joint. Medicines to improve symptoms and reduce inflammation. Injections of a steroid, such as cortisone, into the joint to help reduce pain and inflammation. Depending on the cause of your arthritis, you may need to make lifestyle changes to reduce stress on your joint. Changes may include: Exercising more. Losing weight. Follow these instructions at home: Medicines Take over-the-counter and prescription medicines only as told by your health care provider. Do not take aspirin to relieve pain if your health care provider thinks that gout may be causing your pain. Activity Rest your joint if told by your health care provider. Rest is important when your disease is active and your joint feels painful, swollen, or stiff. Avoid activities that make the pain worse. Balance activity with rest. Exercise your joint  regularly with range-of-motion exercises as told by your health care provider. Try doing low-impact exercise, such as: Swimming. Water aerobics. Biking. Walking. Managing pain, stiffness, and swelling     If directed, put ice on the affected joint. To do this: Put ice in a plastic bag. Place a towel between your skin and the bag. Leave the ice on for 20 minutes, 2-3 times a day. Remove the ice if your skin turns bright red. This is very important. If you cannot feel pain, heat, or cold, you have a greater risk of damage to the area. If your joint is swollen, raise (elevate) it above the level of your heart if directed by your health care provider. If your joint feels stiff in the morning, try taking a warm shower. If directed, apply heat to the affected area as often as told by your health care provider. Use the heat source that your health care provider recommends, such as a moist heat pack or a heating pad. To apply heat: Place a towel between your skin and the heat source. Leave the heat on for 20-30 minutes. Remove the heat if your skin turns bright red. This is especially important if you are unable to feel pain, heat, or cold. You have a greater risk of getting burned. General instructions Maintain a healthy weight. Follow instructions from your health care provider for weight control. Do not use any products that contain nicotine or tobacco. These products include cigarettes, chewing tobacco, and vaping devices, such as e-cigarettes. If you need help quitting, ask your health care provider. Keep all follow-up visits. This is important. Where  to find more information [employer]: www.niams.http://www.myers.net/ Contact a health care provider if: The pain gets worse. You have a fever. Get help right away if: You develop severe joint pain, swelling, or redness. Many joints become painful and swollen. You develop severe back pain. You develop severe weakness in your  leg. Summary Arthritis is a term that is commonly used to refer to joint pain or joint disease. There are more than 100 types of arthritis. The most common cause of this condition is wear and tear of a joint. Other causes include gout, inflammation or infection of the joint, sprains, or allergies. Symptoms of this condition include redness, swelling, or stiffness of the joint. Other symptoms include warmth, fever, or feeling ill. This condition is treated with rest, elevation, medicines, and applying cold or hot packs. Follow your health care provider's instructions about medicines, activity, exercises, and other home care treatments. This information is not intended to replace advice given to you by your health care provider. Make sure you discuss any questions you have with your health care provider. Document Revised: 03/05/2021 Document Reviewed: 03/05/2021 Elsevier Patient Education  2024 [employer].

## 2023-10-16 LAB — INSULIN, RANDOM: Insulin: 10.5 u[IU]/mL

## 2023-10-20 ENCOUNTER — Ambulatory Visit: Payer: Self-pay | Admitting: Family Medicine

## 2023-10-23 ENCOUNTER — Other Ambulatory Visit: Payer: Self-pay

## 2023-10-23 MED ORDER — VITAMIN D (ERGOCALCIFEROL) 1.25 MG (50000 UNIT) PO CAPS: 50000.0000 [IU] | ORAL_CAPSULE | ORAL | 1 refills | Status: AC

## 2023-11-09 ENCOUNTER — Other Ambulatory Visit: Payer: Self-pay | Admitting: Family Medicine

## 2023-11-09 ENCOUNTER — Encounter: Payer: Self-pay | Admitting: Family Medicine

## 2023-11-09 DIAGNOSIS — E7849 Other hyperlipidemia: Secondary | ICD-10-CM

## 2023-11-09 MED ORDER — CYCLOBENZAPRINE HCL 10 MG PO TABS: 10.0000 mg | ORAL_TABLET | Freq: Three times a day (TID) | ORAL | 0 refills | Status: DC | PRN

## 2023-11-09 MED ORDER — FENOFIBRATE 160 MG PO TABS: 160.0000 mg | ORAL_TABLET | Freq: Every day | ORAL | 3 refills | Status: AC

## 2023-11-23 ENCOUNTER — Ambulatory Visit: Payer: Self-pay

## 2023-11-23 ENCOUNTER — Encounter: Payer: Self-pay | Admitting: Family Medicine

## 2023-12-07 ENCOUNTER — Other Ambulatory Visit (INDEPENDENT_AMBULATORY_CARE_PROVIDER_SITE_OTHER): Payer: Self-pay

## 2023-12-07 ENCOUNTER — Ambulatory Visit: Admitting: Orthopaedic Surgery

## 2023-12-07 ENCOUNTER — Encounter: Payer: Self-pay | Admitting: Orthopaedic Surgery

## 2023-12-07 VITALS — Ht 61.0 in | Wt 221.0 lb

## 2023-12-07 DIAGNOSIS — G8929 Other chronic pain: Secondary | ICD-10-CM | POA: Diagnosis not present

## 2023-12-07 DIAGNOSIS — M25562 Pain in left knee: Secondary | ICD-10-CM

## 2023-12-07 DIAGNOSIS — M79605 Pain in left leg: Secondary | ICD-10-CM

## 2023-12-07 DIAGNOSIS — M1612 Unilateral primary osteoarthritis, left hip: Secondary | ICD-10-CM | POA: Insufficient documentation

## 2023-12-07 DIAGNOSIS — M5442 Lumbago with sciatica, left side: Secondary | ICD-10-CM

## 2023-12-07 MED ORDER — LIDOCAINE HCL 1 % IJ SOLN
3.0000 mL | INTRAMUSCULAR | Status: AC | PRN
  Administered 2023-12-07: 3 mL

## 2023-12-07 MED ORDER — METHYLPREDNISOLONE ACETATE 40 MG/ML IJ SUSP
40.0000 mg | INTRAMUSCULAR | Status: AC | PRN
  Administered 2023-12-07: 40 mg via INTRA_ARTICULAR

## 2023-12-07 MED ORDER — CELECOXIB 200 MG PO CAPS: 200.0000 mg | ORAL_CAPSULE | Freq: Two times a day (BID) | ORAL | 1 refills | Status: DC | PRN

## 2023-12-07 MED ORDER — CYCLOBENZAPRINE HCL 10 MG PO TABS: 10.0000 mg | ORAL_TABLET | Freq: Three times a day (TID) | ORAL | 1 refills | Status: AC | PRN

## 2023-12-07 NOTE — Progress Notes (Signed)
 The patient is someone I have not seen since 2023.  She comes in today with a MRI that her primary care physician was obtained of her left knee.  She continues to have significant left knee pain and she has had at least 2 steroid injections at left knee but she points to the anterior aspect of her knee as a source of her pain.  She does ambulate with a rolling walker.  She also has been dealing with left-sided sciatica and some left hip pain.  We did have x-rays of her left knee and her left hip back in 2023.  The left hip showed some arthritic changes in the left knee also had some calcifications around the medial lateral meniscus.  On my exam today the left knee hurts only over the patella tendon and the anterior aspect of her knee.  However she has a lot of groin pain with internal and external rotation of her left hip and there is stiffness with rotation.  This is causing some back pain and sciatica as well.   An AP pelvis and lateral of the left hip shows now bone-on-bone wear of the left hip with complete loss of the joint space.  There are sclerotic and cystic changes as well as osteophytes and the arthritis is quite severe and is advanced when compared to x-rays from 2 years ago.  X-rays of her lumbar spine does show degenerative disc disease at several levels but no malalignment.  I do believe that the source of her left knee pain is mainly coming from her left hip and the compensation she has been dealing with walking different issues been having issues with that left hip.  It is now bone-on-bone arthritis.  I did offer steroid injection in her left knee today and she agreed to this and tolerated it well.  I would like to see her back in just 1 month for a weight and BMI calculation.  She is on weight loss medication now.  If she is lost even just a few pounds I would go ahead and work on setting her up for a left hip replacement.  I did give her hand about hip replacement surgery as well.  Again  in 4 weeks for now would like a repeat weight and BMI calculation and then we can work on scheduling her for a left hip replacement.  She agrees with the treatment plan.    Procedure Note  Patient: Tina Hinton             Date of Birth: 11-09-1955           MRN: [id number]             Visit Date: 12/07/2023  Procedures: Visit Diagnoses:  1. Chronic pain of left knee   2. Pain in left leg   3. Unilateral primary osteoarthritis, left hip   4. Chronic left-sided low back pain with left-sided sciatica     Large Joint Inj: L knee on 12/07/2023 5:44 PM Indications: diagnostic evaluation and pain Details: 22 G 1.5 in needle, superolateral approach  Arthrogram: No  Medications: 3 mL lidocaine  1 %; 40 mg methylPREDNISolone  acetate 40 MG/ML Outcome: tolerated well, no immediate complications Procedure, treatment alternatives, risks and benefits explained, specific risks discussed. Consent was given by the patient. Immediately prior to procedure a time out was called to verify the correct patient, procedure, equipment, support staff and site/side marked as required. Patient was prepped and draped in the usual sterile fashion.

## 2023-12-09 LAB — HM DIABETES EYE EXAM

## 2024-01-06 ENCOUNTER — Encounter: Payer: Self-pay | Admitting: Orthopaedic Surgery

## 2024-01-06 ENCOUNTER — Ambulatory Visit: Admitting: Orthopaedic Surgery

## 2024-01-06 VITALS — Ht 61.0 in | Wt 215.0 lb

## 2024-01-06 DIAGNOSIS — M1612 Unilateral primary osteoarthritis, left hip: Secondary | ICD-10-CM

## 2024-01-06 NOTE — Progress Notes (Signed)
 The patient is well-known to me.  She is a 68 year old female from a healthcare provider who has well-known debilitating arthritis involving her left hip.  Her x-rays at her last visit showed the arthrosis significantly worsened from previous films of 2023 and now it is bone-on-bone wear.  She has tried conservative treatment including activity modification and weight loss.  Her BMI today is down to 40.62 so she is lost weight even over the last 4 weeks when we saw her last.  We have placed a steroid injection in her left knee that did help some.  She is ready to proceed with hip replacement surgery.  We had a long and thorough discussion about her replacement surgery.  We discussed what to expect from an intraoperative and postoperative standpoint and discussed the risks benefits of the surgery.  We would have her stop her weight loss medication at least a week prior to surgery.  On exam today her left hip is still significantly painful with internal and external rotation and I did look at the x-rays again showing bone-on-bone wear of her left hip which has significantly worsened with complete loss of the joint space from films from 2023.  We will work on getting her scheduled for hip replacement in September.  We will be in touch with scheduling that surgery.  All question concerns were answered and addressed.

## 2024-01-22 ENCOUNTER — Telehealth: Payer: Self-pay

## 2024-01-22 NOTE — Telephone Encounter (Signed)
 I called patient to discuss scheduling THA.  Left voice mail message for return call.

## 2024-01-22 NOTE — Telephone Encounter (Signed)
 Patient aware note was written

## 2024-01-22 NOTE — Telephone Encounter (Signed)
 Planning surgery on 02/26/24.  Will you please provide her with an out of work note to cover at least the 1st 2 weeks post op?

## 2024-02-04 ENCOUNTER — Encounter: Payer: Self-pay | Admitting: Family Medicine

## 2024-02-10 ENCOUNTER — Other Ambulatory Visit: Payer: Self-pay | Admitting: Physician Assistant

## 2024-02-10 DIAGNOSIS — Z01818 Encounter for other preprocedural examination: Secondary | ICD-10-CM

## 2024-02-15 NOTE — Progress Notes (Signed)
 COVID Vaccine received:  []  No [x]  Yes Date of any COVID positive Test in last 90 days: no PCP - Jamee Antonio Meth DO Cardiologist - Dr. Redell Shallow  Chest x-ray -  EKG -   Stress Test -  ECHO - 11/12/22 Epic Cardiac Cath -   Bowel Prep - [x]  No  []   Yes ______  Pacemaker / ICD device [x]  No []  Yes   Spinal Cord Stimulator:[x]  No []  Yes       History of Sleep Apnea? [x]  No []  Yes   CPAP used?- [x]  No []  Yes    Does the patient monitor blood sugar?          [x]  No []  Yes  []  N/A  Patient has: []  NO Hx DM   [x]  Pre-DM                 []  DM1  []   DM2 Does patient have a [employer] or Dexacom? []  No []  Yes   Fasting Blood Sugar Ranges-  Checks Blood Sugar _____ times a day  GLP1 agonist / usual dose - Wegovy last dose 02/14/24 GLP1 instructions:  SGLT-2 inhibitors / usual dose - no SGLT-2 instructions:   Blood Thinner / Instructions:no Aspirin Instructions:ASA 81 mg will stop 5 days prior to surgery.  Comments:   Activity level: Patient is  unable to climb a flight of stairs without difficulty; [x]  No CP  [x]  No SOB, but would have _hip dysfunction__   Patient can perform ADLs without assistance.   Anesthesia review: SVT, asthma, prediabetic  Patient denies shortness of breath, fever, cough and chest pain at PAT appointment.  Patient verbalized understanding and agreement to the Pre-Surgical Instructions that were given to them at this PAT appointment. Patient was also educated of the need to review these PAT instructions again prior to his/her surgery.I reviewed the appropriate phone numbers to call if they have any and questions or concerns.

## 2024-02-15 NOTE — Patient Instructions (Signed)
 SURGICAL WAITING ROOM VISITATION  Patients having surgery or a procedure may have no more than 2 support people in the waiting area - these visitors may rotate.    Children under the age of 28 must have an adult with them who is not the patient.  Visitors with respiratory illnesses are discouraged from visiting and should remain at home.  If the patient needs to stay at the hospital during part of their recovery, the visitor guidelines for inpatient rooms apply. Pre-op nurse will coordinate an appropriate time for 1 support person to accompany patient in pre-op.  This support person may not rotate.    Please refer to the [facility] website for the visitor guidelines for Inpatients (after your surgery is over and you are in a regular room).       Your procedure is scheduled on: 02/26/24   Report to [facility] Main Entrance    Report to admitting at 8:30 AM   Call this number if you have problems the morning of surgery [phone]   Do not eat food :After Midnight.   After Midnight you may have the following liquids until  8 AM DAY OF SURGERY  Water Non-Citrus Juices (without pulp, NO RED-Apple, White grape, White cranberry) Black Coffee (NO MILK/CREAM OR CREAMERS, sugar ok)  Clear Tea (NO MILK/CREAM OR CREAMERS, sugar ok) regular and decaf                             Plain Jell-O (NO RED)                                           Fruit ices (not with fruit pulp, NO RED)                                     Popsicles (NO RED)                                                               Sports drinks like Gatorade (NO RED)                The day of surgery:  Drink ONE (1) Pre-Surgery  G2 at 8 AM the morning of surgery. Drink in one sitting. Do not sip.  This drink was given to you during your hospital  pre-op appointment visit. Nothing else to drink after completing the  Pre-Surgery  G2.          Oral Hygiene is also important to reduce your risk of infection.                                     Remember - BRUSH YOUR TEETH THE MORNING OF SURGERY WITH YOUR REGULAR TOOTHPASTE   Stop all vitamins and herbal supplements 7 days before surgery.   Take these medicines the morning of surgery with A SIP OF WATER: Fenofibrate , Prevacid, loratadine(claritin), Paroxetine (Paxil ), Ativan  if needed, inhaler.  You may not have any metal on your body including hair pins, jewelry, and body piercing             Do not wear make-up, lotions, powders, perfumes/cologne, or deodorant  Do not wear nail polish including gel and S&S, artificial/acrylic nails, or any other type of covering on natural nails including finger and toenails. If you have artificial nails, gel coating, etc. that needs to be removed by a nail salon please have this removed prior to surgery or surgery may need to be canceled/ delayed if the surgeon/ anesthesia feels like they are unable to be safely monitored.   Do not shave  48 hours prior to surgery.    Do not bring valuables to the hospital. [facility] IS NOT             RESPONSIBLE   FOR VALUABLES.   Contacts, glasses, dentures or bridgework may not be worn into surgery.   Bring small overnight bag day of surgery.   DO NOT BRING YOUR HOME MEDICATIONS TO THE HOSPITAL. PHARMACY WILL DISPENSE MEDICATIONS LISTED ON YOUR MEDICATION LIST TO YOU DURING YOUR ADMISSION IN THE HOSPITAL!    Patients discharged on the day of surgery will not be allowed to drive home.  Someone NEEDS to stay with you for the first 24 hours after anesthesia.   Special Instructions: Bring a copy of your healthcare power of attorney and living will documents the day of surgery if you haven't scanned them before.              Please read over the following fact sheets you were given: IF YOU HAVE QUESTIONS ABOUT YOUR PRE-OP INSTRUCTIONS PLEASE CALL [phone] Tina Hinton   If you received a COVID test during your pre-op visit  it is requested that you wear a mask when out in  public, stay away from anyone that may not be feeling well and notify your surgeon if you develop symptoms. If you test positive for Covid or have been in contact with anyone that has tested positive in the last 10 days please notify you surgeon.      Pre-operative 5 CHG Bath Instructions   You can play a key role in reducing the risk of infection after surgery. Your skin needs to be as free of germs as possible. You can reduce the number of germs on your skin by washing with CHG (chlorhexidine gluconate) soap before surgery. CHG is an antiseptic soap that kills germs and continues to kill germs even after washing.   DO NOT use if you have an allergy to chlorhexidine/CHG or antibacterial soaps. If your skin becomes reddened or irritated, stop using the CHG and notify one of our RNs at [phone].   Please shower with the CHG soap starting 4 days before surgery using the following schedule:     Please keep in mind the following:  DO NOT shave, including legs and underarms, starting the day of your first shower.   You may shave your face at any point before/day of surgery.  Place clean sheets on your bed the day you start using CHG soap. Use a clean washcloth (not used since being washed) for each shower. DO NOT sleep with pets once you start using the CHG.   CHG Shower Instructions:  If you choose to wash your hair and private area, wash first with your normal shampoo/soap.  After you use shampoo/soap, rinse your hair and body thoroughly to remove shampoo/soap residue.  Turn the  water OFF and apply about 3 tablespoons (45 ml) of CHG soap to a CLEAN washcloth.  Apply CHG soap ONLY FROM YOUR NECK DOWN TO YOUR TOES (washing for 3-5 minutes)  DO NOT use CHG soap on face, private areas, open wounds, or sores.  Pay special attention to the area where your surgery is being performed.  If you are having back surgery, having someone wash your back for you may be helpful. Wait 2 minutes after CHG  soap is applied, then you may rinse off the CHG soap.  Pat dry with a clean towel  Put on clean clothes/pajamas   If you choose to wear lotion, please use ONLY the CHG-compatible lotions on the back of this paper.     Additional instructions for the day of surgery: DO NOT APPLY any lotions, deodorants, cologne, or perfumes.   Put on clean/comfortable clothes.  Brush your teeth.  Ask your nurse before applying any prescription medications to the skin.      CHG Compatible Lotions   Aveeno Moisturizing lotion  Cetaphil Moisturizing Cream  Cetaphil Moisturizing Lotion  Clairol Herbal Essence Moisturizing Lotion, Dry Skin  Clairol Herbal Essence Moisturizing Lotion, Extra Dry Skin  Clairol Herbal Essence Moisturizing Lotion, Normal Skin  Curel Age Defying Therapeutic Moisturizing Lotion with Alpha Hydroxy  Curel Extreme Care Body Lotion  Curel Soothing Hands Moisturizing Hand Lotion  Curel Therapeutic Moisturizing Cream, Fragrance-Free  Curel Therapeutic Moisturizing Lotion, Fragrance-Free  Curel Therapeutic Moisturizing Lotion, Original Formula  Eucerin Daily Replenishing Lotion  Eucerin Dry Skin Therapy Plus Alpha Hydroxy Crme  Eucerin Dry Skin Therapy Plus Alpha Hydroxy Lotion  Eucerin Original Crme  Eucerin Original Lotion  Eucerin Plus Crme Eucerin Plus Lotion  Eucerin TriLipid Replenishing Lotion  Keri Anti-Bacterial Hand Lotion  Keri Deep Conditioning Original Lotion Dry Skin Formula Softly Scented  Keri Deep Conditioning Original Lotion, Fragrance Free Sensitive Skin Formula  Keri Lotion Fast Absorbing Fragrance Free Sensitive Skin Formula  Keri Lotion Fast Absorbing Softly Scented Dry Skin Formula  Keri Original Lotion  Keri Skin Renewal Lotion Keri Silky Smooth Lotion  Keri Silky Smooth Sensitive Skin Lotion  Nivea Body Creamy Conditioning Oil  Nivea Body Extra Enriched Lotion  Nivea Body Original Lotion  Nivea Body Sheer Moisturizing Lotion Nivea Crme  Nivea  Skin Firming Lotion  NutraDerm 30 Skin Lotion  NutraDerm Skin Lotion  NutraDerm Therapeutic Skin Cream  NutraDerm Therapeutic Skin Lotion  ProShield Protective Hand Cream  Incentive Spirometer (Watch this video at home: [email or web address])  An incentive spirometer is a tool that can help keep your lungs clear and active. This tool measures how well you are filling your lungs with each breath. Taking long deep breaths may help reverse or decrease the chance of developing breathing (pulmonary) problems (especially infection) following: A long period of time when you are unable to move or be active. BEFORE THE PROCEDURE  If the spirometer includes an indicator to show your best effort, your nurse or respiratory therapist will set it to a desired goal. If possible, sit up straight or lean slightly forward. Try not to slouch. Hold the incentive spirometer in an upright position. INSTRUCTIONS FOR USE  Sit on the edge of your bed if possible, or sit up as far as you can in bed or on a chair. Hold the incentive spirometer in an upright position. Breathe out normally. Place the mouthpiece in your mouth and seal your lips tightly around it. Breathe in slowly and as deeply as possible, raising the  piston or the ball toward the top of the column. Hold your breath for 3-5 seconds or for as long as possible. Allow the piston or ball to fall to the bottom of the column. Remove the mouthpiece from your mouth and breathe out normally. Rest for a few seconds and repeat Steps 1 through 7 at least 10 times every 1-2 hours when you are awake. Take your time and take a few normal breaths between deep breaths. The spirometer may include an indicator to show your best effort. Use the indicator as a goal to work toward during each repetition. After each set of 10 deep breaths, practice coughing to be sure your lungs are clear. If you have an incision (the cut made at the time of surgery),  support your incision when coughing by placing a pillow or rolled up towels firmly against it. Once you are able to get out of bed, walk around indoors and cough well. You may stop using the incentive spirometer when instructed by your caregiver.  RISKS AND COMPLICATIONS Take your time so you do not get dizzy or light-headed. If you are in pain, you may need to take or ask for pain medication before doing incentive spirometry. It is harder to take a deep breath if you are having pain. AFTER USE Rest and breathe slowly and easily. It can be helpful to keep track of a log of your progress. Your caregiver can provide you with a simple table to help with this. If you are using the spirometer at home, follow these instructions: SEEK MEDICAL CARE IF:  You are having difficultly using the spirometer. You have trouble using the spirometer as often as instructed. Your pain medication is not giving enough relief while using the spirometer. You develop fever of 100.5 F (38.1 C) or higher. SEEK IMMEDIATE MEDICAL CARE IF:  You cough up bloody sputum that had not been present before. You develop fever of 102 F (38.9 C) or greater. You develop worsening pain at or near the incision site. MAKE SURE YOU:  Understand these instructions. Will watch your condition. Will get help right away if you are not doing well or get worse. Document Released: 10/06/2006 Document Revised: 08/18/2011 Document Reviewed: 12/07/2006   WHAT IS A BLOOD TRANSFUSION? Blood Transfusion Information  A transfusion is the replacement of blood or some of its parts. Blood is made up of multiple cells which provide different functions. Red blood cells carry oxygen and are used for blood loss replacement. White blood cells fight against infection. Platelets control bleeding. Plasma helps clot blood. Other blood products are available for specialized needs, such as hemophilia or other clotting disorders. BEFORE THE TRANSFUSION   Who gives blood for transfusions?  Healthy volunteers who are fully evaluated to make sure their blood is safe. This is blood bank blood. Transfusion therapy is the safest it has ever been in the practice of medicine. Before blood is taken from a donor, a complete history is taken to make sure that person has no history of diseases nor engages in risky social behavior (examples are intravenous drug use or sexual activity with multiple partners). The donor's travel history is screened to minimize risk of transmitting infections, such as malaria. The donated blood is tested for signs of infectious diseases, such as HIV and hepatitis. The blood is then tested to be sure it is compatible with you in order to minimize the chance of a transfusion reaction. If you or a relative donates blood, this is  often done in anticipation of surgery and is not appropriate for emergency situations. It takes many days to process the donated blood. RISKS AND COMPLICATIONS Although transfusion therapy is very safe and saves many lives, the main dangers of transfusion include:  Getting an infectious disease. Developing a transfusion reaction. This is an allergic reaction to something in the blood you were given. Every precaution is taken to prevent this. The decision to have a blood transfusion has been considered carefully by your caregiver before blood is given. Blood is not given unless the benefits outweigh the risks. AFTER THE TRANSFUSION Right after receiving a blood transfusion, you will usually feel much better and more energetic. This is especially true if your red blood cells have gotten low (anemic). The transfusion raises the level of the red blood cells which carry oxygen, and this usually causes an energy increase. The nurse administering the transfusion will monitor you carefully for complications. HOME CARE INSTRUCTIONS  No special instructions are needed after a transfusion. You may find your energy is  better. Speak with your caregiver about any limitations on activity for underlying diseases you may have. SEEK MEDICAL CARE IF:  Your condition is not improving after your transfusion. You develop redness or irritation at the intravenous (IV) site. SEEK IMMEDIATE MEDICAL CARE IF:  Any of the following symptoms occur over the next 12 hours: Shaking chills. You have a temperature by mouth above 102 F (38.9 C), not controlled by medicine. Chest, back, or muscle pain. People around you feel you are not acting correctly or are confused. Shortness of breath or difficulty breathing. Dizziness and fainting. You get a rash or develop hives. You have a decrease in urine output. Your urine turns a dark color or changes to pink, red, or brown. Any of the following symptoms occur over the next 10 days: You have a temperature by mouth above 102 F (38.9 C), not controlled by medicine. Shortness of breath. Weakness after normal activity. The white part of the eye turns yellow (jaundice). You have a decrease in the amount of urine or are urinating less often. Your urine turns a dark color or changes to pink, red, or brown. Document Released: 05/23/2000 Document Revised: 08/18/2011 Document Reviewed: 01/10/2008 [facility] Patient Information 2014 [place], [place].

## 2024-02-16 ENCOUNTER — Other Ambulatory Visit: Payer: Self-pay

## 2024-02-16 ENCOUNTER — Encounter (HOSPITAL_COMMUNITY)
Admission: RE | Admit: 2024-02-16 | Discharge: 2024-02-16 | Disposition: A | Discharge status: Home / Self Care | Source: Ambulatory Visit | Attending: Orthopaedic Surgery | Admitting: Orthopaedic Surgery

## 2024-02-16 ENCOUNTER — Encounter (HOSPITAL_COMMUNITY): Payer: Self-pay | Admitting: *Deleted

## 2024-02-16 VITALS — BP 143/94 | HR 97 | Temp 98.0°F | Resp 18 | Ht 61.0 in | Wt 209.0 lb

## 2024-02-16 DIAGNOSIS — K219 Gastro-esophageal reflux disease without esophagitis: Secondary | ICD-10-CM | POA: Diagnosis not present

## 2024-02-16 DIAGNOSIS — Z01818 Encounter for other preprocedural examination: Secondary | ICD-10-CM

## 2024-02-16 DIAGNOSIS — F32A Depression, unspecified: Secondary | ICD-10-CM | POA: Diagnosis not present

## 2024-02-16 DIAGNOSIS — Z01812 Encounter for preprocedural laboratory examination: Secondary | ICD-10-CM | POA: Insufficient documentation

## 2024-02-16 DIAGNOSIS — E669 Obesity, unspecified: Secondary | ICD-10-CM | POA: Diagnosis not present

## 2024-02-16 DIAGNOSIS — F419 Anxiety disorder, unspecified: Secondary | ICD-10-CM | POA: Diagnosis not present

## 2024-02-16 DIAGNOSIS — M1612 Unilateral primary osteoarthritis, left hip: Secondary | ICD-10-CM | POA: Diagnosis not present

## 2024-02-16 DIAGNOSIS — Z6839 Body mass index (BMI) 39.0-39.9, adult: Secondary | ICD-10-CM | POA: Diagnosis not present

## 2024-02-16 DIAGNOSIS — J45909 Unspecified asthma, uncomplicated: Secondary | ICD-10-CM | POA: Insufficient documentation

## 2024-02-16 HISTORY — DX: Other complications of anesthesia, initial encounter: T88.59XA

## 2024-02-16 HISTORY — DX: Cardiac arrhythmia, unspecified: I49.9

## 2024-02-16 HISTORY — DX: Personal history of other diseases of the digestive system: Z87.19

## 2024-02-16 HISTORY — DX: Malignant (primary) neoplasm, unspecified: C80.1

## 2024-02-16 LAB — BASIC METABOLIC PANEL WITH GFR
Anion gap: 10 (ref 5–15)
BUN: 22 mg/dL (ref 8–23)
CO2: 25 mmol/L (ref 22–32)
Calcium: 9.7 mg/dL (ref 8.9–10.3)
Chloride: 103 mmol/L (ref 98–111)
Creatinine, Ser: 0.71 mg/dL (ref 0.44–1.00)
GFR, Estimated: >60 mL/min (ref >60)
Glucose, Bld: 97 mg/dL (ref 70–99)
Potassium: 5.3 mmol/L — ABNORMAL HIGH (ref 3.5–5.1)
Sodium: 139 mmol/L (ref 135–145)

## 2024-02-16 LAB — CBC
HCT: 43.1 % (ref 36.0–46.0)
Hemoglobin: 13.5 g/dL (ref 12.0–15.0)
MCH: 28.5 pg (ref 26.0–34.0)
MCHC: 31.3 g/dL (ref 30.0–36.0)
MCV: 90.9 fL (ref 80.0–100.0)
Platelets: 307 K/uL (ref 150–400)
RBC: 4.74 MIL/uL (ref 3.87–5.11)
RDW: 14.5 % (ref 11.5–15.5)
WBC: 9.3 K/uL (ref 4.0–10.5)
nRBC: 0 % (ref 0.0–0.2)

## 2024-02-16 LAB — SURGICAL PCR SCREEN
MRSA, PCR: NEGATIVE
Staphylococcus aureus: NEGATIVE

## 2024-02-16 NOTE — Anesthesia Preprocedure Evaluation (Addendum)
 Anesthesia Evaluation  Patient identified by MRN, date of birth, ID band Patient awake    Reviewed: Allergy & Precautions, NPO status , Patient's Chart, lab work & pertinent test results, reviewed documented beta blocker date and time   History of Anesthesia Complications Negative for: history of anesthetic complications  Airway Mallampati: I       Dental no notable dental hx. (+) Dental Advisory Given   Pulmonary asthma    breath sounds clear to auscultation       Cardiovascular + dysrhythmias (SVT s/p Adenosine (> 4 years ago)) Supra Ventricular Tachycardia  Rhythm:Regular Rate:Normal     Neuro/Psych  PSYCHIATRIC DISORDERS (Benzodiazepene use) Anxiety Depression    Dysphagia    GI/Hepatic hiatal hernia,GERD  ,,  Endo/Other    Renal/GU      Musculoskeletal  (+) Arthritis ,    Abdominal   Peds  Hematology   Anesthesia Other Findings   Reproductive/Obstetrics                              Anesthesia Physical Anesthesia Plan  ASA: 3  Anesthesia Plan: Spinal   Post-op Pain Management:    Induction:   PONV Risk Score and Plan: 1  Airway Management Planned: Simple Face Mask  Additional Equipment:   Intra-op Plan:   Post-operative Plan:   Informed Consent:      Dental advisory given  Plan Discussed with: CRNA and Surgeon  Anesthesia Plan Comments: (See PAT note from 9/9. Pt reports anxiety regarding spinal anesthesia due to failed epidural x 2 in the past. Advised to have conversation of spinal vs general DOS.  Discussed neuraxial vs general anesthesia. Plan for neuraxial anesthesia via spinal. )         Anesthesia Quick Evaluation

## 2024-02-16 NOTE — Progress Notes (Signed)
 Case: [id number] Date/Time: 02/26/24 1045   Procedure: ARTHROPLASTY, HIP, TOTAL, ANTERIOR APPROACH (Left: Hip)   Anesthesia type: Spinal   Diagnosis: Primary osteoarthritis of left hip [M16.12]   Pre-op diagnosis: osteoarthritis left hip   Location: [facility] ROOM 10 / [facility]   Surgeons: Vernetta Lonni GRADE, MD       DISCUSSION: Tina Hinton is a 68 yo female with PMH of SVT, asthma, GERD, arthritis, anxiety, depression, obesity (BMI 39)  Prior complications from anesthesia includes 2 failed epidurals with childbirth in the past. She reports severe HA after her first child requiring a blood patch and another epidural failed and she states she felt everything. Pt reports being highly anxious regarding spinal anesthesia. Her preference is general but she is open to conversation DOS regarding anesthesia route.  Seen by PCP on 10/15/23. All issues stable.   Hx of SVT. Previously evaluated by Cardiology in 10/2022. Echo was obtained and showed normal LVEF with grade I DD. Previously prescribed Metoprolol  but no longer taking.  LD Wegovy: 9/7  VS: BP (!) 143/94   Pulse 97   Temp 36.7 C (Oral)   Resp 18   Ht 5' 1 (1.549 m)   Wt 94.8 kg   SpO2 97%   BMI 39.49 kg/m   PROVIDERS: Antonio Cyndee Jamee JONELLE, DO   LABS: Labs reviewed: Acceptable for surgery. (all labs ordered are listed, but only abnormal results are displayed)  Labs Reviewed  SURGICAL PCR SCREEN  CBC  BASIC METABOLIC PANEL WITH GFR  TYPE AND SCREEN     IMAGES:   EKG:   CV:  Echo 11/12/22:  IMPRESSIONS    1. Left ventricular ejection fraction, by estimation, is 55 to 60%. The left ventricle has normal function. Left ventricular endocardial border not optimally defined to evaluate regional wall motion. There is mild concentric left ventricular hypertrophy. Left ventricular diastolic parameters are consistent with Grade I diastolic dysfunction (impaired relaxation).  2. Normal TAPSE. Right ventricular  systolic function was not well visualized. The right ventricular size is not well visualized. Tricuspid regurgitation signal is inadequate for assessing PA pressure.  3. The mitral valve is normal in structure. No evidence of mitral valve regurgitation. No evidence of mitral stenosis.  4. The aortic valve is tricuspid. Aortic valve regurgitation is not visualized. No aortic stenosis is present.  5. The inferior vena cava is normal in size with greater than 50% respiratory variability, suggesting right atrial pressure of 3 mmHg. Past Medical History:  Diagnosis Date   Ankle fracture    Right   Anxiety    Arthritis    hands, knees   Asthma    Bilateral knee pain    Cancer (HCC)    skin cancer   Chest pain    Chicken pox    Clavicle fracture    Left   Complication of anesthesia    Depression    Dysrhythmia    Foot fracture, left    GERD (gastroesophageal reflux disease)    History of hiatal hernia    Hyperlipidemia    IBS (irritable bowel syndrome) 01/19/2017   Joint pain    Low back pain    Osteoarthritis    Other fatigue    Palpitations    Panic attacks    Seasonal allergies    Shortness of breath on exertion    SVT (supraventricular tachycardia) (HCC)    Swallowing difficulty    Uterine polyp    ablation    Past Surgical History:  Procedure Laterality Date   BIOSPY     HEAD   CESAREAN SECTION     COLONOSCOPY     ENDOMETRIAL ABLATION     MANDIBLE SURGERY     TONSILLECTOMY     TUBAL LIGATION     UPPER GASTROINTESTINAL ENDOSCOPY      MEDICATIONS:  aspirin 81 MG chewable tablet   beclomethasone (QVAR  REDIHALER) 40 MCG/ACT inhaler   cyclobenzaprine  (FLEXERIL ) 10 MG tablet   diclofenac  (VOLTAREN ) 75 MG EC tablet   fenofibrate  160 MG tablet   lansoprazole (PREVACID) 30 MG capsule   loratadine (CLARITIN) 10 MG tablet   LORazepam  (ATIVAN ) 0.5 MG tablet   MAGNESIUM PO   Multiple Vitamin (MULTIVITAMIN WITH MINERALS) TABS tablet   PARoxetine  (PAXIL ) 10 MG  tablet   rosuvastatin  (CRESTOR ) 10 MG tablet   SEMAGLUTIDE [place]   Vitamin D , Ergocalciferol , (DRISDOL ) 1.25 MG (50000 UNIT) CAPS capsule   No current facility-administered medications for this encounter.   Burnard CHRISTELLA Odis DEVONNA [facility] Short Stay/Anesthesiology [facility] Phone [phone] 02/16/2024 2:41 PM

## 2024-02-25 NOTE — H&P (Signed)
 TOTAL HIP ADMISSION H&P  Patient is admitted for left total hip arthroplasty.  Subjective:  Chief Complaint: left hip pain  HPI: Tina Hinton, 68 y.o. female, has a history of pain and functional disability in the left hip(s) due to arthritis and patient has failed non-surgical conservative treatments for greater than 12 weeks to include NSAID's and/or analgesics, corticosteriod injections, use of assistive devices, weight reduction as appropriate, and activity modification.  Onset of symptoms was gradual starting several years ago with gradually worsening course since that time.The patient noted no past surgery on the left hip(s).  Patient currently rates pain in the left hip at 10 out of 10 with activity. Patient has night pain, worsening of pain with activity and weight bearing, trendelenberg gait, pain that interfers with activities of daily living, and pain with passive range of motion. Patient has evidence of subchondral cysts, subchondral sclerosis, periarticular osteophytes, and joint space narrowing by imaging studies. This condition presents safety issues increasing the risk of falls.  There is no current active infection.  Patient Active Problem List   Diagnosis Date Noted   Unilateral primary osteoarthritis, left hip 12/07/2023   Claustrophobia 10/15/2023   Vitamin D  deficiency 06/25/2021   Class 3 severe obesity with serious comorbidity and body mass index (BMI) of 45.0 to 49.9 in adult 03/12/2021   Prediabetes 03/12/2021   Lumbar strain, subsequent encounter 02/14/2021   Asthma    Vertigo 01/31/2020   Aortic atherosclerosis (HCC) 02/15/2018   IBS (irritable bowel syndrome) - D 01/19/2017   Primary osteoarthritis of both knees 01/19/2017   Preventative health care 07/30/2015   Obesity, unspecified 07/30/2015   Anxiety 06/25/2015   Hyperlipidemia LDL goal <100 06/25/2015   Sprain of medial collateral ligament of left knee 06/25/2015   Osteoarthritis of both knees  06/25/2015   Degeneration of intervertebral disc of lumbar region 01/04/2015   Spondylolisthesis at L5-S1 level 01/04/2015   Fracture of lateral malleolus 11/14/2014   Foot, fracture, navicular 11/14/2014   Past Medical History:  Diagnosis Date   Ankle fracture    Right   Anxiety    Arthritis    hands, knees   Asthma    Bilateral knee pain    Cancer (HCC)    skin cancer   Chest pain    Chicken pox    Clavicle fracture    Left   Complication of anesthesia    Depression    Dysrhythmia    Foot fracture, left    GERD (gastroesophageal reflux disease)    History of hiatal hernia    Hyperlipidemia    IBS (irritable bowel syndrome) 01/19/2017   Joint pain    Low back pain    Osteoarthritis    Other fatigue    Palpitations    Panic attacks    Seasonal allergies    Shortness of breath on exertion    SVT (supraventricular tachycardia) (HCC)    Swallowing difficulty    Uterine polyp    ablation    Past Surgical History:  Procedure Laterality Date   BIOSPY     HEAD   CESAREAN SECTION     COLONOSCOPY     ENDOMETRIAL ABLATION     MANDIBLE SURGERY     TONSILLECTOMY     TUBAL LIGATION     UPPER GASTROINTESTINAL ENDOSCOPY      No current facility-administered medications for this encounter.   Current Outpatient Medications  Medication Sig Dispense Refill Last Dose/Taking   aspirin  81 MG chewable tablet  Chew 81 mg by mouth daily.   Taking   beclomethasone (QVAR  REDIHALER) 40 MCG/ACT inhaler Inhale 2 puffs into the lungs 2 (two) times daily. (Patient taking differently: Inhale 2 puffs into the lungs 2 (two) times daily as needed (shortness of breath).) 3 each 3 Taking Differently   cyclobenzaprine  (FLEXERIL ) 10 MG tablet Take 1 tablet (10 mg total) by mouth 3 (three) times daily as needed for muscle spasms. (Patient taking differently: Take 10 mg by mouth at bedtime as needed for muscle spasms.) 60 tablet 1 Taking Differently   diclofenac  (VOLTAREN ) 75 MG EC tablet TAKE 1  TABLET BY MOUTH TWICE A DAY 180 tablet 1 Taking   fenofibrate  160 MG tablet Take 1 tablet (160 mg total) by mouth daily. 90 tablet 3 Taking   lansoprazole (PREVACID) 30 MG capsule Take 30 mg by mouth 2 (two) times daily.   Taking   loratadine  (CLARITIN ) 10 MG tablet Take 10 mg by mouth daily.   Taking   LORazepam  (ATIVAN ) 0.5 MG tablet Take 1 tablet (0.5 mg total) by mouth every 8 (eight) hours. (Patient taking differently: Take 0.5 mg by mouth daily as needed for anxiety.) 30 tablet 0 Taking Differently   MAGNESIUM PO Take 1 tablet by mouth daily.   Taking   Multiple Vitamin (MULTIVITAMIN WITH MINERALS) TABS tablet Take 1 tablet by mouth daily.   Taking   PARoxetine  (PAXIL ) 10 MG tablet Take 1 tablet (10 mg total) by mouth daily. 30 tablet 2 Taking   rosuvastatin  (CRESTOR ) 10 MG tablet Take 10 mg by mouth daily.   Taking   SEMAGLUTIDE [place] Inject 40 Units into the skin every 7 (seven) days.   Taking   Vitamin D , Ergocalciferol , (DRISDOL ) 1.25 MG (50000 UNIT) CAPS capsule Take 1 capsule (50,000 Units total) by mouth every 7 (seven) days. 12 capsule 1 Taking   Allergies  Allergen Reactions   Other Rash    Entex LA   Phenylephrine -Guaifenesin Rash    Social History   Tobacco Use   Smoking status: Never   Smokeless tobacco: Never  Substance Use Topics   Alcohol use: Yes    Comment: rarely    Family History  Problem Relation Age of Onset   Obesity Mother    Cancer Mother    Arthritis Mother    Breast cancer Mother 53   Cancer Sister    Arthritis Maternal Grandmother    Heart disease Maternal Grandmother    Diabetes Maternal Grandmother    Breast cancer Paternal Grandmother    Prostate cancer Paternal Grandfather    Cancer Maternal Aunt    Breast cancer Maternal Aunt    Obesity Other    Colon cancer Neg Hx    Esophageal cancer Neg Hx    Rectal cancer Neg Hx    Stomach cancer Neg Hx      Review of Systems  Objective:  Physical Exam Vitals reviewed.  Constitutional:       Appearance: Normal appearance. She is obese.  HENT:     Head: Normocephalic and atraumatic.  Eyes:     Extraocular Movements: Extraocular movements intact.     Pupils: Pupils are equal, round, and reactive to light.  Cardiovascular:     Rate and Rhythm: Normal rate and regular rhythm.  Pulmonary:     Effort: Pulmonary effort is normal.     Breath sounds: Normal breath sounds.  Abdominal:     Palpations: Abdomen is soft.  Musculoskeletal:     Cervical back:  Normal range of motion and neck supple.     Left hip: Tenderness and bony tenderness present. Decreased range of motion. Decreased strength.  Neurological:     Mental Status: She is alert and oriented to person, place, and time.  Psychiatric:        Behavior: Behavior normal.     Vital signs in last 24 hours:    Labs:   Estimated body mass index is 39.49 kg/m as calculated from the following:   Height as of 02/16/24: 5' 1 (1.549 m).   Weight as of 02/16/24: 94.8 kg.   Imaging Review Plain radiographs demonstrate severe degenerative joint disease of the left hip(s). The bone quality appears to be good for age and reported activity level.      Assessment/Plan:  End stage arthritis, left hip(s)  The patient history, physical examination, clinical judgement of the provider and imaging studies are consistent with end stage degenerative joint disease of the left hip(s) and total hip arthroplasty is deemed medically necessary. The treatment options including medical management, injection therapy, arthroscopy and arthroplasty were discussed at length. The risks and benefits of total hip arthroplasty were presented and reviewed. The risks due to aseptic loosening, infection, stiffness, dislocation/subluxation,  thromboembolic complications and other imponderables were discussed.  The patient acknowledged the explanation, agreed to proceed with the plan and consent was signed. Patient is being admitted for inpatient treatment for  surgery, pain control, PT, OT, prophylactic antibiotics, VTE prophylaxis, progressive ambulation and ADL's and discharge planning.The patient is planning to be discharged home with home health services

## 2024-02-26 ENCOUNTER — Other Ambulatory Visit: Payer: Self-pay

## 2024-02-26 ENCOUNTER — Ambulatory Visit (HOSPITAL_COMMUNITY)

## 2024-02-26 ENCOUNTER — Observation Stay (HOSPITAL_COMMUNITY)

## 2024-02-26 ENCOUNTER — Encounter (HOSPITAL_COMMUNITY): Payer: Self-pay | Admitting: Orthopaedic Surgery

## 2024-02-26 ENCOUNTER — Observation Stay (HOSPITAL_COMMUNITY)
Admission: RE | Admit: 2024-02-26 | Discharge: 2024-02-28 | Disposition: A | Discharge status: Home / Self Care | Attending: Orthopaedic Surgery | Admitting: Orthopaedic Surgery

## 2024-02-26 ENCOUNTER — Ambulatory Visit (HOSPITAL_COMMUNITY): Payer: Self-pay | Admitting: Medical

## 2024-02-26 ENCOUNTER — Encounter (HOSPITAL_COMMUNITY)
Admission: RE | Disposition: A | Discharge status: Home / Self Care | Payer: Self-pay | Source: Home / Self Care | Attending: Orthopaedic Surgery

## 2024-02-26 DIAGNOSIS — M1612 Unilateral primary osteoarthritis, left hip: Secondary | ICD-10-CM | POA: Diagnosis not present

## 2024-02-26 DIAGNOSIS — J45909 Unspecified asthma, uncomplicated: Secondary | ICD-10-CM | POA: Insufficient documentation

## 2024-02-26 DIAGNOSIS — Z7982 Long term (current) use of aspirin: Secondary | ICD-10-CM | POA: Insufficient documentation

## 2024-02-26 DIAGNOSIS — Z96642 Presence of left artificial hip joint: Secondary | ICD-10-CM

## 2024-02-26 DIAGNOSIS — Z85828 Personal history of other malignant neoplasm of skin: Secondary | ICD-10-CM | POA: Insufficient documentation

## 2024-02-26 DIAGNOSIS — M25552 Pain in left hip: Secondary | ICD-10-CM | POA: Diagnosis present

## 2024-02-26 HISTORY — PX: TOTAL HIP ARTHROPLASTY: SHX124

## 2024-02-26 LAB — ABO/RH: ABO/RH(D): A NEG

## 2024-02-26 LAB — TYPE AND SCREEN
ABO/RH(D): A NEG
Antibody Screen: NEGATIVE

## 2024-02-26 SURGERY — ARTHROPLASTY, HIP, TOTAL, ANTERIOR APPROACH
Anesthesia: Spinal | Site: Hip | Laterality: Left

## 2024-02-26 MED ORDER — MIDAZOLAM HCL 2 MG/2ML IJ SOLN
INTRAMUSCULAR | Status: AC
  Filled 2024-02-26: qty 2

## 2024-02-26 MED ORDER — 0.9 % SODIUM CHLORIDE (POUR BTL) OPTIME
TOPICAL | Status: DC | PRN
  Administered 2024-02-26: 1000 mL

## 2024-02-26 MED ORDER — ONDANSETRON HCL 4 MG PO TABS: 4.0000 mg | ORAL_TABLET | Freq: Four times a day (QID) | ORAL | Status: DC | PRN

## 2024-02-26 MED ORDER — PANTOPRAZOLE SODIUM 40 MG PO TBEC
40.0000 mg | DELAYED_RELEASE_TABLET | Freq: Every day | ORAL | Status: DC
  Administered 2024-02-26 – 2024-02-28 (×3): 40 mg via ORAL
  Filled 2024-02-26 (×3): qty 1

## 2024-02-26 MED ORDER — ONDANSETRON HCL 4 MG/2ML IJ SOLN: 4.0000 mg | Freq: Once | INTRAMUSCULAR | Status: DC | PRN

## 2024-02-26 MED ORDER — ONDANSETRON HCL 4 MG/2ML IJ SOLN
4.0000 mg | Freq: Four times a day (QID) | INTRAMUSCULAR | Status: DC | PRN
  Administered 2024-02-27: 4 mg via INTRAVENOUS
  Filled 2024-02-26: qty 2

## 2024-02-26 MED ORDER — ROSUVASTATIN CALCIUM 10 MG PO TABS
10.0000 mg | ORAL_TABLET | Freq: Every day | ORAL | Status: DC
  Administered 2024-02-26 – 2024-02-28 (×3): 10 mg via ORAL
  Filled 2024-02-26 (×3): qty 1

## 2024-02-26 MED ORDER — OXYCODONE HCL 5 MG PO TABS
5.0000 mg | ORAL_TABLET | Freq: Once | ORAL | Status: AC | PRN
  Administered 2024-02-26: 5 mg via ORAL

## 2024-02-26 MED ORDER — FENOFIBRATE 160 MG PO TABS
160.0000 mg | ORAL_TABLET | Freq: Every day | ORAL | Status: DC
  Administered 2024-02-27 – 2024-02-28 (×2): 160 mg via ORAL
  Filled 2024-02-26 (×2): qty 1

## 2024-02-26 MED ORDER — FENTANYL CITRATE (PF) 100 MCG/2ML IJ SOLN
INTRAMUSCULAR | Status: DC | PRN
  Administered 2024-02-26 (×2): 50 ug via INTRAVENOUS

## 2024-02-26 MED ORDER — PHENYLEPHRINE 80 MCG/ML (10ML) SYRINGE FOR IV PUSH (FOR BLOOD PRESSURE SUPPORT)
PREFILLED_SYRINGE | INTRAVENOUS | Status: AC
  Filled 2024-02-26: qty 10

## 2024-02-26 MED ORDER — HYDROMORPHONE HCL 1 MG/ML IJ SOLN
0.5000 mg | INTRAMUSCULAR | Status: DC | PRN
  Administered 2024-02-26: 0.5 mg via INTRAVENOUS
  Administered 2024-02-26 – 2024-02-27 (×3): 1 mg via INTRAVENOUS
  Filled 2024-02-26 (×4): qty 1

## 2024-02-26 MED ORDER — ALUM & MAG HYDROXIDE-SIMETH 200-200-20 MG/5ML PO SUSP: 30.0000 mL | ORAL | Status: DC | PRN

## 2024-02-26 MED ORDER — CEFAZOLIN SODIUM-DEXTROSE 2-4 GM/100ML-% IV SOLN
2.0000 g | Freq: Four times a day (QID) | INTRAVENOUS | Status: AC
  Administered 2024-02-26 (×2): 2 g via INTRAVENOUS
  Filled 2024-02-26 (×2): qty 100

## 2024-02-26 MED ORDER — CHLORHEXIDINE GLUCONATE 0.12 % MT SOLN
15.0000 mL | Freq: Once | OROMUCOSAL | Status: AC
  Administered 2024-02-26: 15 mL via OROMUCOSAL

## 2024-02-26 MED ORDER — OXYCODONE HCL 5 MG PO TABS
5.0000 mg | ORAL_TABLET | ORAL | Status: DC | PRN
  Administered 2024-02-26: 5 mg via ORAL
  Administered 2024-02-26 – 2024-02-28 (×6): 10 mg via ORAL
  Filled 2024-02-26 (×2): qty 2
  Filled 2024-02-26: qty 1

## 2024-02-26 MED ORDER — PAROXETINE HCL 10 MG PO TABS
10.0000 mg | ORAL_TABLET | Freq: Every day | ORAL | Status: DC
  Administered 2024-02-27 – 2024-02-28 (×2): 10 mg via ORAL
  Filled 2024-02-26 (×2): qty 1

## 2024-02-26 MED ORDER — METHOCARBAMOL 1000 MG/10ML IJ SOLN: 500.0000 mg | Freq: Four times a day (QID) | INTRAMUSCULAR | Status: DC | PRN

## 2024-02-26 MED ORDER — MENTHOL 3 MG MT LOZG: 1.0000 | LOZENGE | OROMUCOSAL | Status: DC | PRN

## 2024-02-26 MED ORDER — METOCLOPRAMIDE HCL 5 MG PO TABS: 5.0000 mg | ORAL_TABLET | Freq: Three times a day (TID) | ORAL | Status: DC | PRN

## 2024-02-26 MED ORDER — METOCLOPRAMIDE HCL 5 MG/ML IJ SOLN: 5.0000 mg | Freq: Three times a day (TID) | INTRAMUSCULAR | Status: DC | PRN

## 2024-02-26 MED ORDER — PHENOL 1.4 % MT LIQD: 1.0000 | OROMUCOSAL | Status: DC | PRN

## 2024-02-26 MED ORDER — LORAZEPAM 0.5 MG PO TABS
0.5000 mg | ORAL_TABLET | Freq: Every day | ORAL | Status: DC | PRN
Start: 2024-02-26 — End: 2024-02-28
  Administered 2024-02-27: 0.5 mg via ORAL
  Filled 2024-02-26 (×2): qty 1

## 2024-02-26 MED ORDER — DROPERIDOL 2.5 MG/ML IJ SOLN: 0.6250 mg | Freq: Once | INTRAMUSCULAR | Status: DC | PRN

## 2024-02-26 MED ORDER — LORATADINE 10 MG PO TABS
10.0000 mg | ORAL_TABLET | Freq: Every day | ORAL | Status: DC
  Administered 2024-02-27 – 2024-02-28 (×2): 10 mg via ORAL
  Filled 2024-02-26 (×2): qty 1

## 2024-02-26 MED ORDER — PROPOFOL 1000 MG/100ML IV EMUL
INTRAVENOUS | Status: AC
  Filled 2024-02-26: qty 100

## 2024-02-26 MED ORDER — FENTANYL CITRATE PF 50 MCG/ML IJ SOSY: 25.0000 ug | PREFILLED_SYRINGE | INTRAMUSCULAR | Status: DC | PRN

## 2024-02-26 MED ORDER — METHOCARBAMOL 500 MG PO TABS
ORAL_TABLET | ORAL | Status: AC
  Filled 2024-02-26: qty 1

## 2024-02-26 MED ORDER — ONDANSETRON HCL 4 MG/2ML IJ SOLN
INTRAMUSCULAR | Status: AC
  Filled 2024-02-26: qty 2

## 2024-02-26 MED ORDER — ACETAMINOPHEN 325 MG PO TABS: 325.0000 mg | ORAL_TABLET | Freq: Four times a day (QID) | ORAL | Status: DC | PRN

## 2024-02-26 MED ORDER — ORAL CARE MOUTH RINSE: 15.0000 mL | Freq: Once | OROMUCOSAL | Status: AC

## 2024-02-26 MED ORDER — PHENYLEPHRINE HCL-NACL 20-0.9 MG/250ML-% IV SOLN
INTRAVENOUS | Status: DC | PRN
  Administered 2024-02-26: 50 ug/min via INTRAVENOUS

## 2024-02-26 MED ORDER — OXYCODONE HCL 5 MG/5ML PO SOLN: 5.0000 mg | Freq: Once | ORAL | Status: AC | PRN

## 2024-02-26 MED ORDER — CEFAZOLIN SODIUM-DEXTROSE 2-4 GM/100ML-% IV SOLN
2.0000 g | INTRAVENOUS | Status: AC
  Administered 2024-02-26: 2 g via INTRAVENOUS
  Filled 2024-02-26: qty 100

## 2024-02-26 MED ORDER — METHOCARBAMOL 500 MG PO TABS
500.0000 mg | ORAL_TABLET | Freq: Four times a day (QID) | ORAL | Status: DC | PRN
  Administered 2024-02-26 – 2024-02-27 (×4): 500 mg via ORAL
  Filled 2024-02-26 (×3): qty 1

## 2024-02-26 MED ORDER — DIPHENHYDRAMINE HCL 12.5 MG/5ML PO ELIX: 12.5000 mg | ORAL_SOLUTION | ORAL | Status: DC | PRN

## 2024-02-26 MED ORDER — OXYCODONE HCL 5 MG PO TABS
ORAL_TABLET | ORAL | Status: AC
  Filled 2024-02-26: qty 1

## 2024-02-26 MED ORDER — PROPOFOL 10 MG/ML IV BOLUS
INTRAVENOUS | Status: DC | PRN
  Administered 2024-02-26 (×2): 10 mg via INTRAVENOUS

## 2024-02-26 MED ORDER — OXYCODONE HCL 5 MG PO TABS
10.0000 mg | ORAL_TABLET | ORAL | Status: DC | PRN
  Filled 2024-02-26 (×4): qty 2

## 2024-02-26 MED ORDER — ONDANSETRON HCL 4 MG/2ML IJ SOLN
INTRAMUSCULAR | Status: DC | PRN
  Administered 2024-02-26: 4 mg via INTRAVENOUS

## 2024-02-26 MED ORDER — FENTANYL CITRATE (PF) 100 MCG/2ML IJ SOLN
INTRAMUSCULAR | Status: AC
  Filled 2024-02-26: qty 2

## 2024-02-26 MED ORDER — POVIDONE-IODINE 10 % EX SWAB
2.0000 | Freq: Once | CUTANEOUS | Status: AC
  Administered 2024-02-26: 2 via TOPICAL

## 2024-02-26 MED ORDER — TRANEXAMIC ACID-NACL 1000-0.7 MG/100ML-% IV SOLN
1000.0000 mg | INTRAVENOUS | Status: AC
  Administered 2024-02-26: 1000 mg via INTRAVENOUS
  Filled 2024-02-26: qty 100

## 2024-02-26 MED ORDER — SODIUM CHLORIDE 0.9 % IR SOLN
Status: DC | PRN
  Administered 2024-02-26: 1000 mL

## 2024-02-26 MED ORDER — LACTATED RINGERS IV SOLN: INTRAVENOUS | Status: DC

## 2024-02-26 MED ORDER — PHENYLEPHRINE 80 MCG/ML (10ML) SYRINGE FOR IV PUSH (FOR BLOOD PRESSURE SUPPORT)
PREFILLED_SYRINGE | INTRAVENOUS | Status: DC | PRN
  Administered 2024-02-26 (×6): 160 ug via INTRAVENOUS

## 2024-02-26 MED ORDER — DOCUSATE SODIUM 100 MG PO CAPS
100.0000 mg | ORAL_CAPSULE | Freq: Two times a day (BID) | ORAL | Status: DC
  Administered 2024-02-26 – 2024-02-28 (×5): 100 mg via ORAL
  Filled 2024-02-26 (×5): qty 1

## 2024-02-26 MED ORDER — ASPIRIN 81 MG PO CHEW
81.0000 mg | CHEWABLE_TABLET | Freq: Two times a day (BID) | ORAL | Status: DC
  Administered 2024-02-26 – 2024-02-28 (×4): 81 mg via ORAL
  Filled 2024-02-26 (×4): qty 1

## 2024-02-26 MED ORDER — BUDESONIDE 0.25 MG/2ML IN SUSP: 0.2500 mg | Freq: Two times a day (BID) | RESPIRATORY_TRACT | Status: DC | PRN

## 2024-02-26 MED ORDER — PROPOFOL 500 MG/50ML IV EMUL
INTRAVENOUS | Status: DC | PRN
  Administered 2024-02-26: 80 ug/kg/min via INTRAVENOUS

## 2024-02-26 MED ORDER — MIDAZOLAM HCL 5 MG/5ML IJ SOLN
INTRAMUSCULAR | Status: DC | PRN
  Administered 2024-02-26 (×2): 1 mg via INTRAVENOUS

## 2024-02-26 MED ORDER — SODIUM CHLORIDE 0.9 % IV SOLN: INTRAVENOUS | Status: DC

## 2024-02-26 SURGICAL SUPPLY — 32 items
BAG COUNTER SPONGE SURGICOUNT (BAG) ×1 IMPLANT
BAG ZIPLOCK 12X15 (MISCELLANEOUS) ×1 IMPLANT
BALL HIP CERAMIC (Hips) IMPLANT
BLADE SAW SGTL 18X1.27X75 (BLADE) ×1 IMPLANT
COVER PERINEAL POST (MISCELLANEOUS) ×1 IMPLANT
COVER SURGICAL LIGHT HANDLE (MISCELLANEOUS) ×1 IMPLANT
CUP SECTOR GRIPTON 50MM (Cup) IMPLANT
DRAPE FOOT SWITCH (DRAPES) ×1 IMPLANT
DRAPE STERI IOBAN 125X83 (DRAPES) ×1 IMPLANT
DRAPE U-SHAPE 47X51 STRL (DRAPES) ×2 IMPLANT
DRSG AQUACEL AG ADV 3.5X10 (GAUZE/BANDAGES/DRESSINGS) ×1 IMPLANT
DRSG XEROFORM 1X8 (GAUZE/BANDAGES/DRESSINGS) IMPLANT
DURAPREP 26ML APPLICATOR (WOUND CARE) ×1 IMPLANT
ELECT PENCIL ROCKER SW 15FT (MISCELLANEOUS) ×1 IMPLANT
ELECT REM PT RETURN 15FT ADLT (MISCELLANEOUS) ×1 IMPLANT
GLOVE BIO SURGEON STRL SZ7.5 (GLOVE) ×1 IMPLANT
GLOVE BIOGEL PI IND STRL 8 (GLOVE) ×2 IMPLANT
GLOVE ECLIPSE 8.0 STRL XLNG CF (GLOVE) ×1 IMPLANT
GOWN STRL REUS W/ TWL XL LVL3 (GOWN DISPOSABLE) ×2 IMPLANT
KIT TURNOVER KIT A (KITS) ×1 IMPLANT
LINER ACET PNNCL PLUS4 NEUTRAL (Hips) IMPLANT
PACK ANTERIOR HIP CUSTOM (KITS) ×1 IMPLANT
SET HNDPC FAN SPRY TIP SCT (DISPOSABLE) ×1 IMPLANT
STAPLER SKIN PROX 35W (STAPLE) IMPLANT
STEM FEM ACTIS HIGH SZ3 (Stem) IMPLANT
SUT ETHIBOND NAB CT1 #1 30IN (SUTURE) ×1 IMPLANT
SUT ETHILON 2 0 PS N (SUTURE) IMPLANT
SUT VIC AB 0 CT1 36 (SUTURE) ×1 IMPLANT
SUT VIC AB 1 CT1 36 (SUTURE) ×1 IMPLANT
SUT VIC AB 2-0 CT1 TAPERPNT 27 (SUTURE) ×2 IMPLANT
TRAY FOLEY MTR SLVR 14FR STAT (SET/KITS/TRAYS/PACK) IMPLANT
YANKAUER SUCT BULB TIP NO VENT (SUCTIONS) ×1 IMPLANT

## 2024-02-26 NOTE — Plan of Care (Signed)

## 2024-02-26 NOTE — Anesthesia Postprocedure Evaluation (Signed)
 Anesthesia Post Note  Patient: Tina Hinton  Procedure(s) Performed: ARTHROPLASTY, HIP, TOTAL, ANTERIOR APPROACH (Left: Hip)     Patient location during evaluation: PACU Anesthesia Type: Spinal Level of consciousness: awake and patient cooperative Pain management: pain level controlled Vital Signs Assessment: post-procedure vital signs reviewed and stable Respiratory status: spontaneous breathing Cardiovascular status: blood pressure returned to baseline Postop Assessment: spinal receding, no apparent nausea or vomiting and adequate PO intake Anesthetic complications: no   No notable events documented.             Lauraine KATHEE [place]

## 2024-02-26 NOTE — TOC Transition Note (Signed)
 Transition of Care [facility]) - Discharge Note   Patient Details  Name: Tina Hinton MRN: [id number] Date of Birth: Sep 15, 1955  [facility] of [facility] CM/SW Contact:  NORMAN ASPEN, LCSW Phone Number: 02/26/2024, 1:52 PM   Clinical Narrative:     Met with pt who confirms she has needed DME in the home.  She is aware that HHPT prearranged with [facility] via ortho MD office prior to surgery.  No further IP CM needs.  Final next level of care: Home w Home Health Services Barriers to Discharge: No Barriers Identified   Patient Goals and CMS Choice Patient states their goals for this hospitalization and ongoing recovery are:: return home          Discharge Placement                       Discharge Plan and Services Additional resources added to the After Visit Summary for                  DME Arranged: N/A DME Agency: NA       HH Arranged: PT HH Agency: [facility]        Social Drivers of Health (SDOH) Interventions SDOH Screenings   Food Insecurity: No Food Insecurity (10/15/2023)  Housing: Low Risk  (10/15/2023)  Transportation Needs: No Transportation Needs (10/15/2023)  Alcohol Screen: Low Risk  (10/15/2023)  Depression (PHQ2-9): Low Risk  (10/15/2023)  Financial Resource Strain: Low Risk  (10/15/2023)  Physical Activity: Unknown (10/15/2023)  Social Connections: Unknown (10/15/2023)  Stress: No Stress Concern Present (10/15/2023)  Tobacco Use: Low Risk  (02/26/2024)     Readmission Risk Interventions     No data to display

## 2024-02-26 NOTE — Transfer of Care (Signed)
 Immediate Anesthesia Transfer of Care Note  Patient: Tina Hinton  Procedure(s) Performed: ARTHROPLASTY, HIP, TOTAL, ANTERIOR APPROACH (Left: Hip)  Patient Location: PACU  Anesthesia Type:Spinal  Level of Consciousness: sedated  Airway & Oxygen Therapy: Patient Spontanous Breathing and Patient connected to face mask oxygen  Post-op Assessment: Report given to RN and Post -op Vital signs reviewed and stable  Post vital signs: Reviewed and stable  Last Vitals:  Vitals Value Taken Time  BP 120/65 02/26/24 12:31  Temp    Pulse 83 02/26/24 12:36  Resp 17 02/26/24 12:36  SpO2 100 % 02/26/24 12:36  Vitals shown include unfiled device data.  Last Pain:  Vitals:   02/26/24 0907  TempSrc: Oral  PainSc: 0-No pain         Complications: No notable events documented.

## 2024-02-26 NOTE — Interval H&P Note (Signed)
 History and Physical Interval Note: The patient understands that she is here today for a left total hip replacement to treat her significant left hip pain and arthritis.  There has been no acute or interval change in her medical status.  The risks and benefits of surgery have been discussed in detail and informed consent has been obtained.  The left operative hip has been marked.  02/26/2024 9:55 AM  Tina Hinton  has presented today for surgery, with the diagnosis of osteoarthritis left hip.  The various methods of treatment have been discussed with the patient and family. After consideration of risks, benefits and other options for treatment, the patient has consented to  Procedure(s): ARTHROPLASTY, HIP, TOTAL, ANTERIOR APPROACH (Left) as a surgical intervention.  The patient's history has been reviewed, patient examined, no change in status, stable for surgery.  I have reviewed the patient's chart and labs.  Questions were answered to the patient's satisfaction.     [name]

## 2024-02-26 NOTE — Op Note (Signed)
 Operative Note  Date of operation: 02/26/2024 Operative diagnosis: Left hip primary osteoarthritis Postoperative diagnosis: Same  Procedure: Left direct anterior total hip arthroplasty  Implants: Implant Name Type Inv. Item Serial No. Manufacturer Lot No. LRB No. Used Action  CUP SECTOR GRIPTON - [id number] Cup CUP SECTOR GRIPTON  [employer]PU[employer]TH[employer] [id number] Left 1 Implanted  LINER ACET PNNCL PLUS4 NEUTRAL - [id number] Hips LINER ACET PNNCL PLUS4 NEUTRAL  DEPUY ORTHOPAEDICS [id number] Left 1 Implanted  STEM FEM ACTIS HIGH SZ3 - [id number] Stem STEM FEM ACTIS HIGH SZ3  DEPUY ORTHOPAEDICS [id number] Left 1 Implanted  BALL HIP CERAMIC - [id number] Hips BALL HIP CERAMIC  DEPUY ORTHOPAEDICS [id number] Left 1 Implanted   Surgeon: Lonni GRADE. Vernetta, MD Assistant: Tory Gaskins, PA-C  Anesthesia: Spinal Antibiotics: IV Ancef  EBL: 100 cc Complications: None  Indications: The patient is an active 68 year old morbidly obese female with debilitating arthritis involving her left hip.  This been getting worse for several years now and at this point her left hip pain is daily and it is detrimentally affecting her mobility, her quality of life and her actives daily living to the point she does wish to proceed with a hip replacement and we agree with this as well.  We did discuss in length and detail the risks of acute blood loss anemia, nerve and vessel injury, infection, DVT, implant failure, dislocation, leg length differences and wound healing issues.  She understands these are all heightened given her obesity.  She also understands that our goals are hopefully decreased pain, improved mobility and improve quality of life.  Procedure description: After informed consent was obtained and the appropriate left hip was marked, the patient was brought to the operating room and set up on her stretcher where spinal anesthesia was obtained.  She was then laid in supine position on stretcher and a Foley  catheter was placed.  Traction boots were next placed on both her feet and then she was placed supine on the Hana fracture table with a perineal post and placed in both legs in inline skeletal traction devices and no traction applied.  Her left operative hip and pelvis were assessed radiographically.  The left hip was then prepped and draped with DuraPrep and sterile drapes.  A timeout was called and she was then applied to the correct patient correct left hip.  An incision was then made just inferior and posterior to the ASIS and carried slightly obliquely down the leg.  Dissection was carried down to the tensor fascia lata muscle and the tensor fascia was then divided longitudinally to proceed with a direct interposed the hip.  Circumflex vessels were identified and cauterized.  The hip capsule identified and opened up in L-type format.  Cobra retractors were placed around the medial and lateral femoral neck and a femoral neck cut was made with an oscillating saw just proximal to the lesser trochanter and this cut was completed with an osteotome.  A corkscrew guide is placed in the femoral head and the femoral head was removed in its entirety and there was a wide area devoid of cartilage.  A bent Hohmann was then placed over the medial acetabular rim and remnants of the acetabular labrum and other debris removed.  Reaming was then initiated from a size 43 reamer and stepwise increments going up to a size 49 reamer with all reamers placed under direct visualization and the last reamer also placed under direct fluoroscopy in order to obtain the depth and reaming, the inclination  and the anteversion.  The real DePuy sector GRIPTION acetabular component size 54 was then placed without difficulty followed by a 32 +4 polythene liner.  Attention was then turned to the femur.  With the left leg externally rotated to 120 degrees, extended and adducted, a Mueller retractor was placed medially and a Hohmann tractor behind  the greater trochanter.  A lateral joint capsule was released and a box cutting osteotome was used into the femoral canal.  Broaching was then initiated using the Actis broaching system from a size 0 going up to a size 3.  With a size 3 in place we trialed a standard offset femoral neck and a 32+1 trial head ball.  This was reduced in the pelvis and based on clinical and radiographic assessment we needed more offset and leg length.  We dislocated the hip and removed the trial components.  We then placed the real Actis femoral component size 3 with high offset and went with a 32+5 ceramic head ball.  Again this was reduced and pelvis and it felt stable on exam and we assessed it clinically and radiographically.  The soft tissue was then irrigated with normal saline solution.  Remnants of the joint capsule were closed with interrupted #1 Ethibond suture followed by #1 Vicryl close attention fashion.  0 Vicryl was used to close the deep tissue and 2-0 Vicryl was used to close the subcutaneous tissue.  The skin was closed with staples.  Aquacel dressing was applied.  The patient was taken off the Hana table and taken to the recovery room.  Tory Gaskins, PA-C did assist during the entire case and beginning to end and his assistance was crucial and medically necessary for soft tissue management and retraction, helping guide implant placement and a layered closure of the wound.

## 2024-02-26 NOTE — Evaluation (Signed)
 Physical Therapy Evaluation Patient Details Name: Tina Hinton MRN: [id number] DOB: 1955/09/13 Today's Date: 02/26/2024  History of Present Illness  68 yo female presents to therapy s/p L THA, anterior approach on 02/26/2024 due to failure of conservative measures. Pt PMH includes but is not limited to: OA, asthma, vertigo, IBS, anxiety, HLD, GERD, LBP, DOE, and SVT.  Clinical Impression    Tina Hinton is a 68 y.o. female POD 0 s/p L THA. Patient reports mod I with mobility at baseline. Patient is now limited by functional impairments (see PT problem list below) and requires min A for bed mobility and CGA for transfers. Patient was able to ambulate 20 feet with RW and CGA level of assist. Patient instructed in exercise to facilitate ROM and circulation to manage edema. Patient will benefit from continued skilled PT interventions to address impairments and progress towards PLOF. Acute PT will follow to progress mobility and stair training in preparation for safe discharge home with family support and [facility] services.       If plan is discharge home, recommend the following: A little help with walking and/or transfers;A little help with bathing/dressing/bathroom;Assistance with cooking/housework;Assist for transportation;Help with stairs or ramp for entrance   Can travel by private vehicle        Equipment Recommendations Rolling walker (2 wheels) (youth)  Recommendations for Other Services       Functional Status Assessment Patient has had a recent decline in their functional status and demonstrates the ability to make significant improvements in function in a reasonable and predictable amount of time.     Precautions / Restrictions Precautions Precautions: Fall Restrictions Weight Bearing Restrictions Per Provider Order: No      Mobility  Bed Mobility Overal bed mobility: Needs Assistance Bed Mobility: Supine to Sit     Supine to sit: Min assist, HOB elevated, Used  rails     General bed mobility comments: increased time and cues    Transfers Overall transfer level: Needs assistance Equipment used: Rolling walker (2 wheels) Transfers: Sit to/from Stand Sit to Stand: Contact guard assist           General transfer comment: min cues    Ambulation/Gait Ambulation/Gait assistance: Contact guard assist Gait Distance (Feet): 20 Feet Assistive device: Rolling walker (2 wheels) Gait Pattern/deviations: Step-to pattern, Decreased stance time - left, Antalgic, Trunk flexed Gait velocity: decreased     General Gait Details: slight trunk flexion with B UE support at RW to offload L LE in stance phase, min cues for safety and RW management  Stairs            Wheelchair Mobility     Tilt Bed    Modified Rankin (Stroke Patients Only)       Balance Overall balance assessment: Needs assistance Sitting-balance support: Feet supported Sitting balance-Leahy Scale: Good     Standing balance support: Bilateral upper extremity supported, During functional activity, Reliant on assistive device for balance Standing balance-Leahy Scale: Poor                               Pertinent Vitals/Pain Pain Assessment Pain Assessment: 0-10 Pain Score: 6  Pain Location: L LE and hip Pain Descriptors / Indicators: Aching, Constant, Discomfort, Dull, Grimacing, Operative site guarding Pain Intervention(s): Limited activity within patient's tolerance, Monitored during session, Premedicated before session, Repositioned, Ice applied    Home Living Family/patient expects to be discharged to:: Private residence Living Arrangements:  Children Available Help at Discharge: Family Type of Home: House Home Access: Stairs to enter Entrance Stairs-Rails: Left Entrance Stairs-Number of Steps: 2 Alternate Level Stairs-Number of Steps: flight Home Layout: Two level;Laundry or work area in [employer]: Rollator (4 wheels);Standard  Walker;Cane - single point;Shower seat      Prior Function Prior Level of Function : Independent/Modified Independent;Driving;Working/employed             Mobility Comments: mod I for all ADLs, self care tasks and IADLs with use of rollator ADLs Comments: works from home     Extremity/Trunk Assessment        Lower Extremity Assessment Lower Extremity Assessment: LLE deficits/detail LLE Deficits / Details: ankle DF/PF 4/5 LLE Sensation: WNL    Cervical / Trunk Assessment Cervical / Trunk Assessment: Normal  Communication   Communication Communication: Impaired Factors Affecting Communication: Hearing impaired    Cognition Arousal: Alert Behavior During Therapy: WFL for tasks assessed/performed   PT - Cognitive impairments: No apparent impairments                         Following commands: Intact       Cueing       General Comments      Exercises Total Joint Exercises Ankle Circles/Pumps: AROM, Both, 10 reps   Assessment/Plan    PT Assessment Patient needs continued PT services  PT Problem List Decreased strength;Decreased range of motion;Decreased activity tolerance;Decreased balance;Decreased mobility;Decreased coordination;Pain       PT Treatment Interventions DME instruction;Gait training;Functional mobility training;Stair training;Therapeutic activities;Therapeutic exercise;Balance training;Neuromuscular re-education;Patient/family education;Modalities    PT Goals (Current goals can be found in the Care Plan section)  Acute Rehab PT Goals Patient Stated Goal: to be able to walk no AD and no pain PT Goal Formulation: With patient Time For Goal Achievement: 03/11/24 Potential to Achieve Goals: Good    Frequency 7X/week     Co-evaluation               AM-PAC PT 6 Clicks Mobility  Outcome Measure Help needed turning from your back to your side while in a flat bed without using bedrails?: A Little Help needed moving from  lying on your back to sitting on the side of a flat bed without using bedrails?: A Little Help needed moving to and from a bed to a chair (including a wheelchair)?: A Little Help needed standing up from a chair using your arms (e.g., wheelchair or bedside chair)?: A Little Help needed to walk in hospital room?: A Little Help needed climbing 3-5 steps with a railing? : Total 6 Click Score: 16    End of Session Equipment Utilized During Treatment: Gait belt Activity Tolerance: Patient tolerated treatment well;No increased pain Patient left: in chair;with call bell/phone within reach;with chair alarm set;with nursing/sitter in room Nurse Communication: Mobility status PT Visit Diagnosis: Unsteadiness on feet (R26.81);Other abnormalities of gait and mobility (R26.89);Muscle weakness (generalized) (M62.81);Difficulty in walking, not elsewhere classified (R26.2);Pain Pain - Right/Left: Left Pain - part of body: Hip;Leg    Time: 8187-8164 PT Time Calculation (min) (ACUTE ONLY): 23 min   Charges:   PT Evaluation $PT Eval Low Complexity: 1 Low PT Treatments $Gait Training: 8-22 mins PT General Charges $$ ACUTE PT VISIT: 1 Visit         [place], PT Acute Rehab   [place] VEAR Drone 02/26/2024, 7:11 PM

## 2024-02-27 DIAGNOSIS — M1612 Unilateral primary osteoarthritis, left hip: Secondary | ICD-10-CM | POA: Diagnosis not present

## 2024-02-27 LAB — CBC
HCT: 37.2 % (ref 36.0–46.0)
Hemoglobin: 11.2 g/dL — ABNORMAL LOW (ref 12.0–15.0)
MCH: 27.7 pg (ref 26.0–34.0)
MCHC: 30.1 g/dL (ref 30.0–36.0)
MCV: 92.1 fL (ref 80.0–100.0)
Platelets: 249 K/uL (ref 150–400)
RBC: 4.04 MIL/uL (ref 3.87–5.11)
RDW: 14.6 % (ref 11.5–15.5)
WBC: 13.2 K/uL — ABNORMAL HIGH (ref 4.0–10.5)
nRBC: 0 % (ref 0.0–0.2)

## 2024-02-27 LAB — BASIC METABOLIC PANEL WITH GFR
Anion gap: 11 (ref 5–15)
BUN: 12 mg/dL (ref 8–23)
CO2: 25 mmol/L (ref 22–32)
Calcium: 9 mg/dL (ref 8.9–10.3)
Chloride: 100 mmol/L (ref 98–111)
Creatinine, Ser: 0.68 mg/dL (ref 0.44–1.00)
GFR, Estimated: >60 mL/min (ref >60)
Glucose, Bld: 132 mg/dL — ABNORMAL HIGH (ref 70–99)
Potassium: 4.9 mmol/L (ref 3.5–5.1)
Sodium: 136 mmol/L (ref 135–145)

## 2024-02-27 MED ORDER — METHOCARBAMOL 500 MG PO TABS: 500.0000 mg | ORAL_TABLET | Freq: Four times a day (QID) | ORAL | 0 refills | Status: AC | PRN

## 2024-02-27 MED ORDER — OXYCODONE HCL 5 MG PO TABS: 5.0000 mg | ORAL_TABLET | Freq: Four times a day (QID) | ORAL | 0 refills | Status: AC | PRN

## 2024-02-27 MED ORDER — SODIUM CHLORIDE 0.9 % IV BOLUS
500.0000 mL | Freq: Once | INTRAVENOUS | Status: AC
  Administered 2024-02-27: 500 mL via INTRAVENOUS

## 2024-02-27 MED ORDER — ASPIRIN 81 MG PO CHEW: 81.0000 mg | CHEWABLE_TABLET | Freq: Two times a day (BID) | ORAL | 0 refills | Status: AC

## 2024-02-27 NOTE — Progress Notes (Signed)
 Physical Therapy Treatment Patient Details Name: Tina Hinton MRN: [id number] DOB: 11/11/1955 Today's Date: 02/27/2024   History of Present Illness 68 yo female presents to therapy s/p L THA, anterior approach on 02/26/2024 due to failure of conservative measures. Pt PMH includes but is not limited to: OA, asthma, vertigo, IBS, anxiety, HLD, GERD, LBP, DOE, and SVT.    PT Comments  Pt very cooperative and progressing steadily with all mobility tasks but with increased time required.  HEP initiated and pt up to ambulate increased distance in hall.  Pt reports plans to order [facility] on [employer] for home use.    If plan is discharge home, recommend the following: A little help with walking and/or transfers;A little help with bathing/dressing/bathroom;Assistance with cooking/housework;Assist for transportation;Help with stairs or ramp for entrance   Can travel by private vehicle        Equipment Recommendations  Rolling walker (2 wheels)    Recommendations for Other Services       Precautions / Restrictions Precautions Precautions: Fall Restrictions Weight Bearing Restrictions Per Provider Order: No     Mobility  Bed Mobility Overal bed mobility: Needs Assistance Bed Mobility: Supine to Sit     Supine to sit: Min assist, HOB elevated, Used rails     General bed mobility comments: increased time and cues    Transfers Overall transfer level: Needs assistance Equipment used: Rolling walker (2 wheels) Transfers: Sit to/from Stand Sit to Stand: Contact guard assist           General transfer comment: min cues    Ambulation/Gait Ambulation/Gait assistance: Contact guard assist Gait Distance (Feet): 52 Feet Assistive device: Rolling walker (2 wheels) Gait Pattern/deviations: Step-to pattern, Decreased stance time - left, Antalgic, Trunk flexed Gait velocity: decreased     General Gait Details: slight trunk flexion with B UE support at RW to offload L LE in stance  phase, min cues for safety and RW management   Stairs             Wheelchair Mobility     Tilt Bed    Modified Rankin (Stroke Patients Only)       Balance Overall balance assessment: Needs assistance Sitting-balance support: Feet supported Sitting balance-Leahy Scale: Good     Standing balance support: Single extremity supported Standing balance-Leahy Scale: Poor                              Communication Communication Communication: Impaired Factors Affecting Communication: Hearing impaired  Cognition Arousal: Alert Behavior During Therapy: WFL for tasks assessed/performed   PT - Cognitive impairments: No apparent impairments                         Following commands: Intact      Cueing Cueing Techniques: Verbal cues  Exercises Total Joint Exercises Ankle Circles/Pumps: Both, 15 reps, AROM Quad Sets: AROM, Both, 10 reps, Supine Heel Slides: AAROM, Left, 20 reps, Supine Hip ABduction/ADduction: AAROM, Left, 15 reps, Supine    General Comments        Pertinent Vitals/Pain Pain Assessment Pain Assessment: 0-10 Pain Score: 7  Pain Location: L LE and hip with activity Pain Descriptors / Indicators: Aching, Constant, Discomfort, Dull, Grimacing, Operative site guarding Pain Intervention(s): Limited activity within patient's tolerance, Monitored during session, Premedicated before session, Ice applied    Home Living  Prior Function            PT Goals (current goals can now be found in the care plan section) Acute Rehab PT Goals Patient Stated Goal: to be able to walk no AD and no pain PT Goal Formulation: With patient Time For Goal Achievement: 03/11/24 Potential to Achieve Goals: Good Progress towards PT goals: Progressing toward goals    Frequency    7X/week      PT Plan      Co-evaluation              AM-PAC PT 6 Clicks Mobility   Outcome Measure  Help needed  turning from your back to your side while in a flat bed without using bedrails?: A Little Help needed moving from lying on your back to sitting on the side of a flat bed without using bedrails?: A Little Help needed moving to and from a bed to a chair (including a wheelchair)?: A Little Help needed standing up from a chair using your arms (e.g., wheelchair or bedside chair)?: A Little Help needed to walk in hospital room?: A Little Help needed climbing 3-5 steps with a railing? : Total 6 Click Score: 16    End of Session Equipment Utilized During Treatment: Gait belt Activity Tolerance: Patient tolerated treatment well;No increased pain Patient left: in chair;with call bell/phone within reach;with chair alarm set Nurse Communication: Mobility status PT Visit Diagnosis: Unsteadiness on feet (R26.81);Other abnormalities of gait and mobility (R26.89);Muscle weakness (generalized) (M62.81);Difficulty in walking, not elsewhere classified (R26.2);Pain Pain - Right/Left: Left Pain - part of body: Hip;Leg     Time: 9091-9055 PT Time Calculation (min) (ACUTE ONLY): 36 min  Charges:    $Gait Training: 8-22 mins $Therapeutic Exercise: 8-22 mins PT General Charges $$ ACUTE PT VISIT: 1 Visit                     [facility] PT Acute Rehabilitation Services Office [phone]    Phillipa Morden 02/27/2024, 10:40 AM

## 2024-02-27 NOTE — Plan of Care (Signed)
  Problem: Education: Goal: Knowledge of General Education information will improve Description: Including pain rating scale, medication(s)/side effects and non-pharmacologic comfort measures Outcome: Progressing   Problem: Health Behavior/Discharge Planning: Goal: Ability to manage health-related needs will improve Outcome: Progressing   Problem: Clinical Measurements: Goal: Ability to maintain clinical measurements within normal limits will improve Outcome: Progressing Goal: Will remain free from infection Outcome: Progressing Goal: Diagnostic test results will improve Outcome: Progressing Goal: Respiratory complications will improve Outcome: Progressing Goal: Cardiovascular complication will be avoided Outcome: Progressing   Problem: Activity: Goal: Risk for activity intolerance will decrease Outcome: Progressing   Problem: Nutrition: Goal: Adequate nutrition will be maintained Outcome: Adequate for Discharge   Problem: Coping: Goal: Level of anxiety will decrease Outcome: Progressing   Problem: Pain Managment: Goal: General experience of comfort will improve and/or be controlled Outcome: Progressing   Problem: Safety: Goal: Ability to remain free from injury will improve Outcome: Progressing   Problem: Skin Integrity: Goal: Risk for impaired skin integrity will decrease Outcome: Progressing   Problem: Education: Goal: Knowledge of the prescribed therapeutic regimen will improve Outcome: Progressing Goal: Understanding of discharge needs will improve Outcome: Progressing Goal: Individualized Educational Video(s) Outcome: Completed/Met   Problem: Activity: Goal: Ability to avoid complications of mobility impairment will improve Outcome: Progressing Goal: Ability to tolerate increased activity will improve Outcome: Adequate for Discharge   Problem: Clinical Measurements: Goal: Postoperative complications will be avoided or minimized Outcome: Progressing    Problem: Pain Management: Goal: Pain level will decrease with appropriate interventions Outcome: Progressing   Problem: Skin Integrity: Goal: Will show signs of wound healing Outcome: Progressing

## 2024-02-27 NOTE — Progress Notes (Signed)
 Physical Therapy Treatment Patient Details Name: Tina Hinton MRN: [id number] DOB: [birth year] Today's Date: 02/27/2024   History of Present Illness 68 yo female presents to therapy s/p L THA, anterior approach on 02/26/2024 due to failure of conservative measures. Pt PMH includes but is not limited to: OA, asthma, vertigo, IBS, anxiety, HLD, GERD, LBP, DOE, and SVT.    PT Comments  Pt continues motivated and progressing steadily with mobility.  Pt requiring increased time but up to ambulate increased distance in hall and up to bathroom for toileting with hand hygiene standing at sink.  Pt hopeful for dc home tomorrow.    If plan is discharge home, recommend the following: A little help with walking and/or transfers;A little help with bathing/dressing/bathroom;Assistance with cooking/housework;Assist for transportation;Help with stairs or ramp for entrance   Can travel by private vehicle        Equipment Recommendations  Rolling walker (2 wheels)    Recommendations for Other Services       Precautions / Restrictions Precautions Precautions: Fall Restrictions Weight Bearing Restrictions Per Provider Order: Yes LLE Weight Bearing Per Provider Order: Weight bearing as tolerated     Mobility  Bed Mobility Overal bed mobility: Needs Assistance Bed Mobility: Sit to Supine     Supine to sit: Min assist, HOB elevated, Used rails Sit to supine: Min assist   General bed mobility comments: increased time and cues    Transfers Overall transfer level: Needs assistance Equipment used: Rolling walker (2 wheels) Transfers: Sit to/from Stand Sit to Stand: Contact guard assist, Supervision           General transfer comment: min cues    Ambulation/Gait Ambulation/Gait assistance: Contact guard assist, Supervision Gait Distance (Feet): 64 Feet (and additional 15' back from bathroom) Assistive device: Rolling walker (2 wheels) Gait Pattern/deviations: Step-to pattern,  Decreased stance time - left, Antalgic, Trunk flexed Gait velocity: decreased     General Gait Details: INcreased time with cues for sequence, posture and position from RW; noted intermittent difficulty advancing L LE   Stairs             Wheelchair Mobility     Tilt Bed    Modified Rankin (Stroke Patients Only)       Balance Overall balance assessment: Needs assistance Sitting-balance support: Feet supported Sitting balance-Leahy Scale: Good     Standing balance support: Single extremity supported Standing balance-Leahy Scale: Poor                              Communication Communication Communication: Impaired Factors Affecting Communication: Hearing impaired  Cognition Arousal: Alert Behavior During Therapy: WFL for tasks assessed/performed   PT - Cognitive impairments: No apparent impairments                         Following commands: Intact      Cueing Cueing Techniques: Verbal cues  Exercises Total Joint Exercises Ankle Circles/Pumps: Both, 15 reps, AROM Quad Sets: AROM, Both, 10 reps, Supine Heel Slides: AAROM, Left, 20 reps, Supine Hip ABduction/ADduction: AAROM, Left, 15 reps, Supine    General Comments        Pertinent Vitals/Pain Pain Assessment Pain Assessment: 0-10 Pain Score: 7  Pain Location: L LE and hip with activity Pain Descriptors / Indicators: Aching, Constant, Discomfort, Dull, Grimacing, Operative site guarding Pain Intervention(s): Limited activity within patient's tolerance, Monitored during session, Premedicated before session, Ice applied  Home Living                          Prior Function            PT Goals (current goals can now be found in the care plan section) Acute Rehab PT Goals Patient Stated Goal: to be able to walk no AD and no pain PT Goal Formulation: With patient Time For Goal Achievement: 03/11/24 Potential to Achieve Goals: Good Progress towards PT goals:  Progressing toward goals    Frequency    7X/week      PT Plan      Co-evaluation              AM-PAC PT 6 Clicks Mobility   Outcome Measure  Help needed turning from your back to your side while in a flat bed without using bedrails?: A Little Help needed moving from lying on your back to sitting on the side of a flat bed without using bedrails?: A Little Help needed moving to and from a bed to a chair (including a wheelchair)?: A Little Help needed standing up from a chair using your arms (e.g., wheelchair or bedside chair)?: A Little Help needed to walk in hospital room?: A Little Help needed climbing 3-5 steps with a railing? : A Lot 6 Click Score: 17    End of Session Equipment Utilized During Treatment: Gait belt Activity Tolerance: Patient tolerated treatment well;No increased pain Patient left: in bed;with call bell/phone within reach;with bed alarm set Nurse Communication: Mobility status PT Visit Diagnosis: Unsteadiness on feet (R26.81);Other abnormalities of gait and mobility (R26.89);Muscle weakness (generalized) (M62.81);Difficulty in walking, not elsewhere classified (R26.2);Pain Pain - Right/Left: Left Pain - part of body: Hip;Leg     Time: 1340-1409 PT Time Calculation (min) (ACUTE ONLY): 29 min  Charges:    $Gait Training: 8-22 mins $Therapeutic Activity: 8-22 mins PT General Charges $$ ACUTE PT VISIT: 1 Visit                     [facility] PT Acute Rehabilitation Services Office [phone]    Cloee Dunwoody 02/27/2024, 3:59 PM

## 2024-02-27 NOTE — Discharge Instructions (Signed)
 Per [facility] clinic policy, our goal is ensure optimal postoperative pain control with a multimodal pain management strategy. For all [facility] patients, our goal is to wean post-operative narcotic medications by 6 weeks post-operatively. If this is not possible due to utilization of pain medication prior to surgery, your [facility] doctor will support your acute post-operative pain control for the first 6 weeks postoperatively, with a plan to transition you back to your primary pain team following that. Tina Hinton will work to ensure a Therapist, occupational.  INSTRUCTIONS AFTER JOINT REPLACEMENT   Remove items at home which could result in a fall. This includes throw rugs or furniture in walking pathways ICE to the affected joint every three hours while awake for 30 minutes at a time, for at least the first 3-5 days, and then as needed for pain and swelling.  Continue to use ice for pain and swelling. You may notice swelling that will progress down to the foot and ankle.  This is normal after surgery.  Elevate your leg when you are not up walking on it.   Continue to use the breathing machine you got in the hospital (incentive spirometer) which will help keep your temperature down.  It is common for your temperature to cycle up and down following surgery, especially at night when you are not up moving around and exerting yourself.  The breathing machine keeps your lungs expanded and your temperature down.   DIET:  As you were doing prior to hospitalization, we recommend a well-balanced diet.  DRESSING / WOUND CARE / SHOWERING  Keep the surgical dressing until follow up.  The dressing is water  proof, so you can shower without any extra covering.  IF THE DRESSING FALLS OFF or the wound gets wet inside, change the dressing with sterile gauze.  Please use good hand washing techniques before changing the dressing.  Do not use any lotions or creams on the incision until instructed by your surgeon.     ACTIVITY  Increase activity slowly as tolerated, but follow the weight bearing instructions below.   No driving for 6 weeks or until further direction given by your physician.  You cannot drive while taking narcotics.  No lifting or carrying greater than 10 lbs. until further directed by your surgeon. Avoid periods of inactivity such as sitting longer than an hour when not asleep. This helps prevent blood clots.  You may return to work once you are authorized by your doctor.     WEIGHT BEARING   Weight bearing as tolerated with assist device (walker, cane, etc) as directed, use it as long as suggested by your surgeon or therapist, typically at least 4-6 weeks.   EXERCISES  Results after joint replacement surgery are often greatly improved when you follow the exercise, range of motion and muscle strengthening exercises prescribed by your doctor. Safety measures are also important to protect the joint from further injury. Any time any of these exercises cause you to have increased pain or swelling, decrease what you are doing until you are comfortable again and then slowly increase them. If you have problems or questions, call your caregiver or physical therapist for advice.   Rehabilitation is important following a joint replacement. After just a few days of immobilization, the muscles of the leg can become weakened and shrink (atrophy).  These exercises are designed to build up the tone and strength of the thigh and leg muscles and to improve motion. Often times heat used for twenty to thirty minutes before  working out will loosen up your tissues and help with improving the range of motion but do not use heat for the first two weeks following surgery (sometimes heat can increase post-operative swelling).   These exercises can be done on a training (exercise) mat, on the floor, on a table or on a bed. Use whatever works the best and is most comfortable for you.    Use music or television  while you are exercising so that the exercises are a pleasant break in your day. This will make your life better with the exercises acting as a break in your routine that you can look forward to.   Perform all exercises about fifteen times, three times per day or as directed.  You should exercise both the operative leg and the other leg as well.  Exercises include:   Quad Sets - Tighten up the muscle on the front of the thigh (Quad) and hold for 5-10 seconds.   Straight Leg Raises - With your knee straight (if you were given a brace, keep it on), lift the leg to 60 degrees, hold for 3 seconds, and slowly lower the leg.  Perform this exercise against resistance later as your leg gets stronger.  Leg Slides: Lying on your back, slowly slide your foot toward your buttocks, bending your knee up off the floor (only go as far as is comfortable). Then slowly slide your foot back down until your leg is flat on the floor again.  Angel Wings: Lying on your back spread your legs to the side as far apart as you can without causing discomfort.  Hamstring Strength:  Lying on your back, push your heel against the floor with your leg straight by tightening up the muscles of your buttocks.  Repeat, but this time bend your knee to a comfortable angle, and push your heel against the floor.  You may put a pillow under the heel to make it more comfortable if necessary.   A rehabilitation program following joint replacement surgery can speed recovery and prevent re-injury in the future due to weakened muscles. Contact your doctor or a physical therapist for more information on knee rehabilitation.    CONSTIPATION  Constipation is defined medically as fewer than three stools per week and severe constipation as less than one stool per week.  Even if you have a regular bowel pattern at home, your normal regimen is likely to be disrupted due to multiple reasons following surgery.  Combination of anesthesia, postoperative  narcotics, change in appetite and fluid intake all can affect your bowels.   YOU MUST use at least one of the following options; they are listed in order of increasing strength to get the job done.  They are all available over the counter, and you may need to use some, POSSIBLY even all of these options:    Drink plenty of fluids (prune juice may be helpful) and high fiber foods Colace 100 mg by mouth twice a day  Senokot for constipation as directed and as needed Dulcolax (bisacodyl), take with full glass of water  Miralax (polyethylene glycol) once or twice a day as needed.  If you have tried all these things and are unable to have a bowel movement in the first 3-4 days after surgery call either your surgeon or your primary doctor.    If you experience loose stools or diarrhea, hold the medications until you stool forms back up.  If your symptoms do not get better within 1 week  or if they get worse, check with your doctor.  If you experience the worst abdominal pain ever or develop nausea or vomiting, please contact the office immediately for further recommendations for treatment.   ITCHING:  If you experience itching with your medications, try taking only a single pain pill, or even half a pain pill at a time.  You can also use Benadryl  over the counter for itching or also to help with sleep.   TED HOSE STOCKINGS:  Use stockings on both legs until for at least 2 weeks or as directed by physician office. They may be removed at night for sleeping.  MEDICATIONS:  See your medication summary on the "After Visit Summary" that nursing will review with you.  You may have some home medications which will be placed on hold until you complete the course of blood thinner medication.  It is important for you to complete the blood thinner medication as prescribed.  PRECAUTIONS:  If you experience chest pain or shortness of breath - call 911 immediately for transfer to the hospital emergency department.    If you develop a fever greater that 101 F, purulent drainage from wound, increased redness or drainage from wound, foul odor from the wound/dressing, or calf pain - CONTACT YOUR SURGEON.                                                   FOLLOW-UP APPOINTMENTS:  If you do not already have a post-op appointment, please call the office for an appointment to be seen by your surgeon.  Guidelines for how soon to be seen are listed in your "After Visit Summary", but are typically between 1-4 weeks after surgery.  OTHER INSTRUCTIONS:   Knee Replacement:  Do not place pillow under knee, focus on keeping the knee straight while resting. CPM instructions: 0-90 degrees, 2 hours in the morning, 2 hours in the afternoon, and 2 hours in the evening. Place foam block, curve side up under heel at all times except when in CPM or when walking.  DO NOT modify, tear, cut, or change the foam block in any way.  POST-OPERATIVE OPIOID TAPER INSTRUCTIONS: It is important to wean off of your opioid medication as soon as possible. If you do not need pain medication after your surgery it is ok to stop day one. Opioids include: Codeine, Hydrocodone(Norco, Vicodin), Oxycodone (Percocet, oxycontin ) and hydromorphone  amongst others.  Long term and even short term use of opiods can cause: Increased pain response Dependence Constipation Depression Respiratory depression And more.  Withdrawal symptoms can include Flu like symptoms Nausea, vomiting And more Techniques to manage these symptoms Hydrate well Eat regular healthy meals Stay active Use relaxation techniques(deep breathing, meditating, yoga) Do Not substitute Alcohol to help with tapering If you have been on opioids for less than two weeks and do not have pain than it is ok to stop all together.  Plan to wean off of opioids This plan should start within one week post op of your joint replacement. Maintain the same interval or time between taking each dose  and first decrease the dose.  Cut the total daily intake of opioids by one tablet each day Next start to increase the time between doses. The last dose that should be eliminated is the evening dose.   MAKE SURE YOU:  Understand these instructions.  Get help right away if you are not doing well or get worse.    Thank you for letting us  be a part of your medical care team.  It is a privilege we respect greatly.  We hope these instructions will help you stay on track for a fast and full recovery!     Dental Antibiotics:  In most cases prophylactic antibiotics for Dental procdeures after total joint surgery are not necessary.  Exceptions are as follows:  1. History of prior total joint infection  2. Severely immunocompromised (Organ Transplant, cancer chemotherapy, Rheumatoid biologic meds such as Humera)  3. Poorly controlled diabetes (A1C &gt; 8.0, blood glucose over 200)  If you have one of these conditions, contact your surgeon for an antibiotic prescription, prior to your dental procedure.

## 2024-02-27 NOTE — Progress Notes (Signed)
 Subjective: 1 Day Post-Op Procedure(s) (LRB): ARTHROPLASTY, HIP, TOTAL, ANTERIOR APPROACH (Left) Patient reports pain as moderate.  Said she had a rough evening. Denies CP or SOB but slight wheezing.  Afebrile and sats normal.  Slightly tachy.  Hgb stable.  Objective: Vital signs in last 24 hours: Temp:  [97.4 F (36.3 C)-98.4 F (36.9 C)] 98 F (36.7 C) (09/20 0621) Pulse Rate:  [66-110] 110 (09/20 0621) Resp:  [13-18] 17 (09/20 0621) BP: (109-148)/(65-96) 109/67 (09/20 0621) SpO2:  [96 %-100 %] 96 % (09/20 0621) Weight:  [94.8 kg] 94.8 kg (09/19 1349)  Intake/Output from previous day: 09/19 0701 - 09/20 0700 In: 2900 [P.O.:300; I.V.:2200; IV Piggyback:400] Out: 1450 [Urine:1350; Blood:100] Intake/Output this shift: No intake/output data recorded.  Recent Labs    02/27/24 0318  HGB 11.2*   Recent Labs    02/27/24 0318  WBC 13.2*  RBC 4.04  HCT 37.2  PLT 249   Recent Labs    02/27/24 0318  NA 136  K 4.9  CL 100  CO2 25  BUN 12  CREATININE 0.68  GLUCOSE 132*  CALCIUM  9.0   No results for input(s): LABPT, INR in the last 72 hours.  Sensation intact distally Intact pulses distally Dorsiflexion/Plantar flexion intact Incision: dressing C/D/I   Assessment/Plan: 1 Day Post-Op Procedure(s) (LRB): ARTHROPLASTY, HIP, TOTAL, ANTERIOR APPROACH (Left) Up with therapy Plan for discharge tomorrow Discharge home with home health      [name] 02/27/2024, 10:07 AM

## 2024-02-28 DIAGNOSIS — M1612 Unilateral primary osteoarthritis, left hip: Secondary | ICD-10-CM | POA: Diagnosis not present

## 2024-02-28 NOTE — Progress Notes (Signed)
  Subjective: Patient stable.  Pain controlled.   Objective: Vital signs in last 24 hours: Temp:  [98 F (36.7 C)-98.7 F (37.1 C)] 98.7 F (37.1 C) (09/21 0537) Pulse Rate:  [97-106] 101 (09/21 0537) Resp:  [17-18] 17 (09/21 0537) BP: (114-123)/(62-70) 123/62 (09/21 0537) SpO2:  [93 %-96 %] 96 % (09/21 0537)  Intake/Output from previous day: 09/20 0701 - 09/21 0700 In: 700 [P.O.:600; I.V.:100] Out: 200 [Urine:200] Intake/Output this shift: Total I/O In: 240 [P.O.:240] Out: -   Exam:  Sensation intact distally Dorsiflexion/Plantar flexion intact Compartment soft  Labs: Recent Labs    02/27/24 0318  HGB 11.2*   Recent Labs    02/27/24 0318  WBC 13.2*  RBC 4.04  HCT 37.2  PLT 249   Recent Labs    02/27/24 0318  NA 136  K 4.9  CL 100  CO2 25  BUN 12  CREATININE 0.68  GLUCOSE 132*  CALCIUM  9.0   No results for input(s): LABPT, INR in the last 72 hours.  Assessment/Plan: Patient did well with therapy today.  Okay for discharge to home.  She will follow-up with Dr. Vernetta approximately 2 weeks.   G Scott Rasheed Welty 02/28/2024, 10:26 AM

## 2024-02-28 NOTE — Plan of Care (Signed)
  Problem: Education: Goal: Knowledge of General Education information will improve Description: Including pain rating scale, medication(s)/side effects and non-pharmacologic comfort measures Outcome: Adequate for Discharge   Problem: Health Behavior/Discharge Planning: Goal: Ability to manage health-related needs will improve Outcome: Progressing   Problem: Clinical Measurements: Goal: Ability to maintain clinical measurements within normal limits will improve Outcome: Progressing Goal: Will remain free from infection Outcome: Progressing Goal: Diagnostic test results will improve Outcome: Progressing Goal: Respiratory complications will improve Outcome: Progressing Goal: Cardiovascular complication will be avoided Outcome: Progressing   Problem: Activity: Goal: Risk for activity intolerance will decrease Outcome: Adequate for Discharge   Problem: Nutrition: Goal: Adequate nutrition will be maintained Outcome: Completed/Met   Problem: Coping: Goal: Level of anxiety will decrease Outcome: Progressing   Problem: Elimination: Goal: Will not experience complications related to bowel motility Outcome: Progressing Goal: Will not experience complications related to urinary retention Outcome: Completed/Met   Problem: Pain Managment: Goal: General experience of comfort will improve and/or be controlled Outcome: Progressing   Problem: Safety: Goal: Ability to remain free from injury will improve Outcome: Progressing   Problem: Skin Integrity: Goal: Risk for impaired skin integrity will decrease Outcome: Progressing   Problem: Education: Goal: Knowledge of the prescribed therapeutic regimen will improve Outcome: Progressing Goal: Understanding of discharge needs will improve Outcome: Progressing   Problem: Activity: Goal: Ability to avoid complications of mobility impairment will improve Outcome: Progressing Goal: Ability to tolerate increased activity will  improve Outcome: Adequate for Discharge   Problem: Clinical Measurements: Goal: Postoperative complications will be avoided or minimized Outcome: Progressing   Problem: Pain Management: Goal: Pain level will decrease with appropriate interventions Outcome: Progressing   Problem: Skin Integrity: Goal: Will show signs of wound healing Outcome: Progressing

## 2024-02-28 NOTE — Progress Notes (Signed)
 Physical Therapy Treatment Patient Details Name: Tina Hinton MRN: [id number] DOB: 1955/06/13 Today's Date: 02/28/2024   History of Present Illness 68 yo female presents to therapy s/p L THA, anterior approach on 02/26/2024 due to failure of conservative measures. Pt PMH includes but is not limited to: OA, asthma, vertigo, IBS, anxiety, HLD, GERD, LBP, DOE, and SVT.    PT Comments  Pt motivated and progressing well with mobility including up to ambulate increased distance in hall, negotiated stairs, reviewed car transfers and with multiple questions asked and answered.  Pt eager for dc home this date.    If plan is discharge home, recommend the following: A little help with walking and/or transfers;A little help with bathing/dressing/bathroom;Assistance with cooking/housework;Assist for transportation;Help with stairs or ramp for entrance   Can travel by private vehicle        Equipment Recommendations  Rolling walker (2 wheels)    Recommendations for Other Services       Precautions / Restrictions Precautions Precautions: Fall Restrictions Weight Bearing Restrictions Per Provider Order: Yes LLE Weight Bearing Per Provider Order: Weight bearing as tolerated     Mobility  Bed Mobility Overal bed mobility: Needs Assistance Bed Mobility: Supine to Sit     Supine to sit: Contact guard     General bed mobility comments: CGA for safety; VC for use of gait belt to self assist L LE    Transfers Overall transfer level: Needs assistance Equipment used: Rolling walker (2 wheels) Transfers: Sit to/from Stand Sit to Stand: Supervision           General transfer comment: min cues    Ambulation/Gait Ambulation/Gait assistance: Contact guard assist, Supervision Gait Distance (Feet): 100 Feet Assistive device: Rolling walker (2 wheels) Gait Pattern/deviations: Step-to pattern, Decreased stance time - left, Antalgic, Trunk flexed Gait velocity: decreased     General  Gait Details: INcreased time with cues for sequence, posture and position from RW   Stairs Stairs: Yes Stairs assistance: Min assist Stair Management: One rail Left, Step to pattern, Forwards, Backwards, With walker, With cane Number of Stairs: 4 General stair comments: single step bkwd for stool into high bed; 3 steps with rail and cane; cues for sequence   Wheelchair Mobility     Tilt Bed    Modified Rankin (Stroke Patients Only)       Balance Overall balance assessment: Needs assistance Sitting-balance support: Feet supported Sitting balance-Leahy Scale: Good     Standing balance support: No upper extremity supported Standing balance-Leahy Scale: Fair                              Hotel manager: Impaired Factors Affecting Communication: Hearing impaired  Cognition Arousal: Alert Behavior During Therapy: WFL for tasks assessed/performed   PT - Cognitive impairments: No apparent impairments                         Following commands: Intact      Cueing Cueing Techniques: Verbal cues  Exercises      General Comments        Pertinent Vitals/Pain Pain Assessment Pain Assessment: 0-10 Pain Score: 5  Pain Location: L LE and hip with activity Pain Descriptors / Indicators: Aching, Sore Pain Intervention(s): Limited activity within patient's tolerance, Monitored during session, Premedicated before session, Ice applied    Home Living  Prior Function            PT Goals (current goals can now be found in the care plan section) Acute Rehab PT Goals Patient Stated Goal: to be able to walk no AD and no pain PT Goal Formulation: With patient Time For Goal Achievement: 03/11/24 Potential to Achieve Goals: Good Progress towards PT goals: Progressing toward goals    Frequency    7X/week      PT Plan      Co-evaluation              AM-PAC PT 6 Clicks  Mobility   Outcome Measure  Help needed turning from your back to your side while in a flat bed without using bedrails?: A Little Help needed moving from lying on your back to sitting on the side of a flat bed without using bedrails?: A Little Help needed moving to and from a bed to a chair (including a wheelchair)?: A Little Help needed standing up from a chair using your arms (e.g., wheelchair or bedside chair)?: A Little Help needed to walk in hospital room?: A Little Help needed climbing 3-5 steps with a railing? : A Little 6 Click Score: 18    End of Session Equipment Utilized During Treatment: Gait belt Activity Tolerance: Patient tolerated treatment well;No increased pain Patient left: in chair;with call bell/phone within reach;with chair alarm set Nurse Communication: Mobility status PT Visit Diagnosis: Unsteadiness on feet (R26.81);Other abnormalities of gait and mobility (R26.89);Muscle weakness (generalized) (M62.81);Difficulty in walking, not elsewhere classified (R26.2);Pain Pain - Right/Left: Left Pain - part of body: Hip;Leg     Time: 9090-9048 PT Time Calculation (min) (ACUTE ONLY): 42 min  Charges:    $Gait Training: 8-22 mins $Therapeutic Activity: 8-22 mins PT General Charges $$ ACUTE PT VISIT: 1 Visit                     [facility] PT Acute Rehabilitation Services Office [phone]    Chelsa Stout 02/28/2024, 12:15 PM

## 2024-02-29 ENCOUNTER — Encounter (HOSPITAL_COMMUNITY): Payer: Self-pay | Admitting: Orthopaedic Surgery

## 2024-02-29 ENCOUNTER — Other Ambulatory Visit: Payer: Self-pay | Admitting: Orthopaedic Surgery

## 2024-03-04 ENCOUNTER — Other Ambulatory Visit: Payer: Self-pay | Admitting: Family Medicine

## 2024-03-04 ENCOUNTER — Encounter: Payer: Self-pay | Admitting: Orthopaedic Surgery

## 2024-03-04 DIAGNOSIS — E7849 Other hyperlipidemia: Secondary | ICD-10-CM

## 2024-03-10 ENCOUNTER — Encounter: Payer: Self-pay | Admitting: Orthopaedic Surgery

## 2024-03-10 ENCOUNTER — Other Ambulatory Visit: Payer: Self-pay

## 2024-03-10 ENCOUNTER — Ambulatory Visit: Admitting: Orthopaedic Surgery

## 2024-03-10 DIAGNOSIS — Z96642 Presence of left artificial hip joint: Secondary | ICD-10-CM

## 2024-03-10 NOTE — Progress Notes (Signed)
 The patient is a 68 year old female who is here today for first postoperative visit 2 weeks status post a left total hip replacement to treat significant left hip pain and arthritis.  She is ambulating with a rolling walker.  She is still participating in home health PT but would like to have some sessions of outpatient physical therapy and I think this is reasonable.  We can see about setting that up at [facility] to work on her balance and coordination as well as her mobility and gait training.  They can work on some subtle strengthening as well.  Her left hip incision looks good.  Staples removed and Steri-Strips applied.  She is been compliant with a baby aspirin  twice daily and her calf is soft.  She can now stop her baby aspirin  twice daily.  She is only taking some Flexeril  at bedtime.  As of now she does not need any refills but will let us  know if she does on any medications.  She can drive from my standpoint she is she is not taking anything during the day.  Will see her back in 4 weeks to see how she is doing overall.  I did give her a work note to keep her out of work until Monday, October 20 to allow further recovery from her hip replacement.

## 2024-03-15 NOTE — Therapy (Signed)
 OUTPATIENT PHYSICAL THERAPY LOWER EXTREMITY EVALUATION   Patient Name: Tina Hinton MRN: [id number] DOB:[birth year], 68 y.o., female Today's Date: 03/16/2024   END OF SESSION:  PT End of Session - 03/16/24 0848     Visit Number 1    Date for Recertification  05/11/24    Authorization Type Federal [employer]    PT Start Time 0848    PT Stop Time [phone]    PT Time Calculation (min) 50 min    Activity Tolerance Patient tolerated treatment well    Behavior During Therapy [facility] for tasks assessed/performed          Past Medical History:  Diagnosis Date   Ankle fracture    Right   Anxiety    Arthritis    hands, knees   Asthma    Bilateral knee pain    Cancer (HCC)    skin cancer   Chest pain    Chicken pox    Clavicle fracture    Left   Complication of anesthesia    Depression    Dysrhythmia    Foot fracture, left    GERD (gastroesophageal reflux disease)    History of hiatal hernia    Hyperlipidemia    IBS (irritable bowel syndrome) 01/19/2017   Joint pain    Low back pain    Osteoarthritis    Other fatigue    Palpitations    Panic attacks    Seasonal allergies    Shortness of breath on exertion    SVT (supraventricular tachycardia)    Swallowing difficulty    Uterine polyp    ablation   Past Surgical History:  Procedure Laterality Date   BIOSPY     HEAD   CESAREAN SECTION     COLONOSCOPY     ENDOMETRIAL ABLATION     MANDIBLE SURGERY     TONSILLECTOMY     TOTAL HIP ARTHROPLASTY Left 02/26/2024   Procedure: ARTHROPLASTY, HIP, TOTAL, ANTERIOR APPROACH;  Surgeon: Vernetta Lonni GRADE, MD;  Location: [facility];  Service: Orthopedics;  Laterality: Left;   TUBAL LIGATION     UPPER GASTROINTESTINAL ENDOSCOPY     Patient Active Problem List   Diagnosis Date Noted   Status post total replacement of left hip 02/26/2024   Claustrophobia 10/15/2023   Vitamin D  deficiency 06/25/2021   Class 3 severe obesity with serious comorbidity and body mass index (BMI) of  45.0 to 49.9 in adult (HCC) 03/12/2021   Prediabetes 03/12/2021   Lumbar strain, subsequent encounter 02/14/2021   Asthma    Vertigo 01/31/2020   Aortic atherosclerosis 02/15/2018   IBS (irritable bowel syndrome) - D 01/19/2017   Primary osteoarthritis of both knees 01/19/2017   Preventative health care 07/30/2015   Obesity, unspecified 07/30/2015   Anxiety 06/25/2015   Hyperlipidemia LDL goal <100 06/25/2015   Sprain of medial collateral ligament of left knee 06/25/2015   Osteoarthritis of both knees 06/25/2015   Degeneration of intervertebral disc of lumbar region 01/04/2015   Spondylolisthesis at L5-S1 level 01/04/2015   Fracture of lateral malleolus 11/14/2014   Foot, fracture, navicular 11/14/2014    PCP: Antonio Cyndee Jamee JONELLE, DO   REFERRING PROVIDER: Vernetta Lonni GRADE, MD  REFERRING DIAG: [phone] (ICD-10-CM) - Status post total replacement of left hip   THERAPY DIAG:  Muscle weakness (generalized)  Pain in left hip  Unsteadiness on feet  Other abnormalities of gait and mobility  RATIONALE FOR EVALUATION AND TREATMENT: Rehabilitation  ONSET DATE: 02/26/2024 - L THA (anterior approach)  NEXT MD VISIT: 04/07/2024   SUBJECTIVE:                                                                                                                                                                                                         SUBJECTIVE STATEMENT: Patient is ~2.5 weeks status post L THA via anterior approach on 02/26/2024.  She initially had 2 weeks of HH PT which was completed on 03/11/24.  She continues to experience what she feels is bone pain/discomfort in the L hip but nothing like than pain she had before surgery.  Also notes numbness along the surgical incision.  Still using the 4WW full time due to fear of falling - she states she has been using the 4WW or cane for the past 9 months due to limited standing/walking tolerance and fear of falling.  She would like  to eventually try to wean from all ADs.  Sleeping has been difficult as she normally sleeps on her side and cannot comfortably right now - sleeping on her back makes her back hurt.  She thinks she may have a leg length discrepancy following the THA surgery.  PAIN: Are you having pain? Yes: NPRS scale: 1/10 at rest and with most movements, up to 2/10 at worst in the past week Pain location: L lower buttock Pain description: discomfort Aggravating factors: hip exercises  Relieving factors: Tylenol  in am, heating pad at night  PERTINENT HISTORY:  Anxiety, OA, asthma, depression, DOE, GERD, HLD, IBS, LBP, obesity, SVT and vertigo.   PRECAUTIONS: Anterior hip  RED FLAGS: None  WEIGHT BEARING RESTRICTIONS: No  FALLS:  Has patient fallen in last 6 months? Yes. Number of falls 1   LIVING ENVIRONMENT: Lives with: lives with their family Lives in: House Stairs: Yes: Internal: to basement - 12 steps; on right going up, on left going up, and can reach both and External: 2+1 steps; on left going up Has following equipment at home: Single point cane, Walker - 2 wheeled, Environmental consultant - 4 wheeled, and shower chair  OCCUPATION: Works from home  PLOF: Independent and Leisure: gardening (not this year but wants to get back to that next year), [employer] water exercises   PATIENT GOALS: To walk w/o a walker or cane with a steady gait.   OBJECTIVE: (objective measures completed at initial evaluation unless otherwise dated)  DIAGNOSTIC FINDINGS:  02/26/24 - DG Pelvis IMPRESSION: 1. Left hip arthroplasty without immediate postoperative complication.  PATIENT SURVEYS:  ABC scale: The Activities-Specific Balance Confidence (ABC) Scale 0% 10 20 30  40 50 60  70 80 90 100% No confidence<->completely confident  "How confident are you that you will not lose your balance or become unsteady when you . . .  Date tested: 03/16/2024   Walk around the house 70%  2. Walk up or down stairs 50%  3. Bend over and pick  up a slipper from in front of a closet floor 30%  4. Reach for a small can off a shelf at eye level 50%  5. Stand on tip toes and reach for something above your head 30%  6. Stand on a chair and reach for something 0%  7. Sweep the floor 40%  8. Walk outside the house to a car parked in the driveway 40%  9. Get into or out of a car 40%  10. Walk across a parking lot to the mall 30%  11. Walk up or down a ramp 60%  12. Walk in a crowded mall where people rapidly walk past you 30%  13. Are bumped into by people as you walk through the mall 30%  14. Step onto or off of an escalator while you are holding onto the railing 20%  15. Step onto or off an escalator while holding onto parcels such that you cannot hold onto the railing 0%  16. Walk outside on icy sidewalks 0%  Total: #/16 520 / 1600 = 32.5 %  Level of physical functioning:  Low    LEFS  Extreme difficulty/unable (0), Quite a bit of difficulty (1), Moderate difficulty (2), Little difficulty (3), No difficulty (4) Survey date:  03/16/2024   Any of your usual work, housework or school activities 2  2. Usual hobbies, recreational or sporting activities 3  3. Getting into/out of the bath 1  4. Walking between rooms 4  5. Putting on socks/shoes 2  6. Squatting  0  7. Lifting an object, like a bag of groceries from the floor 0  8. Performing light activities around your home 2  9. Performing heavy activities around your home 0  10. Getting into/out of a car 2  11. Walking 2 blocks 0  12. Walking 1 mile 0  13. Going up/down 10 stairs (1 flight) 2  14. Standing for 1 hour 0  15.  sitting for 1 hour 2  16. Running on even ground 0  17. Running on uneven ground 0  18. Making sharp turns while running fast 0  19. Hopping  0  20. Rolling over in bed 1  Score total:   21 / 80 = 26.3 %  Functional Limitation:  Moderate    COGNITION: Overall cognitive status: Within functional limits for tasks assessed    SENSATION: WFL Except  numbness along surgical incision  POSTURE:  Apparent ~1 cm leg length discrepancy (L longer than R) with L iliac crest sitting higher  MUSCLE LENGTH: (TBA as indicated) Hamstrings:  ITB:  Piriformis:  Hip flexors:  Quads:  Heelcord:   LOWER EXTREMITY ROM:  Active ROM Right eval Left eval  Hip flexion    Hip extension    Hip abduction    Hip adduction    Hip internal rotation    Hip external rotation    Knee flexion    Knee extension    Ankle dorsiflexion    Ankle plantarflexion    Ankle inversion    Ankle eversion    (Blank rows = not tested)  LOWER EXTREMITY MMT: (all testing in sitting on eval)  MMT Right eval Left eval  Hip flexion 4+ 3-  Hip extension 4- 3+  Hip abduction 4 3+  Hip adduction 4 3+  Hip internal rotation 4 2+  Hip external rotation 4 3+  Knee flexion 5 5  Knee extension 5 4  Ankle dorsiflexion 4- 4-  Ankle plantarflexion    Ankle inversion    Ankle eversion     (Blank rows = not tested)  FUNCTIONAL TESTS:  5 times sit to stand: 18.97 sec Timed up and go (TUG): 17.69 with 4WW; 15.66 sec w/o AD 10 meter walk test: 13.60 sec with 4WW; 16.00 sec w/o AD Berg Balance Scale: TBA DGI vs FGA: TBA  GAIT: Distance walked: clinic distances Assistive device utilized: Environmental consultant - 4 wheeled and None Level of assistance: Modified independence and SBA Gait pattern: step through pattern, lateral hip instability with L lateral lean to compensate for R hip drop/Trendelenburg     TODAY'S TREATMENT:   03/16/2024  SELF CARE:  Reviewed eval findings and role of PT in addressing identified deficits as well as review of HH PT HEP:  Supine heel slide Supine hip ABD/ADD Hookying SLR Hooklying SAQ Standing hip ABD Standing hip extension Standing heel raises - instructed pt to add alternating toe raises   PATIENT EDUCATION:  Education details: PT eval findings, anticipated POC, need for further assessment of Berg and DGI vs FGA, HEP review, and continue  with current HEP  Person educated: Patient Education method: Explanation and Verbal cues Education comprehension: verbalized understanding  HOME EXERCISE PROGRAM: 03/16/24 - Reviewed HH PT HEP - will establish formal HEP next visit Supine heel slide Supine hip ABD/ADD Hookying SLR Hooklying SAQ Standing hip ABD Standing hip extension Standing heel raises - instructed pt to add alternating toe raises   ASSESSMENT:  CLINICAL IMPRESSION: Dhruti Ghuman is a 68 y.o. female who was referred to physical therapy for evaluation and treatment s/p L hip THA via anterior approach on 02/26/2024.  Patient reports continued mild L hip pain since surgery, but not as intense as before surgery.  Pain is worse when she is working on her [facility] PT exercises.  Patient has deficits in L hip ROM, B LE flexibility, BLE strength, abnormal posture with potential leg length discrepancy, and altered gait pattern which are interfering with ADLs and are impacting quality of life.  On LEFS patient scored 21/80 demonstrating moderate functional limitation.  She expresses a significant concern regarding fear of falling, with ABC scale revealing only 32.5% balance confidence, indicative of a low level of physical functioning, therefore we will plan for further standardized balance testing next visit.  Wynetta Seith will benefit from skilled PT to address above deficits to improve mobility and activity tolerance with decreased pain interference and decreased risk for falls.  OBJECTIVE IMPAIRMENTS: Abnormal gait, decreased activity tolerance, decreased balance, decreased coordination, decreased knowledge of condition, decreased knowledge of use of DME, decreased mobility, difficulty walking, decreased ROM, decreased strength, increased fascial restrictions, impaired perceived functional ability, increased muscle spasms, impaired flexibility, impaired sensation, improper body mechanics, postural dysfunction, and pain.   ACTIVITY  LIMITATIONS: carrying, lifting, bending, sitting, standing, squatting, sleeping, stairs, transfers, bed mobility, bathing, dressing, and locomotion level  PARTICIPATION LIMITATIONS: meal prep, cleaning, laundry, driving, shopping, community activity, occupation, and yard work  PERSONAL FACTORS: Fitness, Past/current experiences, Profession, Time since onset of injury/illness/exacerbation, and 3+ comorbidities: Anxiety, OA, asthma, depression, DOE, GERD, HLD, IBS, LBP, obesity, SVT and vertigo are also affecting patient's functional outcome.   REHAB POTENTIAL: Good  CLINICAL DECISION MAKING:  Evolving/moderate complexity  EVALUATION COMPLEXITY: Moderate   GOALS: Goals reviewed with patient? Yes  SHORT TERM GOALS: Target date: 04/13/2024  Patient will be independent with initial HEP. Baseline: Reviewed HH PT HEP on eval Goal status: INITIAL  2.  Patient will report at least 25% improvement in L hip pain to improve QOL. Baseline: 1-2/10 Goal status: INITIAL  3.  Complete standardized balance testing and update LTG's as indicated Baseline: Berg and FGA vs DGI pending Goal status: INITIAL  4.  Patient will improve 5x STS time to </= 15 seconds to demonstrate improved functional strength and transfer efficiency  Baseline: 18.97 sec Goal status: INITIAL  5.  Patient will demonstrate decreased TUG time to </= 13.5 sec to decrease risk for falls with transitional mobility Baseline: 17.69 with 4WW; 15.66 sec w/o AD Goal status: INITIAL    LONG TERM GOALS: Target date: 05/11/2024  Patient will be independent with advanced/ongoing HEP to improve outcomes and carryover.  Baseline:  Goal status: INITIAL  2.  Patient will report at least 50-75% improvement in L hip pain to improve QOL. Baseline: 1-2/10 Goal status: INITIAL  3.  Patient will demonstrate improved B LE strength to >/= 4 to 4+/5 for improved stability and ease of mobility. Baseline: Refer to above LE MMT table Goal  status: INITIAL  4.  Patient will be able to ambulate 600' with or w/o LRAD and normal gait pattern without increased pain to access community.  Baseline: Currently reliant on 4WW for almost all ambulation - limited distances Goal status: INITIAL  5.  Patient will report >/= 40/80 on LEFS (MCID = 9 pts) to demonstrate improved functional ability. Baseline:  21 / 80 = 26.3 % Goal status: INITIAL  6.  Patient will report >/= 50% average balance confidence on ABC to demonstrate at least moderate level of physical functioning and decreased risk for falls. Baseline: 520 / 1600 = 32.5 %, low level of physical functioning Goal status: INITIAL   7.  Patient will demonstrate at least 19/24 on DGI or 19/30 on FGA to decrease risk of falls. Baseline: TBA Goal status: INITIAL    PLAN:  PT FREQUENCY: 2x/week  PT DURATION: 8 weeks  PLANNED INTERVENTIONS: 02835- PT Re-evaluation, 97750- Physical Performance Testing, 97110-Therapeutic exercises, 97530- Therapeutic activity, 97112- Neuromuscular re-education, [phone]- Self Care, 02859- Manual therapy, [phone]- Gait training, [phone]- Aquatic Therapy, [phone]- Electrical stimulation (unattended), [phone]- Ultrasound, [id number]- Ionotophoresis 4mg /ml Dexamethasone, [id number] (1-2 muscles), [id number] (3+ muscles)- Dry Needling, Patient/Family education, Balance training, Stair training, Taping, Joint mobilization, Scar mobilization, DME instructions, Cryotherapy, and Moist heat  PLAN FOR NEXT SESSION: Complete standardized balance testing - Berg and DGI vs FGA; go through [facility] PT HEP with patient, adding resistance or modifying as indicated to progress exercises; re-evaluate potential leg length discrepancy at ~6 weeks postop   Elijah CHRISTELLA Hidden, PT 03/16/2024, 10:14 AM

## 2024-03-16 ENCOUNTER — Encounter: Payer: Self-pay | Admitting: Physical Therapy

## 2024-03-16 ENCOUNTER — Other Ambulatory Visit: Payer: Self-pay

## 2024-03-16 ENCOUNTER — Ambulatory Visit: Attending: Orthopaedic Surgery | Admitting: Physical Therapy

## 2024-03-16 DIAGNOSIS — R2689 Other abnormalities of gait and mobility: Secondary | ICD-10-CM | POA: Insufficient documentation

## 2024-03-16 DIAGNOSIS — M6281 Muscle weakness (generalized): Secondary | ICD-10-CM | POA: Insufficient documentation

## 2024-03-16 DIAGNOSIS — Z96642 Presence of left artificial hip joint: Secondary | ICD-10-CM | POA: Diagnosis not present

## 2024-03-16 DIAGNOSIS — M25552 Pain in left hip: Secondary | ICD-10-CM | POA: Diagnosis present

## 2024-03-16 DIAGNOSIS — R2681 Unsteadiness on feet: Secondary | ICD-10-CM | POA: Insufficient documentation

## 2024-03-16 NOTE — Discharge Summary (Signed)
 Patient ID: Tina Hinton MRN: [id number] DOB/AGE: [birth year] 68 y.o.  Admit date: 02/26/2024 Discharge date: 02/28/2024  Admission Diagnoses:  Active Problems:   Status post total replacement of left hip   Discharge Diagnoses:  Same  Past Medical History:  Diagnosis Date   Ankle fracture    Right   Anxiety    Arthritis    hands, knees   Asthma    Bilateral knee pain    Cancer (HCC)    skin cancer   Chest pain    Chicken pox    Clavicle fracture    Left   Complication of anesthesia    Depression    Dysrhythmia    Foot fracture, left    GERD (gastroesophageal reflux disease)    History of hiatal hernia    Hyperlipidemia    IBS (irritable bowel syndrome) 01/19/2017   Joint pain    Low back pain    Osteoarthritis    Other fatigue    Palpitations    Panic attacks    Seasonal allergies    Shortness of breath on exertion    SVT (supraventricular tachycardia)    Swallowing difficulty    Uterine polyp    ablation    Surgeries: Procedure(s): ARTHROPLASTY, HIP, TOTAL, ANTERIOR APPROACH on 02/26/2024   Consultants:   Discharged Condition: Improved  Hospital Course: Tina Hinton is an 68 y.o. female who was admitted 02/26/2024 for operative treatment ofUnilateral primary osteoarthritis, left hip. Patient has severe unremitting pain that affects sleep, daily activities, and work/hobbies. After pre-op clearance the patient was taken to the operating room on 02/26/2024 and underwent  Procedure(s): ARTHROPLASTY, HIP, TOTAL, ANTERIOR APPROACH.    Patient was given perioperative antibiotics:  Anti-infectives (From admission, onward)    Start     Dose/Rate Route Frequency Ordered Stop   02/26/24 1700  ceFAZolin  (ANCEF ) IVPB 2g/100 mL premix        2 g 200 mL/hr over 30 Minutes Intravenous Every 6 hours 02/26/24 1341 02/27/24 0200   02/26/24 0900  ceFAZolin  (ANCEF ) IVPB 2g/100 mL premix        2 g 200 mL/hr over 30 Minutes Intravenous On call to O.R. 02/26/24  9145 02/26/24 1108        Patient was given sequential compression devices, early ambulation, and chemoprophylaxis to prevent DVT.  Inpatient Morphine  Milligram Equivalents Per Day 9/19 - 9/21   Values displayed are in units of MME/Day    Order Start / End Date 9/19 9/20 9/21    oxyCODONE  (Oxy IR/ROXICODONE ) immediate release tablet 5 mg 9/19 - 9/19 7.5 of Unknown -- --    oxyCODONE  (ROXICODONE ) 5 MG/5ML solution 5 mg 9/19 - 9/19 0 of Unknown -- --      Group total: 7.5 of Unknown      fentaNYL  (SUBLIMAZE ) injection 9/19 - 9/19 *30 of 30 -- --    HYDROmorphone  (DILAUDID ) injection 0.5-1 mg 9/19 - 9/21 50 of 30-60 20 of 60-120 0 of 40-80    oxyCODONE  (Oxy IR/ROXICODONE ) immediate release tablet 5-10 mg 9/19 - 9/21 22.5 of 22.5-45 45 of 45-90 30 of 30-60    oxyCODONE  (Oxy IR/ROXICODONE ) immediate release tablet 10-15 mg 9/19 - 9/21 0 of 45-67.5 0 of 90-135 0 of 60-90    fentaNYL  (SUBLIMAZE ) injection 25-50 mcg 9/19 - 9/19 0 of 45-90 -- --    Daily Totals  * 110 of Unknown (at least 172.5-292.5) 65 of 195-345 30 of 130-230  *One-Step medication  Calculation Errors  Order Type Date Details   oxyCODONE  (Oxy IR/ROXICODONE ) immediate release tablet 5 mg Ordered Dose -- Insufficient frequency information   oxyCODONE  (ROXICODONE ) 5 MG/5ML solution 5 mg Ordered Dose -- Insufficient frequency information            Patient benefited maximally from hospital stay and there were no complications.    Recent vital signs: No data found.   Recent laboratory studies: No results for input(s): WBC, HGB, HCT, PLT, NA, K, CL, CO2, BUN, CREATININE, GLUCOSE, INR, CALCIUM  in the last 72 hours.  Invalid input(s): PT, 2   Discharge Medications:   Allergies as of 02/28/2024       Reactions   Other Rash   Entex LA   Phenylephrine -guaifenesin Rash        Medication List     TAKE these medications    aspirin  81 MG chewable tablet Chew 1 tablet (81 mg total) by  mouth 2 (two) times daily. What changed: when to take this   cyclobenzaprine  10 MG tablet Commonly known as: FLEXERIL  Take 1 tablet (10 mg total) by mouth 3 (three) times daily as needed for muscle spasms. What changed: when to take this   diclofenac  75 MG EC tablet Commonly known as: VOLTAREN  TAKE 1 TABLET BY MOUTH TWICE A DAY   fenofibrate  160 MG tablet Take 1 tablet (160 mg total) by mouth daily.   lansoprazole 30 MG capsule Commonly known as: PREVACID Take 30 mg by mouth 2 (two) times daily.   loratadine  10 MG tablet Commonly known as: CLARITIN  Take 10 mg by mouth daily.   LORazepam  0.5 MG tablet Commonly known as: Ativan  Take 1 tablet (0.5 mg total) by mouth every 8 (eight) hours. What changed:  when to take this reasons to take this   MAGNESIUM PO Take 1 tablet by mouth daily.   methocarbamol  500 MG tablet Commonly known as: ROBAXIN  Take 1 tablet (500 mg total) by mouth every 6 (six) hours as needed for muscle spasms.   multivitamin with minerals Tabs tablet Take 1 tablet by mouth daily.   oxyCODONE  5 MG immediate release tablet Commonly known as: Oxy IR/ROXICODONE  Take 1-2 tablets (5-10 mg total) by mouth every 6 (six) hours as needed for moderate pain (pain score 4-6) (pain score 4-6).   PARoxetine  10 MG tablet Commonly known as: PAXIL  Take 1 tablet (10 mg total) by mouth daily.   Qvar  RediHaler 40 MCG/ACT inhaler Generic drug: beclomethasone Inhale 2 puffs into the lungs 2 (two) times daily. What changed:  when to take this reasons to take this   SEMAGLUTIDE [place] Inject 40 Units into the skin every 7 (seven) days.   Vitamin D  (Ergocalciferol ) 1.25 MG (50000 UNIT) Caps capsule Commonly known as: DRISDOL  Take 1 capsule (50,000 Units total) by mouth every 7 (seven) days.        Diagnostic Studies: DG Pelvis Portable Result Date: 02/26/2024 EXAM: 1 or 2 VIEW(S) XRAY OF THE PELVIS 02/26/2024 01:05:00 PM COMPARISON: None available. CLINICAL HISTORY:  Status post total replacement of left hip. S/p total left hip replacement. FINDINGS: BONES AND JOINTS: Left hip arthroplasty in expected alignment. No periprosthetic lucency or fracture. No acute fracture. No focal osseous lesion. No joint dislocation. SOFT TISSUES: Recent postsurgical change includes air and edema in the soft tissues. IMPRESSION: 1. Left hip arthroplasty without immediate postoperative complication. Electronically signed by: Andrea Gasman MD 02/26/2024 02:41 PM EDT RP Workstation: [id number]   DG HIP UNILAT WITH PELVIS 1V LEFT Result Date: 02/26/2024 EXAM:  1 VIEW XRAY OF THE LEFT HIP 02/26/2024 12:03:00 PM COMPARISON: None available. CLINICAL HISTORY: Elective surgery. FINDINGS: BONES AND JOINTS: 3 fluoroscopic views of the pelvis and left hip submitted from the operating room. Left hip arthroplasty in place. Fluoroscopy time 14 seconds. Fluoroscopy dose 2.28 mgy. IMPRESSION: 1. Procedural fluoroscopy during left hip arthroplasty. Electronically signed by: Andrea Gasman MD 02/26/2024 02:40 PM EDT RP Workstation: [id number]   DG C-Arm 1-60 Min-No Report Result Date: 02/26/2024 Fluoroscopy was utilized by the requesting physician.  No radiographic interpretation.    Disposition: Discharge disposition: 01-Home or Self Care       Discharge Instructions     Call MD / Call 911   Complete by: As directed    If you experience chest pain or shortness of breath, CALL 911 and be transported to the hospital emergency room.  If you develope a fever above 101 F, pus (white drainage) or increased drainage or redness at the wound, or calf pain, call your surgeon's office.   Constipation Prevention   Complete by: As directed    Drink plenty of fluids.  Prune juice may be helpful.  You may use a stool softener, such as Colace (over the counter) 100 mg twice a day.  Use MiraLax (over the counter) for constipation as needed.   Diet - low sodium heart healthy   Complete by: As directed     Increase activity slowly as tolerated   Complete by: As directed    Post-operative opioid taper instructions:   Complete by: As directed    POST-OPERATIVE OPIOID TAPER INSTRUCTIONS: It is important to wean off of your opioid medication as soon as possible. If you do not need pain medication after your surgery it is ok to stop day one. Opioids include: Codeine, Hydrocodone (Norco, Vicodin), Oxycodone (Percocet, oxycontin ) and hydromorphone  amongst others.  Long term and even short term use of opiods can cause: Increased pain response Dependence Constipation Depression Respiratory depression And more.  Withdrawal symptoms can include Flu like symptoms Nausea, vomiting And more Techniques to manage these symptoms Hydrate well Eat regular healthy meals Stay active Use relaxation techniques(deep breathing, meditating, yoga) Do Not substitute Alcohol to help with tapering If you have been on opioids for less than two weeks and do not have pain than it is ok to stop all together.  Plan to wean off of opioids This plan should start within one week post op of your joint replacement. Maintain the same interval or time between taking each dose and first decrease the dose.  Cut the total daily intake of opioids by one tablet each day Next start to increase the time between doses. The last dose that should be eliminated is the evening dose.           Follow-up Information     Vernetta Lonni GRADE, MD Follow up in 2 week(s).   Specialty: Orthopedic Surgery Contact information: [address] [place]  [place] [address] [phone]         [facility]  Follow up.   Why: [facility] will provide PT in the home after discharge.                 Signed: Lonni GRADE Vernetta 03/16/2024, 9:20 AM

## 2024-03-18 ENCOUNTER — Encounter: Payer: Self-pay | Admitting: Physical Therapy

## 2024-03-18 ENCOUNTER — Ambulatory Visit: Admitting: Physical Therapy

## 2024-03-18 DIAGNOSIS — M6281 Muscle weakness (generalized): Secondary | ICD-10-CM | POA: Diagnosis not present

## 2024-03-18 DIAGNOSIS — M25552 Pain in left hip: Secondary | ICD-10-CM

## 2024-03-18 DIAGNOSIS — R2681 Unsteadiness on feet: Secondary | ICD-10-CM

## 2024-03-18 DIAGNOSIS — R2689 Other abnormalities of gait and mobility: Secondary | ICD-10-CM

## 2024-03-18 NOTE — Therapy (Signed)
 OUTPATIENT PHYSICAL THERAPY TREATMENT   Patient Name: Kaysen Sefcik MRN: [id number] DOB:[birth year], 69 y.o., female Today's Date: 03/18/2024   END OF SESSION:  PT End of Session - 03/18/24 0846     Visit Number 2    Date for Recertification  05/11/24    Authorization Type Federal [employer]    PT Start Time [phone]    PT Stop Time [phone]    PT Time Calculation (min) 45 min    Activity Tolerance Patient tolerated treatment well    Behavior During Therapy [facility] for tasks assessed/performed           Past Medical History:  Diagnosis Date   Ankle fracture    Right   Anxiety    Arthritis    hands, knees   Asthma    Bilateral knee pain    Cancer (HCC)    skin cancer   Chest pain    Chicken pox    Clavicle fracture    Left   Complication of anesthesia    Depression    Dysrhythmia    Foot fracture, left    GERD (gastroesophageal reflux disease)    History of hiatal hernia    Hyperlipidemia    IBS (irritable bowel syndrome) 01/19/2017   Joint pain    Low back pain    Osteoarthritis    Other fatigue    Palpitations    Panic attacks    Seasonal allergies    Shortness of breath on exertion    SVT (supraventricular tachycardia)    Swallowing difficulty    Uterine polyp    ablation   Past Surgical History:  Procedure Laterality Date   BIOSPY     HEAD   CESAREAN SECTION     COLONOSCOPY     ENDOMETRIAL ABLATION     MANDIBLE SURGERY     TONSILLECTOMY     TOTAL HIP ARTHROPLASTY Left 02/26/2024   Procedure: ARTHROPLASTY, HIP, TOTAL, ANTERIOR APPROACH;  Surgeon: Vernetta Lonni GRADE, MD;  Location: [facility];  Service: Orthopedics;  Laterality: Left;   TUBAL LIGATION     UPPER GASTROINTESTINAL ENDOSCOPY     Patient Active Problem List   Diagnosis Date Noted   Status post total replacement of left hip 02/26/2024   Claustrophobia 10/15/2023   Vitamin D  deficiency 06/25/2021   Class 3 severe obesity with serious comorbidity and body mass index (BMI) of 45.0 to 49.9  in adult (HCC) 03/12/2021   Prediabetes 03/12/2021   Lumbar strain, subsequent encounter 02/14/2021   Asthma    Vertigo 01/31/2020   Aortic atherosclerosis 02/15/2018   IBS (irritable bowel syndrome) - D 01/19/2017   Primary osteoarthritis of both knees 01/19/2017   Preventative health care 07/30/2015   Obesity, unspecified 07/30/2015   Anxiety 06/25/2015   Hyperlipidemia LDL goal <100 06/25/2015   Sprain of medial collateral ligament of left knee 06/25/2015   Osteoarthritis of both knees 06/25/2015   Degeneration of intervertebral disc of lumbar region 01/04/2015   Spondylolisthesis at L5-S1 level 01/04/2015   Fracture of lateral malleolus 11/14/2014   Foot, fracture, navicular 11/14/2014    PCP: Antonio Cyndee Jamee JONELLE, DO   REFERRING PROVIDER: Vernetta Lonni GRADE, MD  REFERRING DIAG: [phone] (ICD-10-CM) - Status post total replacement of left hip   THERAPY DIAG:  Muscle weakness (generalized)  Pain in left hip  Unsteadiness on feet  Other abnormalities of gait and mobility  RATIONALE FOR EVALUATION AND TREATMENT: Rehabilitation  ONSET DATE: 02/26/2024 - L THA (anterior approach)  NEXT MD VISIT: 04/07/2024   SUBJECTIVE:                                                                                                                                                                                                         SUBJECTIVE STATEMENT: Pt reports she was able to sleep on her R side with the pillow between her knees last night - she states she slept so much better.  She has also been walking more using the [facility] rather than the 4WW.  She states she has not needed any pain meds for the past 2 days.  EVAL: Patient is ~2.5 weeks status post L THA via anterior approach on 02/26/2024.  She initially had 2 weeks of HH PT which was completed on 03/11/24.  She continues to experience what she feels is bone pain/discomfort in the L hip but nothing like than pain she had before  surgery.  Also notes numbness along the surgical incision.  Still using the 4WW full time due to fear of falling - she states she has been using the 4WW or cane for the past 9 months due to limited standing/walking tolerance and fear of falling.  She would like to eventually try to wean from all ADs.  Sleeping has been difficult as she normally sleeps on her side and cannot comfortably right now - sleeping on her back makes her back hurt.  She thinks she may have a leg length discrepancy following the THA surgery.  PAIN: Are you having pain? Yes: NPRS scale: 0.5/10 with movement  Pain location: L lower buttock Pain description: discomfort Aggravating factors: hip exercises  Relieving factors: Tylenol  in am, heating pad at night  PERTINENT HISTORY:  Anxiety, OA, asthma, depression, DOE, GERD, HLD, IBS, LBP, obesity, SVT and vertigo.   PRECAUTIONS: Anterior hip  RED FLAGS: None  WEIGHT BEARING RESTRICTIONS: No  FALLS:  Has patient fallen in last 6 months? Yes. Number of falls 1   LIVING ENVIRONMENT: Lives with: lives with their family Lives in: House Stairs: Yes: Internal: to basement - 12 steps; on right going up, on left going up, and can reach both and External: 2+1 steps; on left going up Has following equipment at home: Single point cane, Walker - 2 wheeled, Environmental consultant - 4 wheeled, and shower chair  OCCUPATION: Works from home  PLOF: Independent and Leisure: gardening (not this year but wants to get back to that next year), [employer] water exercises   PATIENT GOALS: To walk w/o a walker or cane with a steady gait.  OBJECTIVE: (objective measures completed at initial evaluation unless otherwise dated)  DIAGNOSTIC FINDINGS:  02/26/24 - DG Pelvis IMPRESSION: 1. Left hip arthroplasty without immediate postoperative complication.  PATIENT SURVEYS:  ABC scale: The Activities-Specific Balance Confidence (ABC) Scale 0% 10 20 30  40 50 60 70 80 90 100% No confidence<->completely  confident  "How confident are you that you will not lose your balance or become unsteady when you . . .  Date tested: 03/16/2024   Walk around the house 70%  2. Walk up or down stairs 50%  3. Bend over and pick up a slipper from in front of a closet floor 30%  4. Reach for a small can off a shelf at eye level 50%  5. Stand on tip toes and reach for something above your head 30%  6. Stand on a chair and reach for something 0%  7. Sweep the floor 40%  8. Walk outside the house to a car parked in the driveway 40%  9. Get into or out of a car 40%  10. Walk across a parking lot to the mall 30%  11. Walk up or down a ramp 60%  12. Walk in a crowded mall where people rapidly walk past you 30%  13. Are bumped into by people as you walk through the mall 30%  14. Step onto or off of an escalator while you are holding onto the railing 20%  15. Step onto or off an escalator while holding onto parcels such that you cannot hold onto the railing 0%  16. Walk outside on icy sidewalks 0%  Total: #/16 520 / 1600 = 32.5 %  Level of physical functioning:  Low    LEFS  Extreme difficulty/unable (0), Quite a bit of difficulty (1), Moderate difficulty (2), Little difficulty (3), No difficulty (4) Survey date:  03/16/2024   Any of your usual work, housework or school activities 2  2. Usual hobbies, recreational or sporting activities 3  3. Getting into/out of the bath 1  4. Walking between rooms 4  5. Putting on socks/shoes 2  6. Squatting  0  7. Lifting an object, like a bag of groceries from the floor 0  8. Performing light activities around your home 2  9. Performing heavy activities around your home 0  10. Getting into/out of a car 2  11. Walking 2 blocks 0  12. Walking 1 mile 0  13. Going up/down 10 stairs (1 flight) 2  14. Standing for 1 hour 0  15.  sitting for 1 hour 2  16. Running on even ground 0  17. Running on uneven ground 0  18. Making sharp turns while running fast 0  19. Hopping  0   20. Rolling over in bed 1  Score total:   21 / 80 = 26.3 %  Functional Limitation:  Moderate    COGNITION: Overall cognitive status: Within functional limits for tasks assessed    SENSATION: WFL Except numbness along surgical incision  POSTURE:  Apparent ~1 cm leg length discrepancy (L longer than R) with L iliac crest sitting higher  MUSCLE LENGTH: (TBA as indicated) Hamstrings:  ITB:  Piriformis:  Hip flexors:  Quads:  Heelcord:   LOWER EXTREMITY ROM:  Active ROM Right eval Left eval  Hip flexion    Hip extension    Hip abduction    Hip adduction    Hip internal rotation    Hip external rotation    Knee flexion  Knee extension    Ankle dorsiflexion    Ankle plantarflexion    Ankle inversion    Ankle eversion    (Blank rows = not tested)  LOWER EXTREMITY MMT: (all testing in sitting on eval)  MMT Right eval Left eval  Hip flexion 4+ 3-  Hip extension 4- 3+  Hip abduction 4 3+  Hip adduction 4 3+  Hip internal rotation 4 2+  Hip external rotation 4 3+  Knee flexion 5 5  Knee extension 5 4  Ankle dorsiflexion 4- 4-  Ankle plantarflexion    Ankle inversion    Ankle eversion     (Blank rows = not tested)  FUNCTIONAL TESTS:  5 times sit to stand: 18.97 sec Timed up and go (TUG): 17.69 with 4WW; 15.66 sec w/o AD 10 meter walk test: 13.60 sec with 4WW; 16.00 sec w/o AD Berg Balance Scale: 03/18/24 - 47/56, indicating moderate (>50%) risk for falls FGA: 03/18/24 - 14/30, indicating high risk for falls  GAIT: Distance walked: clinic distances Assistive device utilized: Environmental consultant - 4 wheeled and None Level of assistance: Modified independence and SBA Gait pattern: step through pattern, lateral hip instability with L lateral lean to compensate for R hip drop/Trendelenburg     TODAY'S TREATMENT:   03/18/2024 PHYSICAL PERFORMANCE TEST or MEASUREMENT:  Berg Balance Test   Sit to Stand Able to stand without using hands and stabilize independently     Standing Unsupported Able to stand safely 2 minutes    Sitting with Back Unsupported but Feet Supported on Floor or Stool Able to sit safely and securely 2 minutes    Stand to Sit Sits safely with minimal use of hands    Transfers Able to transfer safely, minor use of hands    Standing Unsupported with Eyes Closed Able to stand 10 seconds safely    Standing Unsupported with Feet Together Able to place feet together independently and stand 1 minute safely    From Standing, Reach Forward with Outstretched Arm Can reach forward >12 cm safely (5)    From Standing Position, Pick up Object from Floor Able to pick up shoe, needs supervision    From Standing Position, Turn to Look Behind Over each Shoulder Looks behind from both sides and weight shifts well    Turn 360 Degrees Able to turn 360 degrees safely but slowly    Standing Unsupported, Alternately Place Feet on Step/Stool Able to stand independently and complete 8 steps >20 seconds    Standing Unsupported, One Foot in Front Able to plae foot ahead of the other independently and hold 30 seconds    Standing on One Leg Tries to lift leg/unable to hold 3 seconds but remains standing independently    Total Score 47    Berg comment: 46-51 = Moderate (>50%) fall risk      Functional Gait  Assessment   Gait Level Surface Walks 20 ft, slow speed, abnormal gait pattern, evidence for imbalance or deviates 10-15 in outside of the 12 in walkway width. Requires more than 7 sec to ambulate 20 ft.    Change in Gait Speed Able to change speed, demonstrates mild gait deviations, deviates 6-10 in outside of the 12 in walkway width, or no gait deviations, unable to achieve a major change in velocity, or uses a change in velocity, or uses an assistive device.    Gait with Horizontal Head Turns Performs head turns smoothly with slight change in gait velocity (eg, minor disruption to smooth  gait path), deviates 6-10 in outside 12 in walkway width, or uses an assistive  device.    Gait with Vertical Head Turns Performs head turns with no change in gait. Deviates no more than 6 in outside 12 in walkway width.    Gait and Pivot Turn Pivot turns safely in greater than 3 sec and stops with no loss of balance, or pivot turns safely within 3 sec and stops with mild imbalance, requires small steps to catch balance.    Step Over Obstacle Is able to step over one shoe box (4.5 in total height) but must slow down and adjust steps to clear box safely. May require verbal cueing.    Gait with Narrow Base of Support Ambulates less than 4 steps heel to toe or cannot perform without assistance.    Gait with Eyes Closed Walks 20 ft, slow speed, abnormal gait pattern, evidence for imbalance, deviates 10-15 in outside 12 in walkway width. Requires more than 9 sec to ambulate 20 ft.    Ambulating Backwards Walks 20 ft, slow speed, abnormal gait pattern, evidence for imbalance, deviates 10-15 in outside 12 in walkway width.    Steps Two feet to a stair, must use rail.    Total Score 14    FGA comment: < 19 = high risk fall      THERAPEUTIC EXERCISE: To improve strength, endurance, ROM, and flexibility.  Demonstration, verbal and tactile cues throughout for technique.  NuStep - L3 x 6 min (UE/LE) Standing hip ABD x 10 bil, UE support on back of chair for balance  Standing hip extension x 10 bil, UE support on back of chair for balance  Standing hip flexion march 2# x 10 bil, UE support on back of chair for balance  Standing heel & toe raises x 20   03/16/2024  SELF CARE:  Reviewed eval findings and role of PT in addressing identified deficits as well as review of HH PT HEP:  Supine heel slide Supine hip ABD/ADD Hookying SLR Hooklying SAQ Standing hip ABD Standing hip extension Standing heel raises - instructed pt to add alternating toe raises   PATIENT EDUCATION:  Education details: PT eval findings, anticipated POC, need for further assessment of Berg and DGI vs FGA, HEP  review, and continue with current HEP  Person educated: Patient Education method: Explanation and Verbal cues Education comprehension: verbalized understanding  HOME EXERCISE PROGRAM: 03/16/24 - Reviewed HH PT HEP - will establish formal HEP next visit Supine heel slide Supine hip ABD/ADD Hookying SLR Hooklying SAQ Standing hip ABD Standing hip extension Standing heel raises - instructed pt to add alternating toe raises   ASSESSMENT:  CLINICAL IMPRESSION: Buffi Ewton reports her pain has been lessening this week.  She has started using her cane more than her 4WW and has been able to sleep on her R side last night.  Standardized balance testing completed with Berg score score of 47/56 indicating moderate (>50%) risk for falls and FGA score of 14/30 indicating high risk for falls.  Completed review of HH PT HEP standing exercises providing guidance on progression of exercises at home using 2# ankle cuff weights.  Will continue to progress strengthening and balance activities in upcoming visits, creating OP PT HEP as indicated.  Vanity Larsson will benefit from continued skilled PT to address ongoing range of motion, coordination, strength and balance deficits to improve mobility and activity tolerance with decreased pain interference and decreased risk for falls.   EVAL: Anyiah Coverdale is  a 68 y.o. female who was referred to physical therapy for evaluation and treatment s/p L hip THA via anterior approach on 02/26/2024.  Patient reports continued mild L hip pain since surgery, but not as intense as before surgery.  Pain is worse when she is working on her [facility] PT exercises.  Patient has deficits in L hip ROM, B LE flexibility, BLE strength, abnormal posture with potential leg length discrepancy, and altered gait pattern which are interfering with ADLs and are impacting quality of life.  On LEFS patient scored 21/80 demonstrating moderate functional limitation.  She expresses a significant  concern regarding fear of falling, with ABC scale revealing only 32.5% balance confidence, indicative of a low level of physical functioning, therefore we will plan for further standardized balance testing next visit.  Jaslynne Dahan will benefit from skilled PT to address above deficits to improve mobility and activity tolerance with decreased pain interference and decreased risk for falls.  OBJECTIVE IMPAIRMENTS: Abnormal gait, decreased activity tolerance, decreased balance, decreased coordination, decreased knowledge of condition, decreased knowledge of use of DME, decreased mobility, difficulty walking, decreased ROM, decreased strength, increased fascial restrictions, impaired perceived functional ability, increased muscle spasms, impaired flexibility, impaired sensation, improper body mechanics, postural dysfunction, and pain.   ACTIVITY LIMITATIONS: carrying, lifting, bending, sitting, standing, squatting, sleeping, stairs, transfers, bed mobility, bathing, dressing, and locomotion level  PARTICIPATION LIMITATIONS: meal prep, cleaning, laundry, driving, shopping, community activity, occupation, and yard work  PERSONAL FACTORS: Fitness, Past/current experiences, Profession, Time since onset of injury/illness/exacerbation, and 3+ comorbidities: Anxiety, OA, asthma, depression, DOE, GERD, HLD, IBS, LBP, obesity, SVT and vertigo are also affecting patient's functional outcome.   REHAB POTENTIAL: Good  CLINICAL DECISION MAKING: Evolving/moderate complexity  EVALUATION COMPLEXITY: Moderate   GOALS: Goals reviewed with patient? Yes  SHORT TERM GOALS: Target date: 04/13/2024  Patient will be independent with initial HEP. Baseline: Reviewed HH PT HEP on eval Goal status: IN PROGRESS - 03/18/24 - guidance provided on how to add resistance with current HH PT HEP using 2# ankle cuff weights  2.  Patient will report at least 25% improvement in L hip pain to improve QOL. Baseline: 1-2/10 Goal  status: INITIAL  3.  Complete standardized balance testing and update LTG's as indicated Baseline: Berg and FGA vs DGI pending Goal status: INITIAL  4.  Patient will improve 5xSTS time to </= 15 seconds to demonstrate improved functional strength and transfer efficiency  Baseline: 18.97 sec Goal status: INITIAL  5.  Patient will demonstrate decreased TUG time to </= 13.5 sec to decrease risk for falls with transitional mobility Baseline: 17.69 with 4WW; 15.66 sec w/o AD Goal status: INITIAL    LONG TERM GOALS: Target date: 05/11/2024  Patient will be independent with advanced/ongoing HEP to improve outcomes and carryover.  Baseline:  Goal status: INITIAL  2.  Patient will report at least 50-75% improvement in L hip pain to improve QOL. Baseline: 1-2/10 Goal status: INITIAL  3.  Patient will demonstrate improved B LE strength to >/= 4 to 4+/5 for improved stability and ease of mobility. Baseline: Refer to above LE MMT table Goal status: INITIAL  4.  Patient will be able to ambulate 600' with or w/o LRAD and normal gait pattern without increased pain to access community.  Baseline: Currently reliant on 4WW for almost all ambulation - limited distances Goal status: IN PROGRESS - 03/18/24 - Pt using SPC for ambulation today  5.  Patient will report >/= 40/80 on LEFS (MCID =  9 pts) to demonstrate improved functional ability. Baseline:  21 / 80 = 26.3 % Goal status: INITIAL  6.  Patient will report >/= 50% average balance confidence on ABC to demonstrate at least moderate level of physical functioning and decreased risk for falls. Baseline: 520 / 1600 = 32.5 %, low level of physical functioning Goal status: INITIAL   7.  Patient will demonstrate at least 19/24 on DGI or 19/30 on FGA to decrease risk of falls. Baseline: TBA Goal status: INITIAL    PLAN:  PT FREQUENCY: 2x/week  PT DURATION: 8 weeks  PLANNED INTERVENTIONS: 97164- PT Re-evaluation, 97750- Physical Performance  Testing, 97110-Therapeutic exercises, 97530- Therapeutic activity, 97112- Neuromuscular re-education, 97535- Self Care, [id number]- Manual therapy, [phone]- Gait training, [phone]- Aquatic Therapy, [phone]- Electrical stimulation (unattended), [phone]- Ultrasound, 02966- Ionotophoresis 4mg /ml Dexamethasone, [id number] (1-2 muscles), [id number] (3+ muscles)- Dry Needling, Patient/Family education, Balance training, Stair training, Taping, Joint mobilization, Scar mobilization, DME instructions, Cryotherapy, and Moist heat  PLAN FOR NEXT SESSION: Progress core/lumbopelvic and LE strengthening; balance training; create OP PT HEP as indicated (patient currently using HH PT HEP); re-evaluate potential leg length discrepancy at ~6 weeks postop   Elijah CHRISTELLA Hidden, PT 03/18/2024, 1:50 PM

## 2024-03-28 ENCOUNTER — Encounter: Payer: Self-pay | Admitting: Rehabilitation

## 2024-03-28 ENCOUNTER — Ambulatory Visit: Admitting: Rehabilitation

## 2024-03-28 DIAGNOSIS — M25552 Pain in left hip: Secondary | ICD-10-CM

## 2024-03-28 DIAGNOSIS — M6281 Muscle weakness (generalized): Secondary | ICD-10-CM | POA: Diagnosis not present

## 2024-03-28 DIAGNOSIS — R2689 Other abnormalities of gait and mobility: Secondary | ICD-10-CM

## 2024-03-28 DIAGNOSIS — R2681 Unsteadiness on feet: Secondary | ICD-10-CM

## 2024-03-28 NOTE — Therapy (Signed)
 OUTPATIENT PHYSICAL THERAPY TREATMENT   Patient Name: Tina Hinton MRN: [id number] DOB:[birth year], 68 y.o., female Today's Date: 03/28/2024   END OF SESSION:  PT End of Session - 03/28/24 2037     Visit Number 3    Date for Recertification  05/11/24    Authorization Type Federal [employer]    PT Start Time 1515    PT Stop Time 1556    PT Time Calculation (min) 41 min    Activity Tolerance Patient tolerated treatment well    Behavior During Therapy WFL for tasks assessed/performed            Past Medical History:  Diagnosis Date   Ankle fracture    Right   Anxiety    Arthritis    hands, knees   Asthma    Bilateral knee pain    Cancer (HCC)    skin cancer   Chest pain    Chicken pox    Clavicle fracture    Left   Complication of anesthesia    Depression    Dysrhythmia    Foot fracture, left    GERD (gastroesophageal reflux disease)    History of hiatal hernia    Hyperlipidemia    IBS (irritable bowel syndrome) 01/19/2017   Joint pain    Low back pain    Osteoarthritis    Other fatigue    Palpitations    Panic attacks    Seasonal allergies    Shortness of breath on exertion    SVT (supraventricular tachycardia)    Swallowing difficulty    Uterine polyp    ablation   Past Surgical History:  Procedure Laterality Date   BIOSPY     HEAD   CESAREAN SECTION     COLONOSCOPY     ENDOMETRIAL ABLATION     MANDIBLE SURGERY     TONSILLECTOMY     TOTAL HIP ARTHROPLASTY Left 02/26/2024   Procedure: ARTHROPLASTY, HIP, TOTAL, ANTERIOR APPROACH;  Surgeon: Vernetta Lonni GRADE, MD;  Location: [facility];  Service: Orthopedics;  Laterality: Left;   TUBAL LIGATION     UPPER GASTROINTESTINAL ENDOSCOPY     Patient Active Problem List   Diagnosis Date Noted   Status post total replacement of left hip 02/26/2024   Claustrophobia 10/15/2023   Vitamin D  deficiency 06/25/2021   Class 3 severe obesity with serious comorbidity and body mass index (BMI) of 45.0 to 49.9  in adult (HCC) 03/12/2021   Prediabetes 03/12/2021   Lumbar strain, subsequent encounter 02/14/2021   Asthma    Vertigo 01/31/2020   Aortic atherosclerosis 02/15/2018   IBS (irritable bowel syndrome) - D 01/19/2017   Primary osteoarthritis of both knees 01/19/2017   Preventative health care 07/30/2015   Obesity, unspecified 07/30/2015   Anxiety 06/25/2015   Hyperlipidemia LDL goal <100 06/25/2015   Sprain of medial collateral ligament of left knee 06/25/2015   Osteoarthritis of both knees 06/25/2015   Degeneration of intervertebral disc of lumbar region 01/04/2015   Spondylolisthesis at L5-S1 level 01/04/2015   Fracture of lateral malleolus 11/14/2014   Foot, fracture, navicular 11/14/2014    PCP: Antonio Cyndee Jamee JONELLE, DO   REFERRING PROVIDER: Vernetta Lonni GRADE, MD  REFERRING DIAG: [phone] (ICD-10-CM) - Status post total replacement of left hip   THERAPY DIAG:  Muscle weakness (generalized)  Pain in left hip  Unsteadiness on feet  Other abnormalities of gait and mobility  RATIONALE FOR EVALUATION AND TREATMENT: Rehabilitation  ONSET DATE: 02/26/2024 - L THA (anterior approach)  NEXT MD VISIT: 04/07/2024   SUBJECTIVE:                                                                                                                                                                                                         SUBJECTIVE STATEMENT: Patient reports feels better.  She enters clinic without using any device.  States continues to sleep well and have progressively less pain in the L hip  EVAL: Patient is ~2.5 weeks status post L THA via anterior approach on 02/26/2024.  She initially had 2 weeks of HH PT which was completed on 03/11/24.  She continues to experience what she feels is bone pain/discomfort in the L hip but nothing like than pain she had before surgery.  Also notes numbness along the surgical incision.  Still using the 4WW full time due to fear of falling  - she states she has been using the 4WW or cane for the past 9 months due to limited standing/walking tolerance and fear of falling.  She would like to eventually try to wean from all ADs.  Sleeping has been difficult as she normally sleeps on her side and cannot comfortably right now - sleeping on her back makes her back hurt.  She thinks she may have a leg length discrepancy following the THA surgery.  PAIN: Are you having pain? Yes: NPRS scale: 0.5/10 with movement  Pain location: L lower buttock Pain description: discomfort Aggravating factors: hip exercises  Relieving factors: Tylenol  in am, heating pad at night  PERTINENT HISTORY:  Anxiety, OA, asthma, depression, DOE, GERD, HLD, IBS, LBP, obesity, SVT and vertigo.   PRECAUTIONS: Anterior hip  RED FLAGS: None  WEIGHT BEARING RESTRICTIONS: No  FALLS:  Has patient fallen in last 6 months? Yes. Number of falls 1   LIVING ENVIRONMENT: Lives with: lives with their family Lives in: House Stairs: Yes: Internal: to basement - 12 steps; on right going up, on left going up, and can reach both and External: 2+1 steps; on left going up Has following equipment at home: Single point cane, Walker - 2 wheeled, Environmental consultant - 4 wheeled, and shower chair  OCCUPATION: Works from home  PLOF: Independent and Leisure: gardening (not this year but wants to get back to that next year), [employer] water exercises   PATIENT GOALS: To walk w/o a walker or cane with a steady gait.   OBJECTIVE: (objective measures completed at initial evaluation unless otherwise dated)  DIAGNOSTIC FINDINGS:  02/26/24 - DG Pelvis IMPRESSION: 1. Left hip arthroplasty without immediate postoperative complication.  PATIENT  SURVEYS:  ABC scale: The Activities-Specific Balance Confidence (ABC) Scale 0% 10 20 30  40 50 60 70 80 90 100% No confidence<->completely confident  "How confident are you that you will not lose your balance or become unsteady when you . . .  Date tested:  03/16/2024   Walk around the house 70%  2. Walk up or down stairs 50%  3. Bend over and pick up a slipper from in front of a closet floor 30%  4. Reach for a small can off a shelf at eye level 50%  5. Stand on tip toes and reach for something above your head 30%  6. Stand on a chair and reach for something 0%  7. Sweep the floor 40%  8. Walk outside the house to a car parked in the driveway 40%  9. Get into or out of a car 40%  10. Walk across a parking lot to the mall 30%  11. Walk up or down a ramp 60%  12. Walk in a crowded mall where people rapidly walk past you 30%  13. Are bumped into by people as you walk through the mall 30%  14. Step onto or off of an escalator while you are holding onto the railing 20%  15. Step onto or off an escalator while holding onto parcels such that you cannot hold onto the railing 0%  16. Walk outside on icy sidewalks 0%  Total: #/16 520 / 1600 = 32.5 %  Level of physical functioning:  Low    LEFS  Extreme difficulty/unable (0), Quite a bit of difficulty (1), Moderate difficulty (2), Little difficulty (3), No difficulty (4) Survey date:  03/16/2024   Any of your usual work, housework or school activities 2  2. Usual hobbies, recreational or sporting activities 3  3. Getting into/out of the bath 1  4. Walking between rooms 4  5. Putting on socks/shoes 2  6. Squatting  0  7. Lifting an object, like a bag of groceries from the floor 0  8. Performing light activities around your home 2  9. Performing heavy activities around your home 0  10. Getting into/out of a car 2  11. Walking 2 blocks 0  12. Walking 1 mile 0  13. Going up/down 10 stairs (1 flight) 2  14. Standing for 1 hour 0  15.  sitting for 1 hour 2  16. Running on even ground 0  17. Running on uneven ground 0  18. Making sharp turns while running fast 0  19. Hopping  0  20. Rolling over in bed 1  Score total:   21 / 80 = 26.3 %  Functional Limitation:  Moderate    COGNITION: Overall  cognitive status: Within functional limits for tasks assessed    SENSATION: WFL Except numbness along surgical incision  POSTURE:  Apparent ~1 cm leg length discrepancy (L longer than R) with L iliac crest sitting higher  MUSCLE LENGTH: (TBA as indicated) Hamstrings:  ITB:  Piriformis:  Hip flexors:  Quads:  Heelcord:   LOWER EXTREMITY ROM:  Active ROM Right eval Left eval  Hip flexion    Hip extension    Hip abduction    Hip adduction    Hip internal rotation    Hip external rotation    Knee flexion    Knee extension    Ankle dorsiflexion    Ankle plantarflexion    Ankle inversion    Ankle eversion    (Blank rows = not  tested)  LOWER EXTREMITY MMT: (all testing in sitting on eval)  MMT Right eval Left eval  Hip flexion 4+ 3-  Hip extension 4- 3+  Hip abduction 4 3+  Hip adduction 4 3+  Hip internal rotation 4 2+  Hip external rotation 4 3+  Knee flexion 5 5  Knee extension 5 4  Ankle dorsiflexion 4- 4-  Ankle plantarflexion    Ankle inversion    Ankle eversion     (Blank rows = not tested)  FUNCTIONAL TESTS:  5 times sit to stand: 18.97 sec Timed up and go (TUG): 17.69 with 4WW; 15.66 sec w/o AD 10 meter walk test: 13.60 sec with 4WW; 16.00 sec w/o AD Berg Balance Scale: 03/18/24 - 47/56, indicating moderate (>50%) risk for falls FGA: 03/18/24 - 14/30, indicating high risk for falls  GAIT: Distance walked: clinic distances Assistive device utilized: Environmental consultant - 4 wheeled and None Level of assistance: Modified independence and SBA Gait pattern: step through pattern, lateral hip instability with L lateral lean to compensate for R hip drop/Trendelenburg     TODAY'S TREATMENT:  03/28/24 THERAPEUTIC EXERCISE: To improve endurance and ROM.  Demonstration, verbal and tactile cues throughout for technique. NuStep L4 x 6'  Foam stand:  EO x 1'   EC x 1'  toe/heel raises x 20  squats x 2/10   turning 360 deg x 2 each direction lateral step ups to foam x  10 LLE Medium swiss ball bouncing EO x 1'; EC x 1';  alternating knee ext x 10 BLE SLS at counter on LLE Seated ankle inversion and eversion w/ YTB x 2/10 BLE  03/18/2024 PHYSICAL PERFORMANCE TEST or MEASUREMENT:  Berg Balance Test   Sit to Stand Able to stand without using hands and stabilize independently    Standing Unsupported Able to stand safely 2 minutes    Sitting with Back Unsupported but Feet Supported on Floor or Stool Able to sit safely and securely 2 minutes    Stand to Sit Sits safely with minimal use of hands    Transfers Able to transfer safely, minor use of hands    Standing Unsupported with Eyes Closed Able to stand 10 seconds safely    Standing Unsupported with Feet Together Able to place feet together independently and stand 1 minute safely    From Standing, Reach Forward with Outstretched Arm Can reach forward >12 cm safely (5)    From Standing Position, Pick up Object from Floor Able to pick up shoe, needs supervision    From Standing Position, Turn to Look Behind Over each Shoulder Looks behind from both sides and weight shifts well    Turn 360 Degrees Able to turn 360 degrees safely but slowly    Standing Unsupported, Alternately Place Feet on Step/Stool Able to stand independently and complete 8 steps >20 seconds    Standing Unsupported, One Foot in Front Able to plae foot ahead of the other independently and hold 30 seconds    Standing on One Leg Tries to lift leg/unable to hold 3 seconds but remains standing independently    Total Score 47    Berg comment: 46-51 = Moderate (>50%) fall risk      Functional Gait  Assessment   Gait Level Surface Walks 20 ft, slow speed, abnormal gait pattern, evidence for imbalance or deviates 10-15 in outside of the 12 in walkway width. Requires more than 7 sec to ambulate 20 ft.    Change in Gait Speed Able to  change speed, demonstrates mild gait deviations, deviates 6-10 in outside of the 12 in walkway width, or no gait  deviations, unable to achieve a major change in velocity, or uses a change in velocity, or uses an assistive device.    Gait with Horizontal Head Turns Performs head turns smoothly with slight change in gait velocity (eg, minor disruption to smooth gait path), deviates 6-10 in outside 12 in walkway width, or uses an assistive device.    Gait with Vertical Head Turns Performs head turns with no change in gait. Deviates no more than 6 in outside 12 in walkway width.    Gait and Pivot Turn Pivot turns safely in greater than 3 sec and stops with no loss of balance, or pivot turns safely within 3 sec and stops with mild imbalance, requires small steps to catch balance.    Step Over Obstacle Is able to step over one shoe box (4.5 in total height) but must slow down and adjust steps to clear box safely. May require verbal cueing.    Gait with Narrow Base of Support Ambulates less than 4 steps heel to toe or cannot perform without assistance.    Gait with Eyes Closed Walks 20 ft, slow speed, abnormal gait pattern, evidence for imbalance, deviates 10-15 in outside 12 in walkway width. Requires more than 9 sec to ambulate 20 ft.    Ambulating Backwards Walks 20 ft, slow speed, abnormal gait pattern, evidence for imbalance, deviates 10-15 in outside 12 in walkway width.    Steps Two feet to a stair, must use rail.    Total Score 14    FGA comment: < 19 = high risk fall      THERAPEUTIC EXERCISE: To improve strength, endurance, ROM, and flexibility.  Demonstration, verbal and tactile cues throughout for technique.  NuStep - L3 x 6 min (UE/LE) Standing hip ABD x 10 bil, UE support on back of chair for balance  Standing hip extension x 10 bil, UE support on back of chair for balance  Standing hip flexion march 2# x 10 bil, UE support on back of chair for balance  Standing heel & toe raises x 20   03/16/2024  SELF CARE:  Reviewed eval findings and role of PT in addressing identified deficits as well as review  of HH PT HEP:  Supine heel slide Supine hip ABD/ADD Hookying SLR Hooklying SAQ Standing hip ABD Standing hip extension Standing heel raises - instructed pt to add alternating toe raises   PATIENT EDUCATION:  Education details: PT eval findings, anticipated POC, need for further assessment of Berg and DGI vs FGA, HEP review, and continue with current HEP  Person educated: Patient Education method: Explanation and Verbal cues Education comprehension: verbalized understanding  HOME EXERCISE PROGRAM: 03/16/24 - Reviewed HH PT HEP - will establish formal HEP next visit Supine heel slide Supine hip ABD/ADD Hookying SLR Hooklying SAQ Standing hip ABD Standing hip extension Standing heel raises - instructed pt to add alternating toe raises   ASSESSMENT:  CLINICAL IMPRESSION:  Patient is doing well overall, but is not quite steady enough to ambulate in the community without her cane.   She still has some compensated trendelenburg LLE and unsteadiness.  She is progressing quickly to goals, but still lacks full strength in the L hip.  Balance is still a deficits.  HEP printouts provided.   PT remains necessary for strength, gait, balance, pain deficits.   Continue per POC  Tina Hinton reports her pain  has been lessening this week.  She has started using her cane more than her 4WW and has been able to sleep on her R side last night.  Standardized balance testing completed with Berg score score of 47/56 indicating moderate (>50%) risk for falls and FGA score of 14/30 indicating high risk for falls.  Completed review of HH PT HEP standing exercises providing guidance on progression of exercises at home using 2# ankle cuff weights.  Will continue to progress strengthening and balance activities in upcoming visits, creating OP PT HEP as indicated.  Tina Hinton will benefit from continued skilled PT to address ongoing range of motion, coordination, strength and balance deficits to improve  mobility and activity tolerance with decreased pain interference and decreased risk for falls.   EVAL: Tina Hinton is a 68 y.o. female who was referred to physical therapy for evaluation and treatment s/p L hip THA via anterior approach on 02/26/2024.  Patient reports continued mild L hip pain since surgery, but not as intense as before surgery.  Pain is worse when she is working on her [facility] PT exercises.  Patient has deficits in L hip ROM, B LE flexibility, BLE strength, abnormal posture with potential leg length discrepancy, and altered gait pattern which are interfering with ADLs and are impacting quality of life.  On LEFS patient scored 21/80 demonstrating moderate functional limitation.  She expresses a significant concern regarding fear of falling, with ABC scale revealing only 32.5% balance confidence, indicative of a low level of physical functioning, therefore we will plan for further standardized balance testing next visit.  Tina Hinton will benefit from skilled PT to address above deficits to improve mobility and activity tolerance with decreased pain interference and decreased risk for falls.  OBJECTIVE IMPAIRMENTS: Abnormal gait, decreased activity tolerance, decreased balance, decreased coordination, decreased knowledge of condition, decreased knowledge of use of DME, decreased mobility, difficulty walking, decreased ROM, decreased strength, increased fascial restrictions, impaired perceived functional ability, increased muscle spasms, impaired flexibility, impaired sensation, improper body mechanics, postural dysfunction, and pain.   ACTIVITY LIMITATIONS: carrying, lifting, bending, sitting, standing, squatting, sleeping, stairs, transfers, bed mobility, bathing, dressing, and locomotion level  PARTICIPATION LIMITATIONS: meal prep, cleaning, laundry, driving, shopping, community activity, occupation, and yard work  PERSONAL FACTORS: Fitness, Past/current experiences, Profession, Time  since onset of injury/illness/exacerbation, and 3+ comorbidities: Anxiety, OA, asthma, depression, DOE, GERD, HLD, IBS, LBP, obesity, SVT and vertigo are also affecting patient's functional outcome.   REHAB POTENTIAL: Good  CLINICAL DECISION MAKING: Evolving/moderate complexity  EVALUATION COMPLEXITY: Moderate   GOALS: Goals reviewed with patient? Yes  SHORT TERM GOALS: Target date: 04/13/2024  Patient will be independent with initial HEP. Baseline: Reviewed HH PT HEP on eval Goal status: IN PROGRESS - 03/18/24 - guidance provided on how to add resistance with current HH PT HEP using 2# ankle cuff weights  2.  Patient will report at least 25% improvement in L hip pain to improve QOL. Baseline: 1-2/10 Goal status: MET  3.  Complete standardized balance testing and update LTG's as indicated Baseline: Berg and FGA vs DGI pending Goal status: INITIAL  4.  Patient will improve 5xSTS time to </= 15 seconds to demonstrate improved functional strength and transfer efficiency  Baseline: 18.97 sec Goal status: INITIAL  5.  Patient will demonstrate decreased TUG time to </= 13.5 sec to decrease risk for falls with transitional mobility Baseline: 17.69 with 4WW; 15.66 sec w/o AD Goal status: INITIAL    LONG TERM GOALS: Target date:  05/11/2024  Patient will be independent with advanced/ongoing HEP to improve outcomes and carryover.  Baseline:  Goal status: INITIAL  2.  Patient will report at least 50-75% improvement in L hip pain to improve QOL. Baseline: 1-2/10 Goal status: INITIAL  3.  Patient will demonstrate improved B LE strength to >/= 4 to 4+/5 for improved stability and ease of mobility. Baseline: Refer to above LE MMT table Goal status: INITIAL  4.  Patient will be able to ambulate 600' with or w/o LRAD and normal gait pattern without increased pain to access community.  Baseline: Currently reliant on 4WW for almost all ambulation - limited distances Goal status: IN  PROGRESS - 03/18/24 - Pt using SPC for ambulation today  5.  Patient will report >/= 40/80 on LEFS (MCID = 9 pts) to demonstrate improved functional ability. Baseline:  21 / 80 = 26.3 % Goal status: INITIAL  6.  Patient will report >/= 50% average balance confidence on ABC to demonstrate at least moderate level of physical functioning and decreased risk for falls. Baseline: 520 / 1600 = 32.5 %, low level of physical functioning Goal status: INITIAL   7.  Patient will demonstrate at least 19/24 on DGI or 19/30 on FGA to decrease risk of falls. Baseline: TBA Goal status: INITIAL    PLAN:  PT FREQUENCY: 2x/week  PT DURATION: 8 weeks  PLANNED INTERVENTIONS: 97164- PT Re-evaluation, 97750- Physical Performance Testing, 97110-Therapeutic exercises, 97530- Therapeutic activity, 97112- Neuromuscular re-education, 97535- Self Care, [id number]- Manual therapy, [phone]- Gait training, [phone]- Aquatic Therapy, [phone]- Electrical stimulation (unattended), [phone]- Ultrasound, [id number]- Ionotophoresis 4mg /ml Dexamethasone, 79439 (1-2 muscles), 20561 (3+ muscles)- Dry Needling, Patient/Family education, Balance training, Stair training, Taping, Joint mobilization, Scar mobilization, DME instructions, Cryotherapy, and Moist heat  PLAN FOR NEXT SESSION: continue to add balance activities; re-evaluate potential leg length discrepancy at ~6 weeks postop   Tina Hinton, PT 03/28/2024, 8:39 PM

## 2024-03-31 ENCOUNTER — Encounter: Payer: Self-pay | Admitting: Physical Therapy

## 2024-03-31 ENCOUNTER — Ambulatory Visit: Admitting: Physical Therapy

## 2024-03-31 DIAGNOSIS — M6281 Muscle weakness (generalized): Secondary | ICD-10-CM | POA: Diagnosis not present

## 2024-03-31 DIAGNOSIS — R2681 Unsteadiness on feet: Secondary | ICD-10-CM

## 2024-03-31 DIAGNOSIS — M25552 Pain in left hip: Secondary | ICD-10-CM

## 2024-03-31 DIAGNOSIS — R2689 Other abnormalities of gait and mobility: Secondary | ICD-10-CM

## 2024-03-31 NOTE — Therapy (Signed)
 OUTPATIENT PHYSICAL THERAPY TREATMENT   Patient Name: Tina Hinton MRN: [id number] DOB:1955-07-24, 68 y.o., female Today's Date: 03/31/2024   END OF SESSION:  PT End of Session - 03/31/24 1615     Visit Number 4    Date for Recertification  05/11/24    Authorization Type Federal [employer]    PT Start Time 1615    PT Stop Time 1701    PT Time Calculation (min) 46 min    Activity Tolerance Patient tolerated treatment well    Behavior During Therapy WFL for tasks assessed/performed             Past Medical History:  Diagnosis Date   Ankle fracture    Right   Anxiety    Arthritis    hands, knees   Asthma    Bilateral knee pain    Cancer (HCC)    skin cancer   Chest pain    Chicken pox    Clavicle fracture    Left   Complication of anesthesia    Depression    Dysrhythmia    Foot fracture, left    GERD (gastroesophageal reflux disease)    History of hiatal hernia    Hyperlipidemia    IBS (irritable bowel syndrome) 01/19/2017   Joint pain    Low back pain    Osteoarthritis    Other fatigue    Palpitations    Panic attacks    Seasonal allergies    Shortness of breath on exertion    SVT (supraventricular tachycardia)    Swallowing difficulty    Uterine polyp    ablation   Past Surgical History:  Procedure Laterality Date   BIOSPY     HEAD   CESAREAN SECTION     COLONOSCOPY     ENDOMETRIAL ABLATION     MANDIBLE SURGERY     TONSILLECTOMY     TOTAL HIP ARTHROPLASTY Left 02/26/2024   Procedure: ARTHROPLASTY, HIP, TOTAL, ANTERIOR APPROACH;  Surgeon: Vernetta Lonni GRADE, MD;  Location: [facility];  Service: Orthopedics;  Laterality: Left;   TUBAL LIGATION     UPPER GASTROINTESTINAL ENDOSCOPY     Patient Active Problem List   Diagnosis Date Noted   Status post total replacement of left hip 02/26/2024   Claustrophobia 10/15/2023   Vitamin D  deficiency 06/25/2021   Class 3 severe obesity with serious comorbidity and body mass index (BMI) of 45.0 to  49.9 in adult (HCC) 03/12/2021   Prediabetes 03/12/2021   Lumbar strain, subsequent encounter 02/14/2021   Asthma    Vertigo 01/31/2020   Aortic atherosclerosis 02/15/2018   IBS (irritable bowel syndrome) - D 01/19/2017   Primary osteoarthritis of both knees 01/19/2017   Preventative health care 07/30/2015   Obesity, unspecified 07/30/2015   Anxiety 06/25/2015   Hyperlipidemia LDL goal <100 06/25/2015   Sprain of medial collateral ligament of left knee 06/25/2015   Osteoarthritis of both knees 06/25/2015   Degeneration of intervertebral disc of lumbar region 01/04/2015   Spondylolisthesis at L5-S1 level 01/04/2015   Fracture of lateral malleolus 11/14/2014   Foot, fracture, navicular 11/14/2014    PCP: Antonio Cyndee Jamee JONELLE, DO   REFERRING PROVIDER: Vernetta Lonni GRADE, MD  REFERRING DIAG: [phone] (ICD-10-CM) - Status post total replacement of left hip   THERAPY DIAG:  Muscle weakness (generalized)  Pain in left hip  Unsteadiness on feet  Other abnormalities of gait and mobility  RATIONALE FOR EVALUATION AND TREATMENT: Rehabilitation  ONSET DATE: 02/26/2024 - L THA (anterior  approach)  NEXT MD VISIT: 04/07/2024   SUBJECTIVE:                                                                                                                                                                                                         SUBJECTIVE STATEMENT: Patient reports she went back to work this week. Didn't sleep well last night - maybe due to stress. Otherwise doing well - notes able to get her sock on now.  Patient hopes to be ready to go to the pool by the time she sees Dr. Vernetta next week.  EVAL: Patient is ~2.5 weeks status post L THA via anterior approach on 02/26/2024.  She initially had 2 weeks of HH PT which was completed on 03/11/24.  She continues to experience what she feels is bone pain/discomfort in the L hip but nothing like than pain she had before surgery.   Also notes numbness along the surgical incision.  Still using the 4WW full time due to fear of falling - she states she has been using the 4WW or cane for the past 9 months due to limited standing/walking tolerance and fear of falling.  She would like to eventually try to wean from all ADs.  Sleeping has been difficult as she normally sleeps on her side and cannot comfortably right now - sleeping on her back makes her back hurt.  She thinks she may have a leg length discrepancy following the THA surgery.  PAIN: Are you having pain? Yes: NPRS scale: 2/10   Pain location: L hip/buttock  Pain description: sore  Aggravating factors: not sleeping well - tossing and turning?  Relieving factors: Tylenol  in am, heating pad at night  PERTINENT HISTORY:  Anxiety, OA, asthma, depression, DOE, GERD, HLD, IBS, LBP, obesity, SVT and vertigo.   PRECAUTIONS: Anterior hip  RED FLAGS: None  WEIGHT BEARING RESTRICTIONS: No  FALLS:  Has patient fallen in last 6 months? Yes. Number of falls 1   LIVING ENVIRONMENT: Lives with: lives with their family Lives in: House Stairs: Yes: Internal: to basement - 12 steps; on right going up, on left going up, and can reach both and External: 2+1 steps; on left going up Has following equipment at home: Single point cane, Walker - 2 wheeled, Environmental consultant - 4 wheeled, and shower chair  OCCUPATION: Works from home  PLOF: Independent and Leisure: gardening (not this year but wants to get back to that next year), [employer] water exercises   PATIENT GOALS: To walk w/o a walker or cane with a steady gait.  OBJECTIVE: (objective measures completed at initial evaluation unless otherwise dated)  DIAGNOSTIC FINDINGS:  02/26/24 - DG Pelvis IMPRESSION: 1. Left hip arthroplasty without immediate postoperative complication.  PATIENT SURVEYS:  ABC scale: The Activities-Specific Balance Confidence (ABC) Scale 0% 10 20 30  40 50 60 70 80 90 100% No confidence<->completely confident   "How confident are you that you will not lose your balance or become unsteady when you . . .  Date tested: 03/16/2024   Walk around the house 70%  2. Walk up or down stairs 50%  3. Bend over and pick up a slipper from in front of a closet floor 30%  4. Reach for a small can off a shelf at eye level 50%  5. Stand on tip toes and reach for something above your head 30%  6. Stand on a chair and reach for something 0%  7. Sweep the floor 40%  8. Walk outside the house to a car parked in the driveway 40%  9. Get into or out of a car 40%  10. Walk across a parking lot to the mall 30%  11. Walk up or down a ramp 60%  12. Walk in a crowded mall where people rapidly walk past you 30%  13. Are bumped into by people as you walk through the mall 30%  14. Step onto or off of an escalator while you are holding onto the railing 20%  15. Step onto or off an escalator while holding onto parcels such that you cannot hold onto the railing 0%  16. Walk outside on icy sidewalks 0%  Total: #/16 520 / 1600 = 32.5 %  Level of physical functioning:  Low    LEFS  Extreme difficulty/unable (0), Quite a bit of difficulty (1), Moderate difficulty (2), Little difficulty (3), No difficulty (4) Survey date:  03/16/2024   Any of your usual work, housework or school activities 2  2. Usual hobbies, recreational or sporting activities 3  3. Getting into/out of the bath 1  4. Walking between rooms 4  5. Putting on socks/shoes 2  6. Squatting  0  7. Lifting an object, like a bag of groceries from the floor 0  8. Performing light activities around your home 2  9. Performing heavy activities around your home 0  10. Getting into/out of a car 2  11. Walking 2 blocks 0  12. Walking 1 mile 0  13. Going up/down 10 stairs (1 flight) 2  14. Standing for 1 hour 0  15.  sitting for 1 hour 2  16. Running on even ground 0  17. Running on uneven ground 0  18. Making sharp turns while running fast 0  19. Hopping  0  20.  Rolling over in bed 1  Score total:   21 / 80 = 26.3 %  Functional Limitation:  Moderate    COGNITION: Overall cognitive status: Within functional limits for tasks assessed    SENSATION: WFL Except numbness along surgical incision  POSTURE:  Apparent ~1 cm leg length discrepancy (L longer than R) with L iliac crest sitting higher  MUSCLE LENGTH: (TBA as indicated) Hamstrings:  ITB:  Piriformis:  Hip flexors:  Quads:  Heelcord:   LOWER EXTREMITY ROM:  Active ROM Right eval Left eval  Hip flexion    Hip extension    Hip abduction    Hip adduction    Hip internal rotation    Hip external rotation    Knee flexion  Knee extension    Ankle dorsiflexion    Ankle plantarflexion    Ankle inversion    Ankle eversion    (Blank rows = not tested)  LOWER EXTREMITY MMT: (all testing in sitting on eval)  MMT Right eval Left eval  Hip flexion 4+ 3-  Hip extension 4- 3+  Hip abduction 4 3+  Hip adduction 4 3+  Hip internal rotation 4 2+  Hip external rotation 4 3+  Knee flexion 5 5  Knee extension 5 4  Ankle dorsiflexion 4- 4-  Ankle plantarflexion    Ankle inversion    Ankle eversion     (Blank rows = not tested)  FUNCTIONAL TESTS:  5 times sit to stand: 18.97 sec Timed up and go (TUG): 17.69 with 4WW; 15.66 sec w/o AD 10 meter walk test: 13.60 sec with 4WW; 16.00 sec w/o AD Berg Balance Scale: 03/18/24 - 47/56, indicating moderate (>50%) risk for falls FGA: 03/18/24 - 14/30, indicating high risk for falls  GAIT: Distance walked: clinic distances Assistive device utilized: Environmental consultant - 4 wheeled and None Level of assistance: Modified independence and SBA Gait pattern: step through pattern, lateral hip instability with L lateral lean to compensate for R hip drop/Trendelenburg     TODAY'S TREATMENT:   03/31/2024  THERAPEUTIC EXERCISE: To improve strength and endurance.  Demonstration, verbal and tactile cues throughout for technique.  Rec Bike - L2 x 7' SLS on  2 step + opp LE hip hike x 5 bil  NEUROMUSCULAR RE-EDUCATION: To improve balance, coordination, kinesthesia, posture, proprioception, and reduce fall risk. L SLS + 5-way step/toe tap to colored dots x 5 cycles L SLS + 5-way slides to colored dots x 5 cycles L/R SLS + opp LE RTB 4-way SLR x 10, intermittent UE support on back of chair B side-stepping on balance beam x 4 passes - occasional   03/28/24 THERAPEUTIC EXERCISE: To improve endurance and ROM.  Demonstration, verbal and tactile cues throughout for technique. NuStep L4 x 6'  Foam stand:  EO x 1'   EC x 1'  toe/heel raises x 20  squats x 2/10   turning 360 deg x 2 each direction lateral step ups to foam x 10 LLE Medium swiss ball bouncing EO x 1'; EC x 1';  alternating knee ext x 10 BLE SLS at counter on LLE Seated ankle inversion and eversion w/ YTB x 2/10 BLE   03/18/2024 PHYSICAL PERFORMANCE TEST or MEASUREMENT:  Berg Balance Test   Sit to Stand Able to stand without using hands and stabilize independently    Standing Unsupported Able to stand safely 2 minutes    Sitting with Back Unsupported but Feet Supported on Floor or Stool Able to sit safely and securely 2 minutes    Stand to Sit Sits safely with minimal use of hands    Transfers Able to transfer safely, minor use of hands    Standing Unsupported with Eyes Closed Able to stand 10 seconds safely    Standing Unsupported with Feet Together Able to place feet together independently and stand 1 minute safely    From Standing, Reach Forward with Outstretched Arm Can reach forward >12 cm safely (5)    From Standing Position, Pick up Object from Floor Able to pick up shoe, needs supervision    From Standing Position, Turn to Look Behind Over each Shoulder Looks behind from both sides and weight shifts well    Turn 360 Degrees Able to turn 360 degrees safely  but slowly    Standing Unsupported, Alternately Place Feet on Step/Stool Able to stand independently and complete  8 steps >20 seconds    Standing Unsupported, One Foot in Front Able to place foot ahead of the other independently and hold 30 seconds    Standing on One Leg Tries to lift leg/unable to hold 3 seconds but remains standing independently    Total Score 47    Berg comment: 46-51 = Moderate (>50%) fall risk      Functional Gait  Assessment   Gait Level Surface Walks 20 ft, slow speed, abnormal gait pattern, evidence for imbalance or deviates 10-15 in outside of the 12 in walkway width. Requires more than 7 sec to ambulate 20 ft.    Change in Gait Speed Able to change speed, demonstrates mild gait deviations, deviates 6-10 in outside of the 12 in walkway width, or no gait deviations, unable to achieve a major change in velocity, or uses a change in velocity, or uses an assistive device.    Gait with Horizontal Head Turns Performs head turns smoothly with slight change in gait velocity (eg, minor disruption to smooth gait path), deviates 6-10 in outside 12 in walkway width, or uses an assistive device.    Gait with Vertical Head Turns Performs head turns with no change in gait. Deviates no more than 6 in outside 12 in walkway width.    Gait and Pivot Turn Pivot turns safely in greater than 3 sec and stops with no loss of balance, or pivot turns safely within 3 sec and stops with mild imbalance, requires small steps to catch balance.    Step Over Obstacle Is able to step over one shoe box (4.5 in total height) but must slow down and adjust steps to clear box safely. May require verbal cueing.    Gait with Narrow Base of Support Ambulates less than 4 steps heel to toe or cannot perform without assistance.    Gait with Eyes Closed Walks 20 ft, slow speed, abnormal gait pattern, evidence for imbalance, deviates 10-15 in outside 12 in walkway width. Requires more than 9 sec to ambulate 20 ft.    Ambulating Backwards Walks 20 ft, slow speed, abnormal gait pattern, evidence for imbalance, deviates 10-15 in outside  12 in walkway width.    Steps Two feet to a stair, must use rail.    Total Score 14    FGA comment: < 19 = high risk fall      THERAPEUTIC EXERCISE: To improve strength, endurance, ROM, and flexibility.  Demonstration, verbal and tactile cues throughout for technique.  NuStep - L3 x 6 min (UE/LE) Standing hip ABD x 10 bil, UE support on back of chair for balance  Standing hip extension x 10 bil, UE support on back of chair for balance  Standing hip flexion march 2# x 10 bil, UE support on back of chair for balance  Standing heel & toe raises x 20   03/16/2024  SELF CARE:  Reviewed eval findings and role of PT in addressing identified deficits as well as review of HH PT HEP:  Supine heel slide Supine hip ABD/ADD Hookying SLR Hooklying SAQ Standing hip ABD Standing hip extension Standing heel raises - instructed pt to add alternating toe raises   PATIENT EDUCATION:  Education details: HEP progression - standing (SLS) RTB 4-way SLR + hip hikes  Person educated: Patient Education method: Explanation, Demonstration, Verbal cues, and Handouts Education comprehension: verbalized understanding, returned demonstration, verbal cues  required, and needs further education  HOME EXERCISE PROGRAM: Access Code: [email or web address] URL: [email or web address] Date: 03/31/2024 Prepared by: Tina Hinton  Exercises - Standing Hip Flexion with Anchored Resistance and Chair Support  - 1 x daily - 3 x weekly - 2 sets - 10 reps - 3 sec hold - Standing Hip Adduction with Anchored Resistance  - 1 x daily - 3 x weekly - 2 sets - 10 reps - 3 sec hold - Standing Hip Extension with Anchored Resistance  - 1 x daily - 3 x weekly - 2 sets - 10 reps - 3 sec hold - Standing Hip Abduction with Anchored Resistance  - 1 x daily - 3 x weekly - 2 sets - 10 reps - 3 sec hold - Hip Hiking on Step  - 1 x daily - 3 x weekly - 2 sets - 10 reps - 3 sec hold  HH PT HEP + 2# ankle cuff weights Supine heel  slide Supine hip ABD/ADD Hookying SLR Hooklying SAQ Standing hip ABD Standing hip extension Standing heel raises - instructed pt to add alternating toe raises   ASSESSMENT:  CLINICAL IMPRESSION:  Tina Hinton reports she feels she is doing a little better everyday, relying less on the RW and the cane but still keeps the cane available for safety.  Increased SLS activities to increase glute medius activation to work on leveling pelvis and reducing [place] as well as improving balance, but continued difficulty observed esp with hip hiking. HEP updated to reflect exercise progression.  Tina Hinton will benefit from continued skilled PT to address ongoing ROM, coordination, strength and balance deficits to improve mobility and activity tolerance with decreased pain interference and decreased risk for falls.    EVAL: Tina Hinton is a 68 y.o. female who was referred to physical therapy for evaluation and treatment s/p L hip THA via anterior approach on 02/26/2024.  Patient reports continued mild L hip pain since surgery, but not as intense as before surgery.  Pain is worse when she is working on her [facility] PT exercises.  Patient has deficits in L hip ROM, B LE flexibility, BLE strength, abnormal posture with potential leg length discrepancy, and altered gait pattern which are interfering with ADLs and are impacting quality of life.  On LEFS patient scored 21/80 demonstrating moderate functional limitation.  She expresses a significant concern regarding fear of falling, with ABC scale revealing only 32.5% balance confidence, indicative of a low level of physical functioning, therefore we will plan for further standardized balance testing next visit.  Tina Hinton will benefit from skilled PT to address above deficits to improve mobility and activity tolerance with decreased pain interference and decreased risk for falls.  OBJECTIVE IMPAIRMENTS: Abnormal gait, decreased activity tolerance,  decreased balance, decreased coordination, decreased knowledge of condition, decreased knowledge of use of DME, decreased mobility, difficulty walking, decreased ROM, decreased strength, increased fascial restrictions, impaired perceived functional ability, increased muscle spasms, impaired flexibility, impaired sensation, improper body mechanics, postural dysfunction, and pain.   ACTIVITY LIMITATIONS: carrying, lifting, bending, sitting, standing, squatting, sleeping, stairs, transfers, bed mobility, bathing, dressing, and locomotion level  PARTICIPATION LIMITATIONS: meal prep, cleaning, laundry, driving, shopping, community activity, occupation, and yard work  PERSONAL FACTORS: Fitness, Past/current experiences, Profession, Time since onset of injury/illness/exacerbation, and 3+ comorbidities: Anxiety, OA, asthma, depression, DOE, GERD, HLD, IBS, LBP, obesity, SVT and vertigo are also affecting patient's functional outcome.   REHAB POTENTIAL: Good  CLINICAL DECISION MAKING: Evolving/moderate complexity  EVALUATION COMPLEXITY: Moderate  GOALS: Goals reviewed with patient? Yes  SHORT TERM GOALS: Target date: 04/13/2024  Patient will be independent with initial HEP. Baseline: Reviewed HH PT HEP on eval Goal status: IN PROGRESS - 03/18/24 - guidance provided on how to add resistance with current HH PT HEP using 2# ankle cuff weights  2.  Patient will report at least 25% improvement in L hip pain to improve QOL. Baseline: 1-2/10 Goal status: MET  3.  Complete standardized balance testing and update LTG's as indicated Baseline: Berg and FGA vs DGI pending Goal status: MET - 03/18/24  4.  Patient will improve 5xSTS time to </= 15 seconds to demonstrate improved functional strength and transfer efficiency  Baseline: 18.97 sec Goal status: INITIAL  5.  Patient will demonstrate decreased TUG time to </= 13.5 sec to decrease risk for falls with transitional mobility Baseline: 17.69 with  4WW; 15.66 sec w/o AD Goal status: INITIAL    LONG TERM GOALS: Target date: 05/11/2024  Patient will be independent with advanced/ongoing HEP to improve outcomes and carryover.  Baseline:  Goal status: INITIAL  2.  Patient will report at least 50-75% improvement in L hip pain to improve QOL. Baseline: 1-2/10 Goal status: INITIAL  3.  Patient will demonstrate improved B LE strength to >/= 4 to 4+/5 for improved stability and ease of mobility. Baseline: Refer to above LE MMT table Goal status: INITIAL  4.  Patient will be able to ambulate 600' with or w/o LRAD and normal gait pattern without increased pain to access community.  Baseline: Currently reliant on 4WW for almost all ambulation - limited distances Goal status: IN PROGRESS - 03/18/24 - Pt using SPC for ambulation today  5.  Patient will report >/= 40/80 on LEFS (MCID = 9 pts) to demonstrate improved functional ability. Baseline:  21 / 80 = 26.3 % Goal status: INITIAL  6.  Patient will report >/= 50% average balance confidence on ABC to demonstrate at least moderate level of physical functioning and decreased risk for falls. Baseline: 520 / 1600 = 32.5 %, low level of physical functioning Goal status: INITIAL   7.  Patient will demonstrate at least 19/24 on DGI or 19/30 on FGA to decrease risk of falls. Baseline: TBA Goal status: INITIAL    PLAN:  PT FREQUENCY: 2x/week  PT DURATION: 8 weeks  PLANNED INTERVENTIONS: 97164- PT Re-evaluation, 97750- Physical Performance Testing, 97110-Therapeutic exercises, 97530- Therapeutic activity, 97112- Neuromuscular re-education, 97535- Self Care, [id number]- Manual therapy, [phone]- Gait training, [phone]- Aquatic Therapy, [phone]- Electrical stimulation (unattended), [phone]- Ultrasound, 02966- Ionotophoresis 4mg /ml Dexamethasone, 79439 (1-2 muscles), [id number] (3+ muscles)- Dry Needling, Patient/Family education, Balance training, Stair training, Taping, Joint mobilization, Scar mobilization, DME  instructions, Cryotherapy, and Moist heat  PLAN FOR NEXT SESSION: Initiate goal reassessment in prep for MD PN for appt 04/07/24; continue to add balance activities; re-evaluate potential leg length discrepancy at ~6 weeks postop   Tina CHRISTELLA Hinton, PT 03/31/2024, 5:33 PM

## 2024-04-04 ENCOUNTER — Ambulatory Visit: Admitting: Physical Therapy

## 2024-04-04 ENCOUNTER — Encounter: Payer: Self-pay | Admitting: Physical Therapy

## 2024-04-04 DIAGNOSIS — M6281 Muscle weakness (generalized): Secondary | ICD-10-CM | POA: Diagnosis not present

## 2024-04-04 DIAGNOSIS — R2689 Other abnormalities of gait and mobility: Secondary | ICD-10-CM

## 2024-04-04 DIAGNOSIS — M25552 Pain in left hip: Secondary | ICD-10-CM

## 2024-04-04 DIAGNOSIS — R2681 Unsteadiness on feet: Secondary | ICD-10-CM

## 2024-04-04 NOTE — Therapy (Signed)
 OUTPATIENT PHYSICAL THERAPY TREATMENT   Patient Name: Tina Hinton MRN: [id number] DOB:[birth year], 68 y.o., female Today's Date: 04/04/2024   END OF SESSION:  PT End of Session - 04/04/24 0805     Visit Number 5    Date for Recertification  05/11/24    Authorization Type Federal [employer]    PT Start Time 0805   Pt arrived late   PT Stop Time 0846    PT Time Calculation (min) 41 min    Activity Tolerance Patient tolerated treatment well    Behavior During Therapy [facility] for tasks assessed/performed              Past Medical History:  Diagnosis Date   Ankle fracture    Right   Anxiety    Arthritis    hands, knees   Asthma    Bilateral knee pain    Cancer (HCC)    skin cancer   Chest pain    Chicken pox    Clavicle fracture    Left   Complication of anesthesia    Depression    Dysrhythmia    Foot fracture, left    GERD (gastroesophageal reflux disease)    History of hiatal hernia    Hyperlipidemia    IBS (irritable bowel syndrome) 01/19/2017   Joint pain    Low back pain    Osteoarthritis    Other fatigue    Palpitations    Panic attacks    Seasonal allergies    Shortness of breath on exertion    SVT (supraventricular tachycardia)    Swallowing difficulty    Uterine polyp    ablation   Past Surgical History:  Procedure Laterality Date   BIOSPY     HEAD   CESAREAN SECTION     COLONOSCOPY     ENDOMETRIAL ABLATION     MANDIBLE SURGERY     TONSILLECTOMY     TOTAL HIP ARTHROPLASTY Left 02/26/2024   Procedure: ARTHROPLASTY, HIP, TOTAL, ANTERIOR APPROACH;  Surgeon: Vernetta Lonni GRADE, MD;  Location: [facility];  Service: Orthopedics;  Laterality: Left;   TUBAL LIGATION     UPPER GASTROINTESTINAL ENDOSCOPY     Patient Active Problem List   Diagnosis Date Noted   Status post total replacement of left hip 02/26/2024   Claustrophobia 10/15/2023   Vitamin D  deficiency 06/25/2021   Class 3 severe obesity with serious comorbidity and body mass index  (BMI) of 45.0 to 49.9 in adult (HCC) 03/12/2021   Prediabetes 03/12/2021   Lumbar strain, subsequent encounter 02/14/2021   Asthma    Vertigo 01/31/2020   Aortic atherosclerosis 02/15/2018   IBS (irritable bowel syndrome) - D 01/19/2017   Primary osteoarthritis of both knees 01/19/2017   Preventative health care 07/30/2015   Obesity, unspecified 07/30/2015   Anxiety 06/25/2015   Hyperlipidemia LDL goal <100 06/25/2015   Sprain of medial collateral ligament of left knee 06/25/2015   Osteoarthritis of both knees 06/25/2015   Degeneration of intervertebral disc of lumbar region 01/04/2015   Spondylolisthesis at L5-S1 level 01/04/2015   Fracture of lateral malleolus 11/14/2014   Foot, fracture, navicular 11/14/2014    PCP: Antonio Cyndee Jamee JONELLE, DO   REFERRING PROVIDER: Vernetta Lonni GRADE, MD  REFERRING DIAG: [phone] (ICD-10-CM) - Status post total replacement of left hip   THERAPY DIAG:  Muscle weakness (generalized)  Pain in left hip  Unsteadiness on feet  Other abnormalities of gait and mobility  RATIONALE FOR EVALUATION AND TREATMENT: Rehabilitation  ONSET DATE:  02/26/2024 - L THA (anterior approach)  NEXT MD VISIT: 04/07/2024   SUBJECTIVE:                                                                                                                                                                                                         SUBJECTIVE STATEMENT: Patient reports she is a little sore this morning this morning - she thinks she overdid it this weekend.  She is also proud of herself in breaking the 200# mark in her weight loss goals.  EVAL: Patient is ~2.5 weeks status post L THA via anterior approach on 02/26/2024.  She initially had 2 weeks of HH PT which was completed on 03/11/24.  She continues to experience what she feels is bone pain/discomfort in the L hip but nothing like than pain she had before surgery.  Also notes numbness along the surgical  incision.  Still using the 4WW full time due to fear of falling - she states she has been using the 4WW or cane for the past 9 months due to limited standing/walking tolerance and fear of falling.  She would like to eventually try to wean from all ADs.  Sleeping has been difficult as she normally sleeps on her side and cannot comfortably right now - sleeping on her back makes her back hurt.  She thinks she may have a leg length discrepancy following the THA surgery.  PAIN: Are you having pain? Yes: NPRS scale: 2/10   Pain location: L hip/buttock  Pain description: sore  Aggravating factors: not sleeping well - tossing and turning?  Relieving factors: Tylenol  in am, heating pad at night  PERTINENT HISTORY:  Anxiety, OA, asthma, depression, DOE, GERD, HLD, IBS, LBP, obesity, SVT and vertigo.   PRECAUTIONS: Anterior hip  RED FLAGS: None  WEIGHT BEARING RESTRICTIONS: No  FALLS:  Has patient fallen in last 6 months? Yes. Number of falls 1   LIVING ENVIRONMENT: Lives with: lives with their family Lives in: House Stairs: Yes: Internal: to basement - 12 steps; on right going up, on left going up, and can reach both and External: 2+1 steps; on left going up Has following equipment at home: Single point cane, Walker - 2 wheeled, Environmental Consultant - 4 wheeled, and shower chair  OCCUPATION: Works from home  PLOF: Independent and Leisure: gardening (not this year but wants to get back to that next year), [employer] water exercises   PATIENT GOALS: To walk w/o a walker or cane with a steady gait.   OBJECTIVE: (objective measures completed at initial evaluation unless  otherwise dated)  DIAGNOSTIC FINDINGS:  02/26/24 - DG Pelvis IMPRESSION: 1. Left hip arthroplasty without immediate postoperative complication.  PATIENT SURVEYS:  ABC scale: The Activities-Specific Balance Confidence (ABC) Scale 0% 10 20 30  40 50 60 70 80 90 100% No confidence<->completely confident  "How confident are you that you will  not lose your balance or become unsteady when you . . .  Date tested: 03/16/2024   Walk around the house 70%  2. Walk up or down stairs 50%  3. Bend over and pick up a slipper from in front of a closet floor 30%  4. Reach for a small can off a shelf at eye level 50%  5. Stand on tip toes and reach for something above your head 30%  6. Stand on a chair and reach for something 0%  7. Sweep the floor 40%  8. Walk outside the house to a car parked in the driveway 40%  9. Get into or out of a car 40%  10. Walk across a parking lot to the mall 30%  11. Walk up or down a ramp 60%  12. Walk in a crowded mall where people rapidly walk past you 30%  13. Are bumped into by people as you walk through the mall 30%  14. Step onto or off of an escalator while you are holding onto the railing 20%  15. Step onto or off an escalator while holding onto parcels such that you cannot hold onto the railing 0%  16. Walk outside on icy sidewalks 0%  Total: #/16 520 / 1600 = 32.5 %  Level of physical functioning:  Low    LEFS  Extreme difficulty/unable (0), Quite a bit of difficulty (1), Moderate difficulty (2), Little difficulty (3), No difficulty (4) Survey date:  03/16/2024   Any of your usual work, housework or school activities 2  2. Usual hobbies, recreational or sporting activities 3  3. Getting into/out of the bath 1  4. Walking between rooms 4  5. Putting on socks/shoes 2  6. Squatting  0  7. Lifting an object, like a bag of groceries from the floor 0  8. Performing light activities around your home 2  9. Performing heavy activities around your home 0  10. Getting into/out of a car 2  11. Walking 2 blocks 0  12. Walking 1 mile 0  13. Going up/down 10 stairs (1 flight) 2  14. Standing for 1 hour 0  15.  sitting for 1 hour 2  16. Running on even ground 0  17. Running on uneven ground 0  18. Making sharp turns while running fast 0  19. Hopping  0  20. Rolling over in bed 1  Score total:   21 /  80 = 26.3 %  Functional Limitation:  Moderate    COGNITION: Overall cognitive status: Within functional limits for tasks assessed    SENSATION: WFL Except numbness along surgical incision  POSTURE:  Apparent ~1 cm leg length discrepancy (L longer than R) with L iliac crest sitting higher  MUSCLE LENGTH: (TBA as indicated) Hamstrings:  ITB:  Piriformis:  Hip flexors:  Quads:  Heelcord:   LOWER EXTREMITY ROM:  Active ROM Right eval Left eval  Hip flexion    Hip extension    Hip abduction    Hip adduction    Hip internal rotation    Hip external rotation    Knee flexion    Knee extension    Ankle dorsiflexion  Ankle plantarflexion    Ankle inversion    Ankle eversion    (Blank rows = not tested)  LOWER EXTREMITY MMT: (all testing in sitting on eval)  MMT Right eval Left eval  Hip flexion 4+ 3-  Hip extension 4- 3+  Hip abduction 4 3+  Hip adduction 4 3+  Hip internal rotation 4 2+  Hip external rotation 4 3+  Knee flexion 5 5  Knee extension 5 4  Ankle dorsiflexion 4- 4-  Ankle plantarflexion    Ankle inversion    Ankle eversion     (Blank rows = not tested)  FUNCTIONAL TESTS:  5 times sit to stand: 18.97 sec Timed up and go (TUG): 17.69 with 4WW; 15.66 sec w/o AD 10 meter walk test: 13.60 sec with 4WW; 16.00 sec w/o AD Berg Balance Scale: 03/18/24 - 47/56, indicating moderate (>50%) risk for falls FGA: 03/18/24 - 14/30, indicating high risk for falls  GAIT: Distance walked: clinic distances Assistive device utilized: Environmental Consultant - 4 wheeled and None Level of assistance: Modified independence and SBA Gait pattern: step through pattern, lateral hip instability with L lateral lean to compensate for R hip drop/Trendelenburg     TODAY'S TREATMENT:   04/04/2024  THERAPEUTIC EXERCISE: To improve strength and endurance.    Rec Bike - L2 x 7'  THERAPEUTIC ACTIVITIES: To improve functional performance.  Demonstration, verbal and tactile cues throughout for  technique. 5xSTS = 12.53 sec w/o UE assist TUG = 9.62 sec w/o AD TRX squat 2 x 10  NEUROMUSCULAR RE-EDUCATION: To improve balance, coordination, kinesthesia, proprioception, and reduce fall risk. Fitter hip ABD (1 black, 1 blue) 2 x 10 bil Fitter hip extension (1 black, 1 blue) 2 x 10 bil, intermittent UE support on back of chair L/R SLS + 5-way step/toe tap to colored dots x 1 cycle,  L/R SLS + 6-way (fwd cross-over) step/toe tap to colored dots x 2 cycles L/R SLS + 5-way slides to colored dots x 5 cycles   03/31/2024  THERAPEUTIC EXERCISE: To improve strength and endurance.  Demonstration, verbal and tactile cues throughout for technique.  Rec Bike - L2 x 7' SLS on 2 step + opp LE hip hike x 5 bil  NEUROMUSCULAR RE-EDUCATION: To improve balance, coordination, kinesthesia, posture, proprioception, and reduce fall risk. L SLS + 5-way step/toe tap to colored dots x 5 cycles L SLS + 5-way slides to colored dots x 5 cycles L/R SLS + opp LE RTB 4-way SLR x 10, intermittent UE support on back of chair B side-stepping on balance beam x 4 passes - occasional   03/28/24 THERAPEUTIC EXERCISE: To improve endurance and ROM.  Demonstration, verbal and tactile cues throughout for technique. NuStep L4 x 6'  Foam stand:  EO x 1'   EC x 1'  toe/heel raises x 20  squats x 2/10   turning 360 deg x 2 each direction lateral step ups to foam x 10 LLE Medium swiss ball bouncing EO x 1'; EC x 1';  alternating knee ext x 10 BLE SLS at counter on LLE Seated ankle inversion and eversion w/ YTB x 2/10 BLE   03/18/2024 PHYSICAL PERFORMANCE TEST or MEASUREMENT:  Berg Balance Test   Sit to Stand Able to stand without using hands and stabilize independently    Standing Unsupported Able to stand safely 2 minutes    Sitting with Back Unsupported but Feet Supported on Floor or Stool Able to sit safely and securely 2 minutes  Stand to Sit Sits safely with minimal use of hands    Transfers Able to  transfer safely, minor use of hands    Standing Unsupported with Eyes Closed Able to stand 10 seconds safely    Standing Unsupported with Feet Together Able to place feet together independently and stand 1 minute safely    From Standing, Reach Forward with Outstretched Arm Can reach forward >12 cm safely (5)    From Standing Position, Pick up Object from Floor Able to pick up shoe, needs supervision    From Standing Position, Turn to Look Behind Over each Shoulder Looks behind from both sides and weight shifts well    Turn 360 Degrees Able to turn 360 degrees safely but slowly    Standing Unsupported, Alternately Place Feet on Step/Stool Able to stand independently and complete 8 steps >20 seconds    Standing Unsupported, One Foot in Front Able to place foot ahead of the other independently and hold 30 seconds    Standing on One Leg Tries to lift leg/unable to hold 3 seconds but remains standing independently    Total Score 47    Berg comment: 46-51 = Moderate (>50%) fall risk      Functional Gait  Assessment   Gait Level Surface Walks 20 ft, slow speed, abnormal gait pattern, evidence for imbalance or deviates 10-15 in outside of the 12 in walkway width. Requires more than 7 sec to ambulate 20 ft.    Change in Gait Speed Able to change speed, demonstrates mild gait deviations, deviates 6-10 in outside of the 12 in walkway width, or no gait deviations, unable to achieve a major change in velocity, or uses a change in velocity, or uses an assistive device.    Gait with Horizontal Head Turns Performs head turns smoothly with slight change in gait velocity (eg, minor disruption to smooth gait path), deviates 6-10 in outside 12 in walkway width, or uses an assistive device.    Gait with Vertical Head Turns Performs head turns with no change in gait. Deviates no more than 6 in outside 12 in walkway width.    Gait and Pivot Turn Pivot turns safely in greater than 3 sec and stops with no loss of balance,  or pivot turns safely within 3 sec and stops with mild imbalance, requires small steps to catch balance.    Step Over Obstacle Is able to step over one shoe box (4.5 in total height) but must slow down and adjust steps to clear box safely. May require verbal cueing.    Gait with Narrow Base of Support Ambulates less than 4 steps heel to toe or cannot perform without assistance.    Gait with Eyes Closed Walks 20 ft, slow speed, abnormal gait pattern, evidence for imbalance, deviates 10-15 in outside 12 in walkway width. Requires more than 9 sec to ambulate 20 ft.    Ambulating Backwards Walks 20 ft, slow speed, abnormal gait pattern, evidence for imbalance, deviates 10-15 in outside 12 in walkway width.    Steps Two feet to a stair, must use rail.    Total Score 14    FGA comment: < 19 = high risk fall      THERAPEUTIC EXERCISE: To improve strength, endurance, ROM, and flexibility.  Demonstration, verbal and tactile cues throughout for technique.  NuStep - L3 x 6 min (UE/LE) Standing hip ABD x 10 bil, UE support on back of chair for balance  Standing hip extension x 10 bil, UE  support on back of chair for balance  Standing hip flexion march 2# x 10 bil, UE support on back of chair for balance  Standing heel & toe raises x 20   03/16/2024  SELF CARE:  Reviewed eval findings and role of PT in addressing identified deficits as well as review of HH PT HEP:  Supine heel slide Supine hip ABD/ADD Hookying SLR Hooklying SAQ Standing hip ABD Standing hip extension Standing heel raises - instructed pt to add alternating toe raises   PATIENT EDUCATION:  Education details: standardized testing results and interpretation, progress with PT, and continue with current HEP  Person educated: Patient Education method: Explanation Education comprehension: verbalized understanding  HOME EXERCISE PROGRAM: Access Code: [email or web address] URL: [email or web address] Date: 03/31/2024 Prepared by:  Elijah Hidden  Exercises - Standing Hip Flexion with Anchored Resistance and Chair Support  - 1 x daily - 3 x weekly - 2 sets - 10 reps - 3 sec hold - Standing Hip Adduction with Anchored Resistance  - 1 x daily - 3 x weekly - 2 sets - 10 reps - 3 sec hold - Standing Hip Extension with Anchored Resistance  - 1 x daily - 3 x weekly - 2 sets - 10 reps - 3 sec hold - Standing Hip Abduction with Anchored Resistance  - 1 x daily - 3 x weekly - 2 sets - 10 reps - 3 sec hold - Hip Hiking on Step  - 1 x daily - 3 x weekly - 2 sets - 10 reps - 3 sec hold  HH PT HEP + 2# ankle cuff weights Supine heel slide Supine hip ABD/ADD Hookying SLR Hooklying SAQ Standing hip ABD Standing hip extension Standing heel raises - instructed pt to add alternating toe raises   ASSESSMENT:  CLINICAL IMPRESSION: Tina Hinton reports she is using the cane less recently but did bring it today due to the wet weather.  She is demonstrating consistent progress with PT with 5xSTS time reduced to 12.53 sec w/o UE assist and TUG time reduced to 9.62 sec w/o AD, now both below the increased fall risk thresholds.  Continued focus on dynamic strengthening to promote improved balance reactions for decreased fall risk with decreasing need for UE support but still requiring intermittent seated rest breaks due to fatigue.  Tina Hinton will benefit from continued skilled PT to address ongoing ROM, coordination, strength and balance deficits to improve mobility and activity tolerance with decreased pain interference and decreased risk for falls.    EVAL: Tina Hinton is a 68 y.o. female who was referred to physical therapy for evaluation and treatment s/p L hip THA via anterior approach on 02/26/2024.  Patient reports continued mild L hip pain since surgery, but not as intense as before surgery.  Pain is worse when she is working on her [facility] PT exercises.  Patient has deficits in L hip ROM, B LE flexibility, BLE strength, abnormal posture  with potential leg length discrepancy, and altered gait pattern which are interfering with ADLs and are impacting quality of life.  On LEFS patient scored 21/80 demonstrating moderate functional limitation.  She expresses a significant concern regarding fear of falling, with ABC scale revealing only 32.5% balance confidence, indicative of a low level of physical functioning, therefore we will plan for further standardized balance testing next visit.  Shine Scrogham will benefit from skilled PT to address above deficits to improve mobility and activity tolerance with decreased pain interference and decreased risk for falls.  OBJECTIVE IMPAIRMENTS: Abnormal  gait, decreased activity tolerance, decreased balance, decreased coordination, decreased knowledge of condition, decreased knowledge of use of DME, decreased mobility, difficulty walking, decreased ROM, decreased strength, increased fascial restrictions, impaired perceived functional ability, increased muscle spasms, impaired flexibility, impaired sensation, improper body mechanics, postural dysfunction, and pain.   ACTIVITY LIMITATIONS: carrying, lifting, bending, sitting, standing, squatting, sleeping, stairs, transfers, bed mobility, bathing, dressing, and locomotion level  PARTICIPATION LIMITATIONS: meal prep, cleaning, laundry, driving, shopping, community activity, occupation, and yard work  PERSONAL FACTORS: Fitness, Past/current experiences, Profession, Time since onset of injury/illness/exacerbation, and 3+ comorbidities: Anxiety, OA, asthma, depression, DOE, GERD, HLD, IBS, LBP, obesity, SVT and vertigo are also affecting patient's functional outcome.   REHAB POTENTIAL: Good  CLINICAL DECISION MAKING: Evolving/moderate complexity  EVALUATION COMPLEXITY: Moderate   GOALS: Goals reviewed with patient? Yes  SHORT TERM GOALS: Target date: 04/13/2024  Patient will be independent with initial HEP. Baseline: Reviewed HH PT HEP on  eval 03/18/24 - guidance provided on how to add resistance with current HH PT HEP using 2# ankle cuff weights Goal status: MET - 04/04/24  2.  Patient will report at least 25% improvement in L hip pain to improve QOL. Baseline: 1-2/10 Goal status: MET  3.  Complete standardized balance testing and update LTG's as indicated Baseline: Berg and FGA vs DGI pending Goal status: MET - 03/18/24  4.  Patient will improve 5xSTS time to </= 15 seconds to demonstrate improved functional strength and transfer efficiency  Baseline: 18.97 sec Goal status: MET - 04/04/24 - 12.53 sec w/o UE assist  5.  Patient will demonstrate decreased TUG time to </= 13.5 sec to decrease risk for falls with transitional mobility Baseline: 17.69 with 4WW; 15.66 sec w/o AD Goal status: MET - 04/04/24 - 9.62 sec w/o AD  LONG TERM GOALS: Target date: 05/11/2024  Patient will be independent with advanced/ongoing HEP to improve outcomes and carryover.  Baseline:  Goal status: INITIAL  2.  Patient will report at least 50-75% improvement in L hip pain to improve QOL. Baseline: 1-2/10 Goal status: INITIAL  3.  Patient will demonstrate improved B LE strength to >/= 4 to 4+/5 for improved stability and ease of mobility. Baseline: Refer to above LE MMT table Goal status: INITIAL  4.  Patient will be able to ambulate 600' with or w/o LRAD and normal gait pattern without increased pain to access community.  Baseline: Currently reliant on 4WW for almost all ambulation - limited distances Goal status: IN PROGRESS - 03/18/24 - Pt using SPC for ambulation today  5.  Patient will report >/= 40/80 on LEFS (MCID = 9 pts) to demonstrate improved functional ability. Baseline:  21 / 80 = 26.3 % Goal status: INITIAL  6.  Patient will report >/= 50% average balance confidence on ABC to demonstrate at least moderate level of physical functioning and decreased risk for falls. Baseline: 520 / 1600 = 32.5 %, low level of physical  functioning Goal status: INITIAL   7.  Patient will demonstrate at least 19/24 on DGI or 19/30 on FGA to decrease risk of falls. Baseline: 03/18/24 - FGA = 14/30 Goal status: IN PROGRESS    PLAN:  PT FREQUENCY: 2x/week  PT DURATION: 8 weeks  PLANNED INTERVENTIONS: [id number]- PT Re-evaluation, 97750- Physical Performance Testing, 97110-Therapeutic exercises, 97530- Therapeutic activity, [id number]- Neuromuscular re-education, 97535- Self Care, [id number]- Manual therapy, [id number]- Gait training, [phone]- Aquatic Therapy, [phone]- Electrical stimulation (unattended), 97035- Ultrasound, 02966- Ionotophoresis 4mg /ml Dexamethasone, 79439 (1-2 muscles), 20561 (3+ muscles)- Dry  Needling, Patient/Family education, Balance training, Stair training, Taping, Joint mobilization, Scar mobilization, DME instructions, Cryotherapy, and Moist heat  PLAN FOR NEXT SESSION: MD PN for appt 04/07/24; continue to add balance activities; re-evaluate potential leg length discrepancy at ~6 weeks postop   Elijah CHRISTELLA Hidden, PT 04/04/2024, 8:50 AM

## 2024-04-06 ENCOUNTER — Encounter: Payer: Self-pay | Admitting: Physical Therapy

## 2024-04-06 ENCOUNTER — Ambulatory Visit: Admitting: Physical Therapy

## 2024-04-06 DIAGNOSIS — R2689 Other abnormalities of gait and mobility: Secondary | ICD-10-CM

## 2024-04-06 DIAGNOSIS — M6281 Muscle weakness (generalized): Secondary | ICD-10-CM | POA: Diagnosis not present

## 2024-04-06 DIAGNOSIS — R2681 Unsteadiness on feet: Secondary | ICD-10-CM

## 2024-04-06 DIAGNOSIS — M25552 Pain in left hip: Secondary | ICD-10-CM

## 2024-04-06 NOTE — Therapy (Signed)
 OUTPATIENT PHYSICAL THERAPY TREATMENT  Progress Note  Reporting Period 03/16/2024 to 04/06/2024   See note below for Objective Data and Assessment of Progress/Goals.     Patient Name: Tina Hinton MRN: [id number] DOB:07-17-1955, 68 y.o., female Today's Date: 04/06/2024   END OF SESSION:  PT End of Session - 04/06/24 0839     Visit Number 6    Date for Recertification  05/11/24    Authorization Type Federal BC[employer]    PT Start Time [phone]    PT Stop Time 0929    PT Time Calculation (min) 50 min    Activity Tolerance Patient tolerated treatment well    Behavior During Therapy [facility] for tasks assessed/performed               Past Medical History:  Diagnosis Date   Ankle fracture    Right   Anxiety    Arthritis    hands, knees   Asthma    Bilateral knee pain    Cancer (HCC)    skin cancer   Chest pain    Chicken pox    Clavicle fracture    Left   Complication of anesthesia    Depression    Dysrhythmia    Foot fracture, left    GERD (gastroesophageal reflux disease)    History of hiatal hernia    Hyperlipidemia    IBS (irritable bowel syndrome) 01/19/2017   Joint pain    Low back pain    Osteoarthritis    Other fatigue    Palpitations    Panic attacks    Seasonal allergies    Shortness of breath on exertion    SVT (supraventricular tachycardia)    Swallowing difficulty    Uterine polyp    ablation   Past Surgical History:  Procedure Laterality Date   BIOSPY     HEAD   CESAREAN SECTION     COLONOSCOPY     ENDOMETRIAL ABLATION     MANDIBLE SURGERY     TONSILLECTOMY     TOTAL HIP ARTHROPLASTY Left 02/26/2024   Procedure: ARTHROPLASTY, HIP, TOTAL, ANTERIOR APPROACH;  Surgeon: Vernetta Lonni GRADE, MD;  Location: [facility];  Service: Orthopedics;  Laterality: Left;   TUBAL LIGATION     UPPER GASTROINTESTINAL ENDOSCOPY     Patient Active Problem List   Diagnosis Date Noted   Status post total replacement of left hip 02/26/2024    Claustrophobia 10/15/2023   Vitamin D  deficiency 06/25/2021   Class 3 severe obesity with serious comorbidity and body mass index (BMI) of 45.0 to 49.9 in adult (HCC) 03/12/2021   Prediabetes 03/12/2021   Lumbar strain, subsequent encounter 02/14/2021   Asthma    Vertigo 01/31/2020   Aortic atherosclerosis 02/15/2018   IBS (irritable bowel syndrome) - D 01/19/2017   Primary osteoarthritis of both knees 01/19/2017   Preventative health care 07/30/2015   Obesity, unspecified 07/30/2015   Anxiety 06/25/2015   Hyperlipidemia LDL goal <100 06/25/2015   Sprain of medial collateral ligament of left knee 06/25/2015   Osteoarthritis of both knees 06/25/2015   Degeneration of intervertebral disc of lumbar region 01/04/2015   Spondylolisthesis at L5-S1 level 01/04/2015   Fracture of lateral malleolus 11/14/2014   Foot, fracture, navicular 11/14/2014    PCP: Antonio Cyndee Jamee JONELLE, DO   REFERRING PROVIDER: Vernetta Lonni GRADE, MD  REFERRING DIAG: [id number] (ICD-10-CM) - Status post total replacement of left hip   THERAPY DIAG:  Muscle weakness (generalized)  Pain in left hip  Unsteadiness on feet  Other abnormalities of gait and mobility  RATIONALE FOR EVALUATION AND TREATMENT: Rehabilitation  ONSET DATE: 02/26/2024 - L THA (anterior approach)  NEXT MD VISIT: 04/07/2024   SUBJECTIVE:                                                                                                                                                                                                         SUBJECTIVE STATEMENT: Patient denies any pain this morning but recognizes that her incision is still healing.  EVAL: Patient is ~2.5 weeks status post L THA via anterior approach on 02/26/2024.  She initially had 2 weeks of HH PT which was completed on 03/11/24.  She continues to experience what she feels is bone pain/discomfort in the L hip but nothing like than pain she had before surgery.  Also notes  numbness along the surgical incision.  Still using the 4WW full time due to fear of falling - she states she has been using the 4WW or cane for the past 9 months due to limited standing/walking tolerance and fear of falling.  She would like to eventually try to wean from all ADs.  Sleeping has been difficult as she normally sleeps on her side and cannot comfortably right now - sleeping on her back makes her back hurt.  She thinks she may have a leg length discrepancy following the THA surgery.  PAIN: Are you having pain? No and Yes: NPRS scale: 0/10   Pain location: L hip/buttock   PERTINENT HISTORY:  Anxiety, OA, asthma, depression, DOE, GERD, HLD, IBS, LBP, obesity, SVT and vertigo.   PRECAUTIONS: Anterior hip  RED FLAGS: None  WEIGHT BEARING RESTRICTIONS: No  FALLS:  Has patient fallen in last 6 months? Yes. Number of falls 1   LIVING ENVIRONMENT: Lives with: lives with their family Lives in: House Stairs: Yes: Internal: to basement - 12 steps; on right going up, on left going up, and can reach both and External: 2+1 steps; on left going up Has following equipment at home: Single point cane, Walker - 2 wheeled, Environmental Consultant - 4 wheeled, and shower chair  OCCUPATION: Works from home  PLOF: Independent and Leisure: gardening (not this year but wants to get back to that next year), [employer] water exercises   PATIENT GOALS: To walk w/o a walker or cane with a steady gait.   OBJECTIVE: (objective measures completed at initial evaluation unless otherwise dated)  DIAGNOSTIC FINDINGS:  02/26/24 - DG Pelvis IMPRESSION: 1. Left hip arthroplasty without immediate postoperative complication.  PATIENT SURVEYS:  ABC scale: The Activities-Specific Balance Confidence (ABC) Scale 0% 10 20 30  40 50 60 70 80 90 100% No confidence<->completely confident  "How confident are you that you will not lose your balance or become unsteady when you . . .  Date tested: 03/16/2024  04/06/2024   Walk around  the house 70%   2. Walk up or down stairs 50%   3. Bend over and pick up a slipper from in front of a closet floor 30%   4. Reach for a small can off a shelf at eye level 50%   5. Stand on tip toes and reach for something above your head 30%   6. Stand on a chair and reach for something 0%   7. Sweep the floor 40%   8. Walk outside the house to a car parked in the driveway 40%   9. Get into or out of a car 40%   10. Walk across a parking lot to the mall 30%   11. Walk up or down a ramp 60%   12. Walk in a crowded mall where people rapidly walk past you 30%   13. Are bumped into by people as you walk through the mall 30%   14. Step onto or off of an escalator while you are holding onto the railing 20%   15. Step onto or off an escalator while holding onto parcels such that you cannot hold onto the railing 0%   16. Walk outside on icy sidewalks 0%   Total: #/16 520 / 1600 = 32.5 % 1260 / 1600 = 78.8 %  Level of physical functioning:  Low Moderate    LEFS  Extreme difficulty/unable (0), Quite a bit of difficulty (1), Moderate difficulty (2), Little difficulty (3), No difficulty (4) Survey date:  03/16/2024  04/06/2024   Any of your usual work, housework or school activities 2 3  2. Usual hobbies, recreational or sporting activities 3 3  3. Getting into/out of the bath 1 4  4. Walking between rooms 4 4  5. Putting on socks/shoes 2 4  6. Squatting  0 3  7. Lifting an object, like a bag of groceries from the floor 0 4  8. Performing light activities around your home 2 4  9. Performing heavy activities around your home 0 3  10. Getting into/out of a car 2 4  11. Walking 2 blocks 0 4  12. Walking 1 mile 0 1  13. Going up/down 10 stairs (1 flight) 2 4  14. Standing for 1 hour 0 0  15.  sitting for 1 hour 2 0  16. Running on even ground 0 0  17. Running on uneven ground 0 0  18. Making sharp turns while running fast 0 0  19. Hopping  0 0  20. Rolling over in bed 1 3  Score total:   21  / 80 = 26.3 % 48 / 80 = 60.0 %  Functional Limitation:  Moderate Mild to moderate    COGNITION: Overall cognitive status: Within functional limits for tasks assessed    SENSATION: WFL Except numbness along surgical incision  POSTURE:  Apparent ~1 cm leg length discrepancy (L longer than R) with L iliac crest sitting higher  MUSCLE LENGTH: (TBA as indicated) Hamstrings:  ITB:  Piriformis:  Hip flexors:  Quads:  Heelcord:   LOWER EXTREMITY ROM:  Active ROM Right eval Left eval  Hip flexion    Hip extension    Hip abduction  Hip adduction    Hip internal rotation    Hip external rotation    Knee flexion    Knee extension    Ankle dorsiflexion    Ankle plantarflexion    Ankle inversion    Ankle eversion    (Blank rows = not tested)  LOWER EXTREMITY MMT: (all testing in sitting on eval)  MMT Right eval Left eval R 04/06/24 L 04/06/24  Hip flexion 4+ 3- 4+ 4  Hip extension 4- 3+ 4- 4-  Hip abduction 4 3+ 4 4-  Hip adduction 4 3+ 4 4  Hip internal rotation 4 2+ 4+ 4-  Hip external rotation 4 3+ 4+ 4-  Knee flexion 5 5 5 5   Knee extension 5 4 5 5   Ankle dorsiflexion 4- 4- 4+ 4+  Ankle plantarflexion      Ankle inversion      Ankle eversion       (Blank rows = not tested)  FUNCTIONAL TESTS:  5 times sit to stand: 18.97 sec Timed up and go (TUG): 17.69 with 4WW; 15.66 sec w/o AD 10 meter walk test: 13.60 sec with 4WW; 16.00 sec w/o AD Berg Balance Scale: 03/18/24 - 47/56, indicating moderate (>50%) risk for falls FGA: 03/18/24 - 14/30, indicating high risk for falls  GAIT: Distance walked: clinic distances Assistive device utilized: Environmental Consultant - 4 wheeled and None Level of assistance: Modified independence and SBA Gait pattern: step through pattern, lateral hip instability with L lateral lean to compensate for R hip drop/Trendelenburg     TODAY'S TREATMENT:   04/06/2024  THERAPEUTIC EXERCISE: To improve strength, endurance, and ROM.    NuStep - L5 x  6'  THERAPEUTIC ACTIVITIES: To improve functional performance.  Demonstration, verbal and tactile cues throughout for technique. LE MMT - see above MMT table LEFS: 48 / 80 = 60.0 % ABC scale: 1260 / 1600 = 78.8 % TRX lateral lunge x 10 bil  PHYSICAL PERFORMANCE TEST or MEASUREMENT: Functional Gait  Assessment  Gait Level Surface Walks 20 ft in less than 7 sec but greater than 5.5 sec, uses assistive device, slower speed, mild gait deviations, or deviates 6-10 in outside of the 12 in walkway width.   Change in Gait Speed Able to smoothly change walking speed without loss of balance or gait deviation. Deviate no more than 6 in outside of the 12 in walkway width.   Gait with Horizontal Head Turns Performs head turns smoothly with no change in gait. Deviates no more than 6 in outside 12 in walkway width   Gait with Vertical Head Turns Performs head turns with no change in gait. Deviates no more than 6 in outside 12 in walkway width.   Gait and Pivot Turn Pivot turns safely within 3 sec and stops quickly with no loss of balance.   Step Over Obstacle Is able to step over one shoe box (4.5 in total height) without changing gait speed. No evidence of imbalance.   Gait with Narrow Base of Support Ambulates 4-7 steps.   Gait with Eyes Closed Walks 20 ft, slow speed, abnormal gait pattern, evidence for imbalance, deviates 10-15 in outside 12 in walkway width. Requires more than 9 sec to ambulate 20 ft.   Ambulating Backwards Walks 20 ft, uses assistive device, slower speed, mild gait deviations, deviates 6-10 in outside 12 in walkway width.   Steps Alternating feet, must use rail.   Total Score 22   FGA comment: 19-24 = medium risk fall  SELF CARE:  Provided education on post-op incisional scar tissue mobilization as well as retrograde massage to help with restoration of normal sensation and reduced edema.   04/04/2024  THERAPEUTIC EXERCISE: To improve strength and endurance.    Rec Bike - L2  x 7'  THERAPEUTIC ACTIVITIES: To improve functional performance.  Demonstration, verbal and tactile cues throughout for technique. 5xSTS = 12.53 sec w/o UE assist TUG = 9.62 sec w/o AD TRX squat 2 x 10  NEUROMUSCULAR RE-EDUCATION: To improve balance, coordination, kinesthesia, proprioception, and reduce fall risk. Fitter hip ABD (1 black, 1 blue) 2 x 10 bil Fitter hip extension (1 black, 1 blue) 2 x 10 bil, intermittent UE support on back of chair L/R SLS + 5-way step/toe tap to colored dots x 1 cycle,  L/R SLS + 6-way (fwd cross-over) step/toe tap to colored dots x 2 cycles L/R SLS + 5-way slides to colored dots x 5 cycles   03/31/2024  THERAPEUTIC EXERCISE: To improve strength and endurance.  Demonstration, verbal and tactile cues throughout for technique.  Rec Bike - L2 x 7' SLS on 2 step + opp LE hip hike x 5 bil  NEUROMUSCULAR RE-EDUCATION: To improve balance, coordination, kinesthesia, posture, proprioception, and reduce fall risk. L SLS + 5-way step/toe tap to colored dots x 5 cycles L SLS + 5-way slides to colored dots x 5 cycles L/R SLS + opp LE RTB 4-way SLR x 10, intermittent UE support on back of chair B side-stepping on balance beam x 4 passes - occasional   03/28/24 THERAPEUTIC EXERCISE: To improve endurance and ROM.  Demonstration, verbal and tactile cues throughout for technique. NuStep L4 x 6'  Foam stand:  EO x 1'   EC x 1'  toe/heel raises x 20  squats x 2/10   turning 360 deg x 2 each direction lateral step ups to foam x 10 LLE Medium swiss ball bouncing EO x 1'; EC x 1';  alternating knee ext x 10 BLE SLS at counter on LLE Seated ankle inversion and eversion w/ YTB x 2/10 BLE   03/18/2024 PHYSICAL PERFORMANCE TEST or MEASUREMENT:  Berg Balance Test   Sit to Stand Able to stand without using hands and stabilize independently    Standing Unsupported Able to stand safely 2 minutes    Sitting with Back Unsupported but Feet Supported on Floor or Stool  Able to sit safely and securely 2 minutes    Stand to Sit Sits safely with minimal use of hands    Transfers Able to transfer safely, minor use of hands    Standing Unsupported with Eyes Closed Able to stand 10 seconds safely    Standing Unsupported with Feet Together Able to place feet together independently and stand 1 minute safely    From Standing, Reach Forward with Outstretched Arm Can reach forward >12 cm safely (5)    From Standing Position, Pick up Object from Floor Able to pick up shoe, needs supervision    From Standing Position, Turn to Look Behind Over each Shoulder Looks behind from both sides and weight shifts well    Turn 360 Degrees Able to turn 360 degrees safely but slowly    Standing Unsupported, Alternately Place Feet on Step/Stool Able to stand independently and complete 8 steps >20 seconds    Standing Unsupported, One Foot in Front Able to place foot ahead of the other independently and hold 30 seconds    Standing on One Leg Tries to lift leg/unable to  hold 3 seconds but remains standing independently    Total Score 47    Berg comment: 46-51 = Moderate (>50%) fall risk      Functional Gait  Assessment   Gait Level Surface Walks 20 ft, slow speed, abnormal gait pattern, evidence for imbalance or deviates 10-15 in outside of the 12 in walkway width. Requires more than 7 sec to ambulate 20 ft.    Change in Gait Speed Able to change speed, demonstrates mild gait deviations, deviates 6-10 in outside of the 12 in walkway width, or no gait deviations, unable to achieve a major change in velocity, or uses a change in velocity, or uses an assistive device.    Gait with Horizontal Head Turns Performs head turns smoothly with slight change in gait velocity (eg, minor disruption to smooth gait path), deviates 6-10 in outside 12 in walkway width, or uses an assistive device.    Gait with Vertical Head Turns Performs head turns with no change in gait. Deviates no more than 6 in outside  12 in walkway width.    Gait and Pivot Turn Pivot turns safely in greater than 3 sec and stops with no loss of balance, or pivot turns safely within 3 sec and stops with mild imbalance, requires small steps to catch balance.    Step Over Obstacle Is able to step over one shoe box (4.5 in total height) but must slow down and adjust steps to clear box safely. May require verbal cueing.    Gait with Narrow Base of Support Ambulates less than 4 steps heel to toe or cannot perform without assistance.    Gait with Eyes Closed Walks 20 ft, slow speed, abnormal gait pattern, evidence for imbalance, deviates 10-15 in outside 12 in walkway width. Requires more than 9 sec to ambulate 20 ft.    Ambulating Backwards Walks 20 ft, slow speed, abnormal gait pattern, evidence for imbalance, deviates 10-15 in outside 12 in walkway width.    Steps Two feet to a stair, must use rail.    Total Score 14    FGA comment: < 19 = high risk fall      THERAPEUTIC EXERCISE: To improve strength, endurance, ROM, and flexibility.  Demonstration, verbal and tactile cues throughout for technique.  NuStep - L3 x 6 min (UE/LE) Standing hip ABD x 10 bil, UE support on back of chair for balance  Standing hip extension x 10 bil, UE support on back of chair for balance  Standing hip flexion march 2# x 10 bil, UE support on back of chair for balance  Standing heel & toe raises x 20   03/16/2024  SELF CARE:  Reviewed eval findings and role of PT in addressing identified deficits as well as review of HH PT HEP:  Supine heel slide Supine hip ABD/ADD Hookying SLR Hooklying SAQ Standing hip ABD Standing hip extension Standing heel raises - instructed pt to add alternating toe raises   PATIENT EDUCATION:  Education details: standardized testing results and interpretation, progress with PT, and continue with current HEP  Person educated: Patient Education method: Explanation Education comprehension: verbalized  understanding  HOME EXERCISE PROGRAM: Access Code: [email or web address] URL: [email or web address] Date: 03/31/2024 Prepared by: Elijah Hidden  Exercises - Standing Hip Flexion with Anchored Resistance and Chair Support  - 1 x daily - 3 x weekly - 2 sets - 10 reps - 3 sec hold - Standing Hip Adduction with Anchored Resistance  - 1 x daily - 3 x weekly -  2 sets - 10 reps - 3 sec hold - Standing Hip Extension with Anchored Resistance  - 1 x daily - 3 x weekly - 2 sets - 10 reps - 3 sec hold - Standing Hip Abduction with Anchored Resistance  - 1 x daily - 3 x weekly - 2 sets - 10 reps - 3 sec hold - Hip Hiking on Step  - 1 x daily - 3 x weekly - 2 sets - 10 reps - 3 sec hold  HH PT HEP + 2# ankle cuff weights Supine heel slide Supine hip ABD/ADD Hookying SLR Hooklying SAQ Standing hip ABD Standing hip extension Standing heel raises - instructed pt to add alternating toe raises   ASSESSMENT:  CLINICAL IMPRESSION: Cailah Reach reports nearly 100% improvement in her L hip pain since surgery.  She continues to note some lack of sensation in the vicinity of her surgical incision.  Education provided for scar tissue mobilization as well as desensitization and retrograde massage to help with persistent edema in this area.  She notes improving functional strength with increased comfort ascending/descending stairs but is still hesitant on inclines.  She is more consistently ambulating without an AD, and is looking forward to retiring her walker.  Her gait pattern is normalizing but still demonstrates slight lateral hip instability with mild L lateral lean to compensate for R hip drop/Trendelenburg due to leg length discrepancy.  MMT revealing improving overall B LE strength however L hip remains weaker than R for most motions.  Gains observed across all standardized balance tests with 5xSTS and TUG times now WNL, and FGA score improved from 14/30 to 22/30 indicating a reduction in fall risk from  high to medium.  Above gains/improvements mirrored in patient reported outcome measures with LEFS improved from 21/80 to 48/80 indicating decreasing functional limitation, as well as ABC scale improved from 32.5% to 78.8% balance confidence indicating improving physical function and decreased risk for falls.  Mar is progressing well toward her PT goals, having met all of her STG's as well as 3 of her LTG's, and will benefit from continued skilled PT to address ongoing ROM, coordination, strength and balance deficits to improve mobility and activity tolerance with decreased pain interference and decreased risk for falls.    EVAL: Dyamond Tolosa is a 68 y.o. female who was referred to physical therapy for evaluation and treatment s/p L hip THA via anterior approach on 02/26/2024.  Patient reports continued mild L hip pain since surgery, but not as intense as before surgery.  Pain is worse when she is working on her [facility] PT exercises.  Patient has deficits in L hip ROM, B LE flexibility, BLE strength, abnormal posture with potential leg length discrepancy, and altered gait pattern which are interfering with ADLs and are impacting quality of life.  On LEFS patient scored 21/80 demonstrating moderate functional limitation.  She expresses a significant concern regarding fear of falling, with ABC scale revealing only 32.5% balance confidence, indicative of a low level of physical functioning, therefore we will plan for further standardized balance testing next visit.  Ludwika Rodd will benefit from skilled PT to address above deficits to improve mobility and activity tolerance with decreased pain interference and decreased risk for falls.  OBJECTIVE IMPAIRMENTS: Abnormal gait, decreased activity tolerance, decreased balance, decreased coordination, decreased knowledge of condition, decreased knowledge of use of DME, decreased mobility, difficulty walking, decreased ROM, decreased strength, increased fascial  restrictions, impaired perceived functional ability, increased muscle spasms, impaired flexibility, impaired  sensation, improper body mechanics, postural dysfunction, and pain.   ACTIVITY LIMITATIONS: carrying, lifting, bending, sitting, standing, squatting, sleeping, stairs, transfers, bed mobility, bathing, dressing, and locomotion level  PARTICIPATION LIMITATIONS: meal prep, cleaning, laundry, driving, shopping, community activity, occupation, and yard work  PERSONAL FACTORS: Fitness, Past/current experiences, Profession, Time since onset of injury/illness/exacerbation, and 3+ comorbidities: Anxiety, OA, asthma, depression, DOE, GERD, HLD, IBS, LBP, obesity, SVT and vertigo are also affecting patient's functional outcome.   REHAB POTENTIAL: Good  CLINICAL DECISION MAKING: Evolving/moderate complexity  EVALUATION COMPLEXITY: Moderate   GOALS: Goals reviewed with patient? Yes  SHORT TERM GOALS: Target date: 04/13/2024  Patient will be independent with initial HEP. Baseline: Reviewed HH PT HEP on eval 03/18/24 - guidance provided on how to add resistance with current HH PT HEP using 2# ankle cuff weights Goal status: MET - 04/04/24  2.  Patient will report at least 25% improvement in L hip pain to improve QOL. Baseline: 1-2/10 Goal status: MET  3.  Complete standardized balance testing and update LTG's as indicated Baseline: Berg and FGA vs DGI pending Goal status: MET - 03/18/24  4.  Patient will improve 5xSTS time to </= 15 seconds to demonstrate improved functional strength and transfer efficiency  Baseline: 18.97 sec Goal status: MET - 04/04/24 - 12.53 sec w/o UE assist  5.  Patient will demonstrate decreased TUG time to </= 13.5 sec to decrease risk for falls with transitional mobility Baseline: 17.69 with 4WW; 15.66 sec w/o AD Goal status: MET - 04/04/24 - 9.62 sec w/o AD  LONG TERM GOALS: Target date: 05/11/2024  Patient will be independent with advanced/ongoing HEP to  improve outcomes and carryover.  Baseline:  Goal status: IN PROGRESS - 04/06/24 - met for current HEP  2.  Patient will report at least 50-75% improvement in L hip pain to improve QOL. Baseline: 1-2/10 Goal status: MET - 04/06/24 - Pt reports 100% improvement in pain, still has a little discomfort at the surgical incision  3.  Patient will demonstrate improved B LE strength to >/= 4 to 4+/5 for improved stability and ease of mobility. Baseline: Refer to above LE MMT table Goal status: IN PROGRESS - 04/06/24 - overall improvement in B LE  4.  Patient will be able to ambulate 600' with or w/o LRAD and normal gait pattern without increased pain to access community.  Baseline: Currently reliant on 4WW for almost all ambulation - limited distances Goal status: IN PROGRESS - 03/18/24 - Pt using SPC for ambulation today  5.  Patient will report >/= 40/80 on LEFS (MCID = 9 pts) to demonstrate improved functional ability. Baseline:  21 / 80 = 26.3 % Goal status: MET - 04/06/24 - 48 / 80 = 60.0 %  6.  Patient will report >/= 50% average balance confidence on ABC to demonstrate at least moderate level of physical functioning and decreased risk for falls. Baseline: 520 / 1600 = 32.5 %, low level of physical functioning Goal status: MET - 04/06/24 - 1260 / 1600 = 78.8 %  7.  Patient will demonstrate at least 19/24 on DGI or 19/30 on FGA to decrease risk of falls. Baseline: 03/18/24 - FGA = 14/30 Goal status: IN PROGRESS - 04/06/24 - FGA = 22/30     PLAN:  PT FREQUENCY: 2x/week  PT DURATION: 8 weeks  PLANNED INTERVENTIONS: 97164- PT Re-evaluation, 97750- Physical Performance Testing, 97110-Therapeutic exercises, 97530- Therapeutic activity, 97112- Neuromuscular re-education, 97535- Self Care, 02859- Manual therapy, [id number]- Gait training, [phone]-  Aquatic Therapy, [phone]- Electrical stimulation (unattended), [id number]- Ultrasound, 02966- Ionotophoresis 4mg /ml Dexamethasone, 20560 (1-2 muscles), 20561 (3+  muscles)- Dry Needling, Patient/Family education, Balance training, Stair training, Taping, Joint mobilization, Scar mobilization, DME instructions, Cryotherapy, and Moist heat  PLAN FOR NEXT SESSION: Proximal LE strengthening, increasing unilateral activities; continue to add balance activities; re-evaluate potential leg length discrepancy at ~6 weeks postop   Elijah CHRISTELLA Hidden, PT 04/06/2024, 9:32 AM

## 2024-04-07 ENCOUNTER — Ambulatory Visit (INDEPENDENT_AMBULATORY_CARE_PROVIDER_SITE_OTHER): Admitting: Orthopaedic Surgery

## 2024-04-07 ENCOUNTER — Other Ambulatory Visit: Payer: Self-pay

## 2024-04-07 DIAGNOSIS — Z96642 Presence of left artificial hip joint: Secondary | ICD-10-CM

## 2024-04-07 NOTE — Progress Notes (Signed)
 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  She has now lost over 50 pounds.  She is walking with a cane only on occasion when she needs it.  She reports improvement in her mobility, quality of life and her actives daily living.  Her left hip looks good.  Has good range of motion overall.  The incision is healed nicely.  Prior to surgery she had done some aquatic therapy.  She would like to have 2-3 more sessions of aquatic therapy at this point so they can teach her some things that she can do in her local [facility] pool.  I think this is absolutely reasonable and she can submerge underwater from my standpoint and there are no restrictions.  We will order therapy at [facility] for aquatic therapy.  From our standpoint we will see her back in 6 months unless there are other issues before then.  At that visit we will have a standing AP pelvis and lateral of her left operative hip.

## 2024-04-11 ENCOUNTER — Encounter: Payer: Self-pay | Admitting: Physical Therapy

## 2024-04-11 ENCOUNTER — Encounter: Payer: Self-pay | Admitting: Radiology

## 2024-04-11 ENCOUNTER — Ambulatory Visit: Attending: Orthopaedic Surgery | Admitting: Physical Therapy

## 2024-04-11 DIAGNOSIS — R2689 Other abnormalities of gait and mobility: Secondary | ICD-10-CM | POA: Insufficient documentation

## 2024-04-11 DIAGNOSIS — M25552 Pain in left hip: Secondary | ICD-10-CM | POA: Diagnosis present

## 2024-04-11 DIAGNOSIS — R2681 Unsteadiness on feet: Secondary | ICD-10-CM | POA: Diagnosis present

## 2024-04-11 DIAGNOSIS — M6281 Muscle weakness (generalized): Secondary | ICD-10-CM | POA: Insufficient documentation

## 2024-04-11 NOTE — Therapy (Signed)
 OUTPATIENT PHYSICAL THERAPY TREATMENT  Progress Note  Reporting Period 03/16/2024 to 04/11/2024   See note below for Objective Data and Assessment of Progress/Goals.     Patient Name: Tina Hinton MRN: [id number] DOB:[birth year], 68 y.o., female Today's Date: 04/11/2024   END OF SESSION:  PT End of Session - 04/11/24 0805     Visit Number 7    Date for Recertification  05/11/24    Authorization Type Federal BCBS    PT Start Time 0805    PT Stop Time [id number]    PT Time Calculation (min) 42 min    Activity Tolerance Patient tolerated treatment well    Behavior During Therapy [facility] for tasks assessed/performed               Past Medical History:  Diagnosis Date   Ankle fracture    Right   Anxiety    Arthritis    hands, knees   Asthma    Bilateral knee pain    Cancer (HCC)    skin cancer   Chest pain    Chicken pox    Clavicle fracture    Left   Complication of anesthesia    Depression    Dysrhythmia    Foot fracture, left    GERD (gastroesophageal reflux disease)    History of hiatal hernia    Hyperlipidemia    IBS (irritable bowel syndrome) 01/19/2017   Joint pain    Low back pain    Osteoarthritis    Other fatigue    Palpitations    Panic attacks    Seasonal allergies    Shortness of breath on exertion    SVT (supraventricular tachycardia)    Swallowing difficulty    Uterine polyp    ablation   Past Surgical History:  Procedure Laterality Date   BIOSPY     HEAD   CESAREAN SECTION     COLONOSCOPY     ENDOMETRIAL ABLATION     MANDIBLE SURGERY     TONSILLECTOMY     TOTAL HIP ARTHROPLASTY Left 02/26/2024   Procedure: ARTHROPLASTY, HIP, TOTAL, ANTERIOR APPROACH;  Surgeon: Vernetta Lonni GRADE, MD;  Location: [facility];  Service: Orthopedics;  Laterality: Left;   TUBAL LIGATION     UPPER GASTROINTESTINAL ENDOSCOPY     Patient Active Problem List   Diagnosis Date Noted   Status post total replacement of left hip 02/26/2024   Claustrophobia  10/15/2023   Vitamin D  deficiency 06/25/2021   Class 3 severe obesity with serious comorbidity and body mass index (BMI) of 45.0 to 49.9 in adult (HCC) 03/12/2021   Prediabetes 03/12/2021   Lumbar strain, subsequent encounter 02/14/2021   Asthma    Vertigo 01/31/2020   Aortic atherosclerosis 02/15/2018   IBS (irritable bowel syndrome) - D 01/19/2017   Primary osteoarthritis of both knees 01/19/2017   Preventative health care 07/30/2015   Obesity, unspecified 07/30/2015   Anxiety 06/25/2015   Hyperlipidemia LDL goal <100 06/25/2015   Sprain of medial collateral ligament of left knee 06/25/2015   Osteoarthritis of both knees 06/25/2015   Degeneration of intervertebral disc of lumbar region 01/04/2015   Spondylolisthesis at L5-S1 level 01/04/2015   Fracture of lateral malleolus 11/14/2014   Foot, fracture, navicular 11/14/2014    PCP: Antonio Cyndee Jamee JONELLE, DO   REFERRING PROVIDER: Vernetta Lonni GRADE, MD  REFERRING DIAG: [phone] (ICD-10-CM) - Status post total replacement of left hip   THERAPY DIAG:  Muscle weakness (generalized)  Pain in left hip  Unsteadiness on feet  Other abnormalities of gait and mobility  RATIONALE FOR EVALUATION AND TREATMENT: Rehabilitation  ONSET DATE: 02/26/2024 - L THA (anterior approach)  NEXT MD VISIT: 10/06/2024   SUBJECTIVE:                                                                                                                                                                                                         SUBJECTIVE STATEMENT: Patient reports her f/u with the MD went well and he gave her orders for aquatic PT.  She reports she was able to walk almost 6000 steps yesterday.  EVAL: Patient is ~2.5 weeks status post L THA via anterior approach on 02/26/2024.  She initially had 2 weeks of HH PT which was completed on 03/11/24.  She continues to experience what she feels is bone pain/discomfort in the L hip but nothing like than  pain she had before surgery.  Also notes numbness along the surgical incision.  Still using the 4WW full time due to fear of falling - she states she has been using the 4WW or cane for the past 9 months due to limited standing/walking tolerance and fear of falling.  She would like to eventually try to wean from all ADs.  Sleeping has been difficult as she normally sleeps on her side and cannot comfortably right now - sleeping on her back makes her back hurt.  She thinks she may have a leg length discrepancy following the THA surgery.  PAIN: Are you having pain? No and Yes: NPRS scale: 0/10   Pain location: L hip/buttock   PERTINENT HISTORY:  Anxiety, OA, asthma, depression, DOE, GERD, HLD, IBS, LBP, obesity, SVT and vertigo.   PRECAUTIONS: Anterior hip  RED FLAGS: None  WEIGHT BEARING RESTRICTIONS: No  FALLS:  Has patient fallen in last 6 months? Yes. Number of falls 1   LIVING ENVIRONMENT: Lives with: lives with their family Lives in: House Stairs: Yes: Internal: to basement - 12 steps; on right going up, on left going up, and can reach both and External: 2+1 steps; on left going up Has following equipment at home: Single point cane, Walker - 2 wheeled, Environmental Consultant - 4 wheeled, and shower chair  OCCUPATION: Works from home  PLOF: Independent and Leisure: gardening (not this year but wants to get back to that next year), [employer] water exercises   PATIENT GOALS: To walk w/o a walker or cane with a steady gait.   OBJECTIVE: (objective measures completed at initial evaluation unless otherwise dated)  DIAGNOSTIC FINDINGS:  02/26/24 -  DG Pelvis IMPRESSION: 1. Left hip arthroplasty without immediate postoperative complication.  PATIENT SURVEYS:  ABC scale: The Activities-Specific Balance Confidence (ABC) Scale 0% 10 20 30  40 50 60 70 80 90 100% No confidence<->completely confident  "How confident are you that you will not lose your balance or become unsteady when you . . .  Date  tested: 03/16/2024  04/06/2024   Walk around the house 70%   2. Walk up or down stairs 50%   3. Bend over and pick up a slipper from in front of a closet floor 30%   4. Reach for a small can off a shelf at eye level 50%   5. Stand on tip toes and reach for something above your head 30%   6. Stand on a chair and reach for something 0%   7. Sweep the floor 40%   8. Walk outside the house to a car parked in the driveway 40%   9. Get into or out of a car 40%   10. Walk across a parking lot to the mall 30%   11. Walk up or down a ramp 60%   12. Walk in a crowded mall where people rapidly walk past you 30%   13. Are bumped into by people as you walk through the mall 30%   14. Step onto or off of an escalator while you are holding onto the railing 20%   15. Step onto or off an escalator while holding onto parcels such that you cannot hold onto the railing 0%   16. Walk outside on icy sidewalks 0%   Total: #/16 520 / 1600 = 32.5 % 1260 / 1600 = 78.8 %  Level of physical functioning:  Low Moderate    LEFS  Extreme difficulty/unable (0), Quite a bit of difficulty (1), Moderate difficulty (2), Little difficulty (3), No difficulty (4) Survey date:  03/16/2024  04/06/2024   Any of your usual work, housework or school activities 2 3  2. Usual hobbies, recreational or sporting activities 3 3  3. Getting into/out of the bath 1 4  4. Walking between rooms 4 4  5. Putting on socks/shoes 2 4  6. Squatting  0 3  7. Lifting an object, like a bag of groceries from the floor 0 4  8. Performing light activities around your home 2 4  9. Performing heavy activities around your home 0 3  10. Getting into/out of a car 2 4  11. Walking 2 blocks 0 4  12. Walking 1 mile 0 1  13. Going up/down 10 stairs (1 flight) 2 4  14. Standing for 1 hour 0 0  15.  sitting for 1 hour 2 0  16. Running on even ground 0 0  17. Running on uneven ground 0 0  18. Making sharp turns while running fast 0 0  19. Hopping  0 0  20.  Rolling over in bed 1 3  Score total:   21 / 80 = 26.3 % 48 / 80 = 60.0 %  Functional Limitation:  Moderate Mild to moderate    COGNITION: Overall cognitive status: Within functional limits for tasks assessed    SENSATION: WFL Except numbness along surgical incision  POSTURE:  Apparent ~1 cm leg length discrepancy (L longer than R) with L iliac crest sitting higher  MUSCLE LENGTH: (TBA as indicated) Hamstrings:  ITB:  Piriformis:  Hip flexors:  Quads:  Heelcord:   LOWER EXTREMITY ROM:  Active ROM Right eval  Left eval  Hip flexion    Hip extension    Hip abduction    Hip adduction    Hip internal rotation    Hip external rotation    Knee flexion    Knee extension    Ankle dorsiflexion    Ankle plantarflexion    Ankle inversion    Ankle eversion    (Blank rows = not tested)  LOWER EXTREMITY MMT: (all testing in sitting on eval)  MMT Right eval Left eval R 04/06/24 L 04/06/24  Hip flexion 4+ 3- 4+ 4  Hip extension 4- 3+ 4- 4-  Hip abduction 4 3+ 4 4-  Hip adduction 4 3+ 4 4  Hip internal rotation 4 2+ 4+ 4-  Hip external rotation 4 3+ 4+ 4-  Knee flexion 5 5 5 5   Knee extension 5 4 5 5   Ankle dorsiflexion 4- 4- 4+ 4+  Ankle plantarflexion      Ankle inversion      Ankle eversion       (Blank rows = not tested)  FUNCTIONAL TESTS:  5 times sit to stand: 18.97 sec Timed up and go (TUG): 17.69 with 4WW; 15.66 sec w/o AD 10 meter walk test: 13.60 sec with 4WW; 16.00 sec w/o AD Berg Balance Scale: 03/18/24 - 47/56, indicating moderate (>50%) risk for falls FGA: 03/18/24 - 14/30, indicating high risk for falls  GAIT: Distance walked: clinic distances Assistive device utilized: Environmental Consultant - 4 wheeled and None Level of assistance: Modified independence and SBA Gait pattern: step through pattern, lateral hip instability with L lateral lean to compensate for R hip drop/Trendelenburg     TODAY'S TREATMENT:   04/11/2024  THERAPEUTIC EXERCISE: To improve strength,  endurance, and ROM.  Demonstration, verbal and tactile cues throughout for technique.  Rec Bike - L2 x 6' S/L L reverse clam x 5, 2# x 10 S/L L clam attempted but deferred d/t increased L hip pain Hooklying L unilateral bent knee fallout (hip ABD/ER) 2 x 10 Seated B RTB clam x 10 Seated L hip flexion & ER x 10 Standing L hip ER isometric into wall 10 x 3   04/06/2024  THERAPEUTIC EXERCISE: To improve strength, endurance, and ROM.    NuStep - L5 x 6'  THERAPEUTIC ACTIVITIES: To improve functional performance.  Demonstration, verbal and tactile cues throughout for technique. LE MMT - see above MMT table LEFS: 48 / 80 = 60.0 % ABC scale: 1260 / 1600 = 78.8 % TRX lateral lunge x 10 bil  PHYSICAL PERFORMANCE TEST or MEASUREMENT: Functional Gait  Assessment  Gait Level Surface Walks 20 ft in less than 7 sec but greater than 5.5 sec, uses assistive device, slower speed, mild gait deviations, or deviates 6-10 in outside of the 12 in walkway width.   Change in Gait Speed Able to smoothly change walking speed without loss of balance or gait deviation. Deviate no more than 6 in outside of the 12 in walkway width.   Gait with Horizontal Head Turns Performs head turns smoothly with no change in gait. Deviates no more than 6 in outside 12 in walkway width   Gait with Vertical Head Turns Performs head turns with no change in gait. Deviates no more than 6 in outside 12 in walkway width.   Gait and Pivot Turn Pivot turns safely within 3 sec and stops quickly with no loss of balance.   Step Over Obstacle Is able to step over one shoe box (4.5 in total  height) without changing gait speed. No evidence of imbalance.   Gait with Narrow Base of Support Ambulates 4-7 steps.   Gait with Eyes Closed Walks 20 ft, slow speed, abnormal gait pattern, evidence for imbalance, deviates 10-15 in outside 12 in walkway width. Requires more than 9 sec to ambulate 20 ft.   Ambulating Backwards Walks 20 ft, uses assistive  device, slower speed, mild gait deviations, deviates 6-10 in outside 12 in walkway width.   Steps Alternating feet, must use rail.   Total Score 22   FGA comment: 19-24 = medium risk fall       SELF CARE:  Provided education on post-op incisional scar tissue mobilization as well as retrograde massage to help with restoration of normal sensation and reduced edema.   04/04/2024  THERAPEUTIC EXERCISE: To improve strength and endurance.    Rec Bike - L2 x 7'  THERAPEUTIC ACTIVITIES: To improve functional performance.  Demonstration, verbal and tactile cues throughout for technique. 5xSTS = 12.53 sec w/o UE assist TUG = 9.62 sec w/o AD TRX squat 2 x 10  NEUROMUSCULAR RE-EDUCATION: To improve balance, coordination, kinesthesia, proprioception, and reduce fall risk. Fitter hip ABD (1 black, 1 blue) 2 x 10 bil Fitter hip extension (1 black, 1 blue) 2 x 10 bil, intermittent UE support on back of chair L/R SLS + 5-way step/toe tap to colored dots x 1 cycle,  L/R SLS + 6-way (fwd cross-over) step/toe tap to colored dots x 2 cycles L/R SLS + 5-way slides to colored dots x 5 cycles   03/31/2024  THERAPEUTIC EXERCISE: To improve strength and endurance.  Demonstration, verbal and tactile cues throughout for technique.  Rec Bike - L2 x 7' SLS on 2 step + opp LE hip hike x 5 bil  NEUROMUSCULAR RE-EDUCATION: To improve balance, coordination, kinesthesia, posture, proprioception, and reduce fall risk. L SLS + 5-way step/toe tap to colored dots x 5 cycles L SLS + 5-way slides to colored dots x 5 cycles L/R SLS + opp LE RTB 4-way SLR x 10, intermittent UE support on back of chair B side-stepping on balance beam x 4 passes - occasional   03/28/24 THERAPEUTIC EXERCISE: To improve endurance and ROM.  Demonstration, verbal and tactile cues throughout for technique. NuStep L4 x 6'  Foam stand:  EO x 1'   EC x 1'  toe/heel raises x 20  squats x 2/10   turning 360 deg x 2 each direction lateral  step ups to foam x 10 LLE Medium swiss ball bouncing EO x 1'; EC x 1';  alternating knee ext x 10 BLE SLS at counter on LLE Seated ankle inversion and eversion w/ YTB x 2/10 BLE   03/18/2024 PHYSICAL PERFORMANCE TEST or MEASUREMENT:  Berg Balance Test   Sit to Stand Able to stand without using hands and stabilize independently    Standing Unsupported Able to stand safely 2 minutes    Sitting with Back Unsupported but Feet Supported on Floor or Stool Able to sit safely and securely 2 minutes    Stand to Sit Sits safely with minimal use of hands    Transfers Able to transfer safely, minor use of hands    Standing Unsupported with Eyes Closed Able to stand 10 seconds safely    Standing Unsupported with Feet Together Able to place feet together independently and stand 1 minute safely    From Standing, Reach Forward with Outstretched Arm Can reach forward >12 cm safely (5)  From Standing Position, Pick up Object from Floor Able to pick up shoe, needs supervision    From Standing Position, Turn to Look Behind Over each Shoulder Looks behind from both sides and weight shifts well    Turn 360 Degrees Able to turn 360 degrees safely but slowly    Standing Unsupported, Alternately Place Feet on Step/Stool Able to stand independently and complete 8 steps >20 seconds    Standing Unsupported, One Foot in Front Able to place foot ahead of the other independently and hold 30 seconds    Standing on One Leg Tries to lift leg/unable to hold 3 seconds but remains standing independently    Total Score 47    Berg comment: 46-51 = Moderate (>50%) fall risk      Functional Gait  Assessment   Gait Level Surface Walks 20 ft, slow speed, abnormal gait pattern, evidence for imbalance or deviates 10-15 in outside of the 12 in walkway width. Requires more than 7 sec to ambulate 20 ft.    Change in Gait Speed Able to change speed, demonstrates mild gait deviations, deviates 6-10 in outside of the 12 in walkway  width, or no gait deviations, unable to achieve a major change in velocity, or uses a change in velocity, or uses an assistive device.    Gait with Horizontal Head Turns Performs head turns smoothly with slight change in gait velocity (eg, minor disruption to smooth gait path), deviates 6-10 in outside 12 in walkway width, or uses an assistive device.    Gait with Vertical Head Turns Performs head turns with no change in gait. Deviates no more than 6 in outside 12 in walkway width.    Gait and Pivot Turn Pivot turns safely in greater than 3 sec and stops with no loss of balance, or pivot turns safely within 3 sec and stops with mild imbalance, requires small steps to catch balance.    Step Over Obstacle Is able to step over one shoe box (4.5 in total height) but must slow down and adjust steps to clear box safely. May require verbal cueing.    Gait with Narrow Base of Support Ambulates less than 4 steps heel to toe or cannot perform without assistance.    Gait with Eyes Closed Walks 20 ft, slow speed, abnormal gait pattern, evidence for imbalance, deviates 10-15 in outside 12 in walkway width. Requires more than 9 sec to ambulate 20 ft.    Ambulating Backwards Walks 20 ft, slow speed, abnormal gait pattern, evidence for imbalance, deviates 10-15 in outside 12 in walkway width.    Steps Two feet to a stair, must use rail.    Total Score 14    FGA comment: < 19 = high risk fall      THERAPEUTIC EXERCISE: To improve strength, endurance, ROM, and flexibility.  Demonstration, verbal and tactile cues throughout for technique.  NuStep - L3 x 6 min (UE/LE) Standing hip ABD x 10 bil, UE support on back of chair for balance  Standing hip extension x 10 bil, UE support on back of chair for balance  Standing hip flexion march 2# x 10 bil, UE support on back of chair for balance  Standing heel & toe raises x 20   03/16/2024  SELF CARE:  Reviewed eval findings and role of PT in addressing identified deficits  as well as review of HH PT HEP:  Supine heel slide Supine hip ABD/ADD Hookying SLR Hooklying SAQ Standing hip ABD Standing hip extension  Standing heel raises - instructed pt to add alternating toe raises   PATIENT EDUCATION:  Education details: standardized testing results and interpretation, progress with PT, and continue with current HEP  Person educated: Patient Education method: Explanation Education comprehension: verbalized understanding  HOME EXERCISE PROGRAM: Access Code: [email or web address] URL: [email or web address] Date: 04/11/2024 Prepared by: Elijah Hidden  Exercises - Standing Hip Flexion with Anchored Resistance and Chair Support  - 1 x daily - 3 x weekly - 2 sets - 10 reps - 3 sec hold - Standing Hip Adduction with Anchored Resistance  - 1 x daily - 3 x weekly - 2 sets - 10 reps - 3 sec hold - Standing Hip Extension with Anchored Resistance  - 1 x daily - 3 x weekly - 2 sets - 10 reps - 3 sec hold - Standing Hip Abduction with Anchored Resistance  - 1 x daily - 3 x weekly - 2 sets - 10 reps - 3 sec hold - Hip Hiking on Step  - 1 x daily - 3 x weekly - 2 sets - 10 reps - 3 sec hold - Sidelying Reverse Clamshell  - 1 x daily - 3 x weekly - 2 sets - 10 reps - 3 sec hold - Hooklying Isometric Clamshell (Mirrored)  - 1 x daily - 3 x weekly - 2 sets - 10 reps - 3 sec hold - Seated Hip Abduction/External Rotation With Resistance at Thighs  - 1 x daily - 3 x weekly - 2 sets - 10 reps - 3 sec hold - Standing Hip External Rotation at Wall  - 1 x daily - 3 x weekly - 2 sets - 10 reps - 3 sec hold  HH PT HEP + 2# ankle cuff weights Supine heel slide Supine hip ABD/ADD Hookying SLR Hooklying SAQ Standing hip ABD Standing hip extension Standing heel raises - instructed pt to add alternating toe raises   ASSESSMENT:  CLINICAL IMPRESSION: Quintana Canelo reports the surgeon was pleased with her progress and gave her clearance to resume aquatic PT to learn what exercises  would be appropriate for her hip in the pool.  As there is a delay before any aquatic appointments are available, we decided to continue land-based PT 1x/wk to continue to address ongoing weakness identified on MMT last visit, especially hip IR and ER which was the focus today.  Pt unable to perform side-lying ER clam w/o increased pain, therefore provided alternative options in hooklying, sitting and standing which were better tolerated - HEP updated accordingly.  Mar will benefit from continued skilled PT to address ongoing ROM, coordination, strength and balance deficits to improve mobility and activity tolerance with decreased pain interference and decreased risk for falls.    EVAL: Lillian Ballester is a 68 y.o. female who was referred to physical therapy for evaluation and treatment s/p L hip THA via anterior approach on 02/26/2024.  Patient reports continued mild L hip pain since surgery, but not as intense as before surgery.  Pain is worse when she is working on her [facility] PT exercises.  Patient has deficits in L hip ROM, B LE flexibility, BLE strength, abnormal posture with potential leg length discrepancy, and altered gait pattern which are interfering with ADLs and are impacting quality of life.  On LEFS patient scored 21/80 demonstrating moderate functional limitation.  She expresses a significant concern regarding fear of falling, with ABC scale revealing only 32.5% balance confidence, indicative of a low level of physical functioning, therefore we will plan  for further standardized balance testing next visit.  Milea Klink will benefit from skilled PT to address above deficits to improve mobility and activity tolerance with decreased pain interference and decreased risk for falls.  OBJECTIVE IMPAIRMENTS: Abnormal gait, decreased activity tolerance, decreased balance, decreased coordination, decreased knowledge of condition, decreased knowledge of use of DME, decreased mobility, difficulty walking,  decreased ROM, decreased strength, increased fascial restrictions, impaired perceived functional ability, increased muscle spasms, impaired flexibility, impaired sensation, improper body mechanics, postural dysfunction, and pain.   ACTIVITY LIMITATIONS: carrying, lifting, bending, sitting, standing, squatting, sleeping, stairs, transfers, bed mobility, bathing, dressing, and locomotion level  PARTICIPATION LIMITATIONS: meal prep, cleaning, laundry, driving, shopping, community activity, occupation, and yard work  PERSONAL FACTORS: Fitness, Past/current experiences, Profession, Time since onset of injury/illness/exacerbation, and 3+ comorbidities: Anxiety, OA, asthma, depression, DOE, GERD, HLD, IBS, LBP, obesity, SVT and vertigo are also affecting patient's functional outcome.   REHAB POTENTIAL: Good  CLINICAL DECISION MAKING: Evolving/moderate complexity  EVALUATION COMPLEXITY: Moderate   GOALS: Goals reviewed with patient? Yes  SHORT TERM GOALS: Target date: 04/13/2024  Patient will be independent with initial HEP. Baseline: Reviewed HH PT HEP on eval 03/18/24 - guidance provided on how to add resistance with current HH PT HEP using 2# ankle cuff weights Goal status: MET - 04/04/24  2.  Patient will report at least 25% improvement in L hip pain to improve QOL. Baseline: 1-2/10 Goal status: MET  3.  Complete standardized balance testing and update LTG's as indicated Baseline: Berg and FGA vs DGI pending Goal status: MET - 03/18/24  4.  Patient will improve 5xSTS time to </= 15 seconds to demonstrate improved functional strength and transfer efficiency  Baseline: 18.97 sec Goal status: MET - 04/04/24 - 12.53 sec w/o UE assist  5.  Patient will demonstrate decreased TUG time to </= 13.5 sec to decrease risk for falls with transitional mobility Baseline: 17.69 with 4WW; 15.66 sec w/o AD Goal status: MET - 04/04/24 - 9.62 sec w/o AD  LONG TERM GOALS: Target date:  05/11/2024  Patient will be independent with advanced/ongoing HEP to improve outcomes and carryover.  Baseline:  Goal status: IN PROGRESS - 04/06/24 - met for current HEP  2.  Patient will report at least 50-75% improvement in L hip pain to improve QOL. Baseline: 1-2/10 Goal status: MET - 04/06/24 - Pt reports 100% improvement in pain, still has a little discomfort at the surgical incision  3.  Patient will demonstrate improved B LE strength to >/= 4 to 4+/5 for improved stability and ease of mobility. Baseline: Refer to above LE MMT table Goal status: IN PROGRESS - 04/06/24 - overall improvement in B LE  4.  Patient will be able to ambulate 600' with or w/o LRAD and normal gait pattern without increased pain to access community.  Baseline: Currently reliant on 4WW for almost all ambulation - limited distances 03/18/24 - Pt using SPC for ambulation today Goal status: IN PROGRESS - 04/11/24 - Pt ambulating w/o AD most of the time now with decreasing gait deviations although still slight lateral sway  5.  Patient will report >/= 40/80 on LEFS (MCID = 9 pts) to demonstrate improved functional ability. Baseline:  21 / 80 = 26.3 % Goal status: MET - 04/06/24 - 48 / 80 = 60.0 %  6.  Patient will report >/= 50% average balance confidence on ABC to demonstrate at least moderate level of physical functioning and decreased risk for falls. Baseline: 520 / 1600 =  32.5 %, low level of physical functioning Goal status: MET - 04/06/24 - 1260 / 1600 = 78.8 %  7.  Patient will demonstrate at least 19/24 on DGI or 19/30 on FGA to decrease risk of falls. Baseline: 03/18/24 - FGA = 14/30 Goal status: MET - 04/06/24 - FGA = 22/30     PLAN:  PT FREQUENCY: 2x/week  PT DURATION: 8 weeks  PLANNED INTERVENTIONS: 97164- PT Re-evaluation, 97750- Physical Performance Testing, 97110-Therapeutic exercises, 97530- Therapeutic activity, 97112- Neuromuscular re-education, 97535- Self Care, 02859- Manual therapy,  [phone]- Gait training, [phone]- Aquatic Therapy, [phone]- Electrical stimulation (unattended), [phone]- Ultrasound, 02966- Ionotophoresis 4mg /ml Dexamethasone, 79439 (1-2 muscles), 20561 (3+ muscles)- Dry Needling, Patient/Family education, Balance training, Stair training, Taping, Joint mobilization, Scar mobilization, DME instructions, Cryotherapy, and Moist heat  PLAN FOR NEXT SESSION: Proximal LE strengthening, increasing unilateral activities - esp hip ER/IR, extension and ABD; continue to add balance activities; re-evaluate potential leg length discrepancy at pt request   Elijah CHRISTELLA Hidden, PT 04/11/2024, 8:48 AM

## 2024-04-14 ENCOUNTER — Encounter

## 2024-04-18 ENCOUNTER — Ambulatory Visit

## 2024-04-18 DIAGNOSIS — M25552 Pain in left hip: Secondary | ICD-10-CM

## 2024-04-18 DIAGNOSIS — R2681 Unsteadiness on feet: Secondary | ICD-10-CM

## 2024-04-18 DIAGNOSIS — M6281 Muscle weakness (generalized): Secondary | ICD-10-CM | POA: Diagnosis not present

## 2024-04-18 DIAGNOSIS — R2689 Other abnormalities of gait and mobility: Secondary | ICD-10-CM

## 2024-04-18 NOTE — Therapy (Signed)
 OUTPATIENT PHYSICAL THERAPY TREATMENT    Patient Name: Tina Hinton MRN: [id number] DOB:02-24-1956, 68 y.o., female Today's Date: 04/18/2024   END OF SESSION:  PT End of Session - 04/18/24 0932     Visit Number 8    Date for Recertification  05/11/24    Authorization Type Federal [employer]    PT Start Time [phone]    PT Stop Time [phone]    PT Time Calculation (min) 44 min    Activity Tolerance Patient tolerated treatment well    Behavior During Therapy [facility] for tasks assessed/performed                Past Medical History:  Diagnosis Date   Ankle fracture    Right   Anxiety    Arthritis    hands, knees   Asthma    Bilateral knee pain    Cancer (HCC)    skin cancer   Chest pain    Chicken pox    Clavicle fracture    Left   Complication of anesthesia    Depression    Dysrhythmia    Foot fracture, left    GERD (gastroesophageal reflux disease)    History of hiatal hernia    Hyperlipidemia    IBS (irritable bowel syndrome) 01/19/2017   Joint pain    Low back pain    Osteoarthritis    Other fatigue    Palpitations    Panic attacks    Seasonal allergies    Shortness of breath on exertion    SVT (supraventricular tachycardia)    Swallowing difficulty    Uterine polyp    ablation   Past Surgical History:  Procedure Laterality Date   BIOSPY     HEAD   CESAREAN SECTION     COLONOSCOPY     ENDOMETRIAL ABLATION     MANDIBLE SURGERY     TONSILLECTOMY     TOTAL HIP ARTHROPLASTY Left 02/26/2024   Procedure: ARTHROPLASTY, HIP, TOTAL, ANTERIOR APPROACH;  Surgeon: Vernetta Lonni GRADE, MD;  Location: [facility];  Service: Orthopedics;  Laterality: Left;   TUBAL LIGATION     UPPER GASTROINTESTINAL ENDOSCOPY     Patient Active Problem List   Diagnosis Date Noted   Status post total replacement of left hip 02/26/2024   Claustrophobia 10/15/2023   Vitamin D  deficiency 06/25/2021   Class 3 severe obesity with serious comorbidity and body mass index (BMI) of  45.0 to 49.9 in adult (HCC) 03/12/2021   Prediabetes 03/12/2021   Lumbar strain, subsequent encounter 02/14/2021   Asthma    Vertigo 01/31/2020   Aortic atherosclerosis 02/15/2018   IBS (irritable bowel syndrome) - D 01/19/2017   Primary osteoarthritis of both knees 01/19/2017   Preventative health care 07/30/2015   Obesity, unspecified 07/30/2015   Anxiety 06/25/2015   Hyperlipidemia LDL goal <100 06/25/2015   Sprain of medial collateral ligament of left knee 06/25/2015   Osteoarthritis of both knees 06/25/2015   Degeneration of intervertebral disc of lumbar region 01/04/2015   Spondylolisthesis at L5-S1 level 01/04/2015   Fracture of lateral malleolus 11/14/2014   Foot, fracture, navicular 11/14/2014    PCP: Antonio Cyndee Jamee JONELLE, DO   REFERRING PROVIDER: Vernetta Lonni GRADE, MD  REFERRING DIAG: [phone] (ICD-10-CM) - Status post total replacement of left hip   THERAPY DIAG:  Muscle weakness (generalized)  Pain in left hip  Unsteadiness on feet  Other abnormalities of gait and mobility  RATIONALE FOR EVALUATION AND TREATMENT: Rehabilitation  ONSET DATE: 02/26/2024 -  L THA (anterior approach)  NEXT MD VISIT: 10/06/2024   SUBJECTIVE:                                                                                                                                                                                                         SUBJECTIVE STATEMENT: Increased pain today L knee and hip, reports she overdid things this weekend (went OOT, cleaned house)  EVAL: Patient is ~2.5 weeks status post L THA via anterior approach on 02/26/2024.  She initially had 2 weeks of HH PT which was completed on 03/11/24.  She continues to experience what she feels is bone pain/discomfort in the L hip but nothing like than pain she had before surgery.  Also notes numbness along the surgical incision.  Still using the 4WW full time due to fear of falling - she states she has been using the  4WW or cane for the past 9 months due to limited standing/walking tolerance and fear of falling.  She would like to eventually try to wean from all ADs.  Sleeping has been difficult as she normally sleeps on her side and cannot comfortably right now - sleeping on her back makes her back hurt.  She thinks she may have a leg length discrepancy following the THA surgery.  PAIN: Are you having pain? No and Yes: NPRS scale: 5/10   Pain location: L hip/buttock   PERTINENT HISTORY:  Anxiety, OA, asthma, depression, DOE, GERD, HLD, IBS, LBP, obesity, SVT and vertigo.   PRECAUTIONS: Anterior hip  RED FLAGS: None  WEIGHT BEARING RESTRICTIONS: No  FALLS:  Has patient fallen in last 6 months? Yes. Number of falls 1   LIVING ENVIRONMENT: Lives with: lives with their family Lives in: House Stairs: Yes: Internal: to basement - 12 steps; on right going up, on left going up, and can reach both and External: 2+1 steps; on left going up Has following equipment at home: Single point cane, Walker - 2 wheeled, Environmental Consultant - 4 wheeled, and shower chair  OCCUPATION: Works from home  PLOF: Independent and Leisure: gardening (not this year but wants to get back to that next year), [employer] water exercises   PATIENT GOALS: To walk w/o a walker or cane with a steady gait.   OBJECTIVE: (objective measures completed at initial evaluation unless otherwise dated)  DIAGNOSTIC FINDINGS:  02/26/24 - DG Pelvis IMPRESSION: 1. Left hip arthroplasty without immediate postoperative complication.  PATIENT SURVEYS:  ABC scale: The Activities-Specific Balance Confidence (ABC) Scale 0% 10 20 30  40 50 60 70 80 90 100%  No confidence<->completely confident  "How confident are you that you will not lose your balance or become unsteady when you . . .  Date tested: 03/16/2024  04/06/2024   Walk around the house 70%   2. Walk up or down stairs 50%   3. Bend over and pick up a slipper from in front of a closet floor 30%   4.  Reach for a small can off a shelf at eye level 50%   5. Stand on tip toes and reach for something above your head 30%   6. Stand on a chair and reach for something 0%   7. Sweep the floor 40%   8. Walk outside the house to a car parked in the driveway 40%   9. Get into or out of a car 40%   10. Walk across a parking lot to the mall 30%   11. Walk up or down a ramp 60%   12. Walk in a crowded mall where people rapidly walk past you 30%   13. Are bumped into by people as you walk through the mall 30%   14. Step onto or off of an escalator while you are holding onto the railing 20%   15. Step onto or off an escalator while holding onto parcels such that you cannot hold onto the railing 0%   16. Walk outside on icy sidewalks 0%   Total: #/16 520 / 1600 = 32.5 % 1260 / 1600 = 78.8 %  Level of physical functioning:  Low Moderate    LEFS  Extreme difficulty/unable (0), Quite a bit of difficulty (1), Moderate difficulty (2), Little difficulty (3), No difficulty (4) Survey date:  03/16/2024  04/06/2024   Any of your usual work, housework or school activities 2 3  2. Usual hobbies, recreational or sporting activities 3 3  3. Getting into/out of the bath 1 4  4. Walking between rooms 4 4  5. Putting on socks/shoes 2 4  6. Squatting  0 3  7. Lifting an object, like a bag of groceries from the floor 0 4  8. Performing light activities around your home 2 4  9. Performing heavy activities around your home 0 3  10. Getting into/out of a car 2 4  11. Walking 2 blocks 0 4  12. Walking 1 mile 0 1  13. Going up/down 10 stairs (1 flight) 2 4  14. Standing for 1 hour 0 0  15.  sitting for 1 hour 2 0  16. Running on even ground 0 0  17. Running on uneven ground 0 0  18. Making sharp turns while running fast 0 0  19. Hopping  0 0  20. Rolling over in bed 1 3  Score total:   21 / 80 = 26.3 % 48 / 80 = 60.0 %  Functional Limitation:  Moderate Mild to moderate    COGNITION: Overall cognitive status:  Within functional limits for tasks assessed    SENSATION: WFL Except numbness along surgical incision  POSTURE:  Apparent ~1 cm leg length discrepancy (L longer than R) with L iliac crest sitting higher  MUSCLE LENGTH: (TBA as indicated) Hamstrings:  ITB:  Piriformis:  Hip flexors:  Quads:  Heelcord:   LOWER EXTREMITY ROM:  Active ROM Right eval Left eval  Hip flexion    Hip extension    Hip abduction    Hip adduction    Hip internal rotation    Hip external rotation  Knee flexion    Knee extension    Ankle dorsiflexion    Ankle plantarflexion    Ankle inversion    Ankle eversion    (Blank rows = not tested)  LOWER EXTREMITY MMT: (all testing in sitting on eval)  MMT Right eval Left eval R 04/06/24 L 04/06/24  Hip flexion 4+ 3- 4+ 4  Hip extension 4- 3+ 4- 4-  Hip abduction 4 3+ 4 4-  Hip adduction 4 3+ 4 4  Hip internal rotation 4 2+ 4+ 4-  Hip external rotation 4 3+ 4+ 4-  Knee flexion 5 5 5 5   Knee extension 5 4 5 5   Ankle dorsiflexion 4- 4- 4+ 4+  Ankle plantarflexion      Ankle inversion      Ankle eversion       (Blank rows = not tested)  FUNCTIONAL TESTS:  5 times sit to stand: 18.97 sec Timed up and go (TUG): 17.69 with 4WW; 15.66 sec w/o AD 10 meter walk test: 13.60 sec with 4WW; 16.00 sec w/o AD Berg Balance Scale: 03/18/24 - 47/56, indicating moderate (>50%) risk for falls FGA: 03/18/24 - 14/30, indicating high risk for falls  GAIT: Distance walked: clinic distances Assistive device utilized: Environmental Consultant - 4 wheeled and None Level of assistance: Modified independence and SBA Gait pattern: step through pattern, lateral hip instability with L lateral lean to compensate for R hip drop/Trendelenburg     TODAY'S TREATMENT:  04/18/24 THERAPEUTIC EXERCISE: To improve strength, endurance, and ROM.  Demonstration, verbal and tactile cues throughout for technique.  Nustep L4x8min Seated hip flexion RTB x 10 B Seated clams RTB x 10 B Hooklying L  unilateral bent knee fallout (hip ABD/ER) 2 x 10 Supine R hip abduction slide across table 2x10 Bridge peanut ball x 10 DKTC with peanut ball 2 x 10  MANUAL THERAPY: To promote normalized muscle tension, improve joint mobility and/or for pain modulation  IASTM with lacrosse ball to L lateral quads, ITB  hooklying  04/11/2024  THERAPEUTIC EXERCISE: To improve strength, endurance, and ROM.  Demonstration, verbal and tactile cues throughout for technique.  Rec Bike - L2 x 6' S/L L reverse clam x 5, 2# x 10 S/L L clam attempted but deferred d/t increased L hip pain Hooklying L unilateral bent knee fallout (hip ABD/ER) 2 x 10 Seated B RTB clam x 10 Seated L hip flexion & ER x 10 Standing L hip ER isometric into wall 10 x 3   04/06/2024  THERAPEUTIC EXERCISE: To improve strength, endurance, and ROM.    NuStep - L5 x 6'  THERAPEUTIC ACTIVITIES: To improve functional performance.  Demonstration, verbal and tactile cues throughout for technique. LE MMT - see above MMT table LEFS: 48 / 80 = 60.0 % ABC scale: 1260 / 1600 = 78.8 % TRX lateral lunge x 10 bil  PHYSICAL PERFORMANCE TEST or MEASUREMENT: Functional Gait  Assessment  Gait Level Surface Walks 20 ft in less than 7 sec but greater than 5.5 sec, uses assistive device, slower speed, mild gait deviations, or deviates 6-10 in outside of the 12 in walkway width.   Change in Gait Speed Able to smoothly change walking speed without loss of balance or gait deviation. Deviate no more than 6 in outside of the 12 in walkway width.   Gait with Horizontal Head Turns Performs head turns smoothly with no change in gait. Deviates no more than 6 in outside 12 in walkway width   Gait with Vertical  Head Turns Performs head turns with no change in gait. Deviates no more than 6 in outside 12 in walkway width.   Gait and Pivot Turn Pivot turns safely within 3 sec and stops quickly with no loss of balance.   Step Over Obstacle Is able to step over one shoe  box (4.5 in total height) without changing gait speed. No evidence of imbalance.   Gait with Narrow Base of Support Ambulates 4-7 steps.   Gait with Eyes Closed Walks 20 ft, slow speed, abnormal gait pattern, evidence for imbalance, deviates 10-15 in outside 12 in walkway width. Requires more than 9 sec to ambulate 20 ft.   Ambulating Backwards Walks 20 ft, uses assistive device, slower speed, mild gait deviations, deviates 6-10 in outside 12 in walkway width.   Steps Alternating feet, must use rail.   Total Score 22   FGA comment: 19-24 = medium risk fall       SELF CARE:  Provided education on post-op incisional scar tissue mobilization as well as retrograde massage to help with restoration of normal sensation and reduced edema.   04/04/2024  THERAPEUTIC EXERCISE: To improve strength and endurance.    Rec Bike - L2 x 7'  THERAPEUTIC ACTIVITIES: To improve functional performance.  Demonstration, verbal and tactile cues throughout for technique. 5xSTS = 12.53 sec w/o UE assist TUG = 9.62 sec w/o AD TRX squat 2 x 10  NEUROMUSCULAR RE-EDUCATION: To improve balance, coordination, kinesthesia, proprioception, and reduce fall risk. Fitter hip ABD (1 black, 1 blue) 2 x 10 bil Fitter hip extension (1 black, 1 blue) 2 x 10 bil, intermittent UE support on back of chair L/R SLS + 5-way step/toe tap to colored dots x 1 cycle,  L/R SLS + 6-way (fwd cross-over) step/toe tap to colored dots x 2 cycles L/R SLS + 5-way slides to colored dots x 5 cycles   03/31/2024  THERAPEUTIC EXERCISE: To improve strength and endurance.  Demonstration, verbal and tactile cues throughout for technique.  Rec Bike - L2 x 7' SLS on 2 step + opp LE hip hike x 5 bil  NEUROMUSCULAR RE-EDUCATION: To improve balance, coordination, kinesthesia, posture, proprioception, and reduce fall risk. L SLS + 5-way step/toe tap to colored dots x 5 cycles L SLS + 5-way slides to colored dots x 5 cycles L/R SLS + opp LE RTB 4-way  SLR x 10, intermittent UE support on back of chair B side-stepping on balance beam x 4 passes - occasional   03/28/24 THERAPEUTIC EXERCISE: To improve endurance and ROM.  Demonstration, verbal and tactile cues throughout for technique. NuStep L4 x 6'  Foam stand:  EO x 1'   EC x 1'  toe/heel raises x 20  squats x 2/10   turning 360 deg x 2 each direction lateral step ups to foam x 10 LLE Medium swiss ball bouncing EO x 1'; EC x 1';  alternating knee ext x 10 BLE SLS at counter on LLE Seated ankle inversion and eversion w/ YTB x 2/10 BLE   03/18/2024 PHYSICAL PERFORMANCE TEST or MEASUREMENT:  Berg Balance Test   Sit to Stand Able to stand without using hands and stabilize independently    Standing Unsupported Able to stand safely 2 minutes    Sitting with Back Unsupported but Feet Supported on Floor or Stool Able to sit safely and securely 2 minutes    Stand to Sit Sits safely with minimal use of hands    Transfers Able to transfer safely,  minor use of hands    Standing Unsupported with Eyes Closed Able to stand 10 seconds safely    Standing Unsupported with Feet Together Able to place feet together independently and stand 1 minute safely    From Standing, Reach Forward with Outstretched Arm Can reach forward >12 cm safely (5)    From Standing Position, Pick up Object from Floor Able to pick up shoe, needs supervision    From Standing Position, Turn to Look Behind Over each Shoulder Looks behind from both sides and weight shifts well    Turn 360 Degrees Able to turn 360 degrees safely but slowly    Standing Unsupported, Alternately Place Feet on Step/Stool Able to stand independently and complete 8 steps >20 seconds    Standing Unsupported, One Foot in Front Able to place foot ahead of the other independently and hold 30 seconds    Standing on One Leg Tries to lift leg/unable to hold 3 seconds but remains standing independently    Total Score 47    Berg comment: 46-51 = Moderate  (>50%) fall risk      Functional Gait  Assessment   Gait Level Surface Walks 20 ft, slow speed, abnormal gait pattern, evidence for imbalance or deviates 10-15 in outside of the 12 in walkway width. Requires more than 7 sec to ambulate 20 ft.    Change in Gait Speed Able to change speed, demonstrates mild gait deviations, deviates 6-10 in outside of the 12 in walkway width, or no gait deviations, unable to achieve a major change in velocity, or uses a change in velocity, or uses an assistive device.    Gait with Horizontal Head Turns Performs head turns smoothly with slight change in gait velocity (eg, minor disruption to smooth gait path), deviates 6-10 in outside 12 in walkway width, or uses an assistive device.    Gait with Vertical Head Turns Performs head turns with no change in gait. Deviates no more than 6 in outside 12 in walkway width.    Gait and Pivot Turn Pivot turns safely in greater than 3 sec and stops with no loss of balance, or pivot turns safely within 3 sec and stops with mild imbalance, requires small steps to catch balance.    Step Over Obstacle Is able to step over one shoe box (4.5 in total height) but must slow down and adjust steps to clear box safely. May require verbal cueing.    Gait with Narrow Base of Support Ambulates less than 4 steps heel to toe or cannot perform without assistance.    Gait with Eyes Closed Walks 20 ft, slow speed, abnormal gait pattern, evidence for imbalance, deviates 10-15 in outside 12 in walkway width. Requires more than 9 sec to ambulate 20 ft.    Ambulating Backwards Walks 20 ft, slow speed, abnormal gait pattern, evidence for imbalance, deviates 10-15 in outside 12 in walkway width.    Steps Two feet to a stair, must use rail.    Total Score 14    FGA comment: < 19 = high risk fall      THERAPEUTIC EXERCISE: To improve strength, endurance, ROM, and flexibility.  Demonstration, verbal and tactile cues throughout for technique.  NuStep - L3 x  6 min (UE/LE) Standing hip ABD x 10 bil, UE support on back of chair for balance  Standing hip extension x 10 bil, UE support on back of chair for balance  Standing hip flexion march 2# x 10 bil, UE support  on back of chair for balance  Standing heel & toe raises x 20   03/16/2024  SELF CARE:  Reviewed eval findings and role of PT in addressing identified deficits as well as review of HH PT HEP:  Supine heel slide Supine hip ABD/ADD Hookying SLR Hooklying SAQ Standing hip ABD Standing hip extension Standing heel raises - instructed pt to add alternating toe raises   PATIENT EDUCATION:  Education details: standardized testing results and interpretation, progress with PT, and continue with current HEP  Person educated: Patient Education method: Explanation Education comprehension: verbalized understanding  HOME EXERCISE PROGRAM: Access Code: [email or web address] URL: [email or web address] Date: 04/11/2024 Prepared by: Elijah Hidden  Exercises - Standing Hip Flexion with Anchored Resistance and Chair Support  - 1 x daily - 3 x weekly - 2 sets - 10 reps - 3 sec hold - Standing Hip Adduction with Anchored Resistance  - 1 x daily - 3 x weekly - 2 sets - 10 reps - 3 sec hold - Standing Hip Extension with Anchored Resistance  - 1 x daily - 3 x weekly - 2 sets - 10 reps - 3 sec hold - Standing Hip Abduction with Anchored Resistance  - 1 x daily - 3 x weekly - 2 sets - 10 reps - 3 sec hold - Hip Hiking on Step  - 1 x daily - 3 x weekly - 2 sets - 10 reps - 3 sec hold - Sidelying Reverse Clamshell  - 1 x daily - 3 x weekly - 2 sets - 10 reps - 3 sec hold - Hooklying Isometric Clamshell (Mirrored)  - 1 x daily - 3 x weekly - 2 sets - 10 reps - 3 sec hold - Seated Hip Abduction/External Rotation With Resistance at Thighs  - 1 x daily - 3 x weekly - 2 sets - 10 reps - 3 sec hold - Standing Hip External Rotation at Wall  - 1 x daily - 3 x weekly - 2 sets - 10 reps - 3 sec hold  HH PT HEP +  2# ankle cuff weights Supine heel slide Supine hip ABD/ADD Hookying SLR Hooklying SAQ Standing hip ABD Standing hip extension Standing heel raises - instructed pt to add alternating toe raises   ASSESSMENT:  CLINICAL IMPRESSION: Keyatta Tolles arrived with increased pain today from overdoing activity this past weekend. Kept exercises light and also worked on manual techniques to address pain levels. She had many TRP in the ITB and lateral quads. I tole her to scale down on the HEP for now, do the easier exercises until her pain gets better then can resume normal HEP.  Mar will benefit from continued skilled PT to address ongoing ROM, coordination, strength and balance deficits to improve mobility and activity tolerance with decreased pain interference and decreased risk for falls.    EVAL: Amar Keenum is a 68 y.o. female who was referred to physical therapy for evaluation and treatment s/p L hip THA via anterior approach on 02/26/2024.  Patient reports continued mild L hip pain since surgery, but not as intense as before surgery.  Pain is worse when she is working on her [facility] PT exercises.  Patient has deficits in L hip ROM, B LE flexibility, BLE strength, abnormal posture with potential leg length discrepancy, and altered gait pattern which are interfering with ADLs and are impacting quality of life.  On LEFS patient scored 21/80 demonstrating moderate functional limitation.  She expresses a significant concern regarding fear of falling, with  ABC scale revealing only 32.5% balance confidence, indicative of a low level of physical functioning, therefore we will plan for further standardized balance testing next visit.  Meili Kleckley will benefit from skilled PT to address above deficits to improve mobility and activity tolerance with decreased pain interference and decreased risk for falls.  OBJECTIVE IMPAIRMENTS: Abnormal gait, decreased activity tolerance, decreased balance, decreased  coordination, decreased knowledge of condition, decreased knowledge of use of DME, decreased mobility, difficulty walking, decreased ROM, decreased strength, increased fascial restrictions, impaired perceived functional ability, increased muscle spasms, impaired flexibility, impaired sensation, improper body mechanics, postural dysfunction, and pain.   ACTIVITY LIMITATIONS: carrying, lifting, bending, sitting, standing, squatting, sleeping, stairs, transfers, bed mobility, bathing, dressing, and locomotion level  PARTICIPATION LIMITATIONS: meal prep, cleaning, laundry, driving, shopping, community activity, occupation, and yard work  PERSONAL FACTORS: Fitness, Past/current experiences, Profession, Time since onset of injury/illness/exacerbation, and 3+ comorbidities: Anxiety, OA, asthma, depression, DOE, GERD, HLD, IBS, LBP, obesity, SVT and vertigo are also affecting patient's functional outcome.   REHAB POTENTIAL: Good  CLINICAL DECISION MAKING: Evolving/moderate complexity  EVALUATION COMPLEXITY: Moderate   GOALS: Goals reviewed with patient? Yes  SHORT TERM GOALS: Target date: 04/13/2024  Patient will be independent with initial HEP. Baseline: Reviewed HH PT HEP on eval 03/18/24 - guidance provided on how to add resistance with current HH PT HEP using 2# ankle cuff weights Goal status: MET - 04/04/24  2.  Patient will report at least 25% improvement in L hip pain to improve QOL. Baseline: 1-2/10 Goal status: MET  3.  Complete standardized balance testing and update LTG's as indicated Baseline: Berg and FGA vs DGI pending Goal status: MET - 03/18/24  4.  Patient will improve 5xSTS time to </= 15 seconds to demonstrate improved functional strength and transfer efficiency  Baseline: 18.97 sec Goal status: MET - 04/04/24 - 12.53 sec w/o UE assist  5.  Patient will demonstrate decreased TUG time to </= 13.5 sec to decrease risk for falls with transitional mobility Baseline: 17.69  with 4WW; 15.66 sec w/o AD Goal status: MET - 04/04/24 - 9.62 sec w/o AD  LONG TERM GOALS: Target date: 05/11/2024  Patient will be independent with advanced/ongoing HEP to improve outcomes and carryover.  Baseline:  Goal status: IN PROGRESS - 04/06/24 - met for current HEP  2.  Patient will report at least 50-75% improvement in L hip pain to improve QOL. Baseline: 1-2/10 Goal status: MET - 04/06/24 - Pt reports 100% improvement in pain, still has a little discomfort at the surgical incision  3.  Patient will demonstrate improved B LE strength to >/= 4 to 4+/5 for improved stability and ease of mobility. Baseline: Refer to above LE MMT table Goal status: IN PROGRESS - 04/06/24 - overall improvement in B LE  4.  Patient will be able to ambulate 600' with or w/o LRAD and normal gait pattern without increased pain to access community.  Baseline: Currently reliant on 4WW for almost all ambulation - limited distances 03/18/24 - Pt using SPC for ambulation today Goal status: IN PROGRESS - 04/11/24 - Pt ambulating w/o AD most of the time now with decreasing gait deviations although still slight lateral sway  5.  Patient will report >/= 40/80 on LEFS (MCID = 9 pts) to demonstrate improved functional ability. Baseline:  21 / 80 = 26.3 % Goal status: MET - 04/06/24 - 48 / 80 = 60.0 %  6.  Patient will report >/= 50% average balance confidence on  ABC to demonstrate at least moderate level of physical functioning and decreased risk for falls. Baseline: 520 / 1600 = 32.5 %, low level of physical functioning Goal status: MET - 04/06/24 - 1260 / 1600 = 78.8 %  7.  Patient will demonstrate at least 19/24 on DGI or 19/30 on FGA to decrease risk of falls. Baseline: 03/18/24 - FGA = 14/30 Goal status: MET - 04/06/24 - FGA = 22/30     PLAN:  PT FREQUENCY: 2x/week  PT DURATION: 8 weeks  PLANNED INTERVENTIONS: 02835- PT Re-evaluation, 97750- Physical Performance Testing, 97110-Therapeutic exercises,  97530- Therapeutic activity, 97112- Neuromuscular re-education, 97535- Self Care, 02859- Manual therapy, [phone]- Gait training, [phone]- Aquatic Therapy, [phone]- Electrical stimulation (unattended), [phone]- Ultrasound, 02966- Ionotophoresis 4mg /ml Dexamethasone, 79439 (1-2 muscles), 20561 (3+ muscles)- Dry Needling, Patient/Family education, Balance training, Stair training, Taping, Joint mobilization, Scar mobilization, DME instructions, Cryotherapy, and Moist heat  PLAN FOR NEXT SESSION: Assess pain/activity level from past week; Proximal LE strengthening, increasing unilateral activities - esp hip ER/IR, extension and ABD; continue to add balance activities; re-evaluate potential leg length discrepancy at pt request   Sol LITTIE Gaskins, PTA 04/18/2024, 9:34 AM

## 2024-04-25 ENCOUNTER — Ambulatory Visit: Admitting: Physical Therapy

## 2024-05-02 ENCOUNTER — Ambulatory Visit: Admitting: Physical Therapy

## 2024-05-10 ENCOUNTER — Encounter (HOSPITAL_BASED_OUTPATIENT_CLINIC_OR_DEPARTMENT_OTHER): Payer: Self-pay | Admitting: Physical Therapy

## 2024-05-10 ENCOUNTER — Ambulatory Visit (HOSPITAL_BASED_OUTPATIENT_CLINIC_OR_DEPARTMENT_OTHER): Attending: Orthopaedic Surgery | Admitting: Physical Therapy

## 2024-05-10 DIAGNOSIS — M25552 Pain in left hip: Secondary | ICD-10-CM | POA: Diagnosis present

## 2024-05-10 DIAGNOSIS — M6281 Muscle weakness (generalized): Secondary | ICD-10-CM | POA: Insufficient documentation

## 2024-05-10 NOTE — Therapy (Addendum)
 OUTPATIENT PHYSICAL THERAPY TREATMENT / DISCHARGE SUMMARY    Patient Name: Tina Hinton MRN: [id number] DOB:[birth year], 68 y.o., female Today's Date: 05/10/2024   END OF SESSION:  PT End of Session - 05/10/24 0851     Visit Number 9    Date for Recertification  05/11/24    Authorization Type Federal [employer]    PT Start Time 0800    PT Stop Time 0845    PT Time Calculation (min) 45 min    Activity Tolerance Patient tolerated treatment well    Behavior During Therapy WFL for tasks assessed/performed          Past Medical History:  Diagnosis Date   Ankle fracture    Right   Anxiety    Arthritis    hands, knees   Asthma    Bilateral knee pain    Cancer (HCC)    skin cancer   Chest pain    Chicken pox    Clavicle fracture    Left   Complication of anesthesia    Depression    Dysrhythmia    Foot fracture, left    GERD (gastroesophageal reflux disease)    History of hiatal hernia    Hyperlipidemia    IBS (irritable bowel syndrome) 01/19/2017   Joint pain    Low back pain    Osteoarthritis    Other fatigue    Palpitations    Panic attacks    Seasonal allergies    Shortness of breath on exertion    SVT (supraventricular tachycardia)    Swallowing difficulty    Uterine polyp    ablation   Past Surgical History:  Procedure Laterality Date   BIOSPY     HEAD   CESAREAN SECTION     COLONOSCOPY     ENDOMETRIAL ABLATION     MANDIBLE SURGERY     TONSILLECTOMY     TOTAL HIP ARTHROPLASTY Left 02/26/2024   Procedure: ARTHROPLASTY, HIP, TOTAL, ANTERIOR APPROACH;  Surgeon: Vernetta Lonni GRADE, MD;  Location: [facility];  Service: Orthopedics;  Laterality: Left;   TUBAL LIGATION     UPPER GASTROINTESTINAL ENDOSCOPY     Patient Active Problem List   Diagnosis Date Noted   Status post total replacement of left hip 02/26/2024   Claustrophobia 10/15/2023   Vitamin D  deficiency 06/25/2021   Class 3 severe obesity with serious comorbidity and body mass index (BMI)  of 45.0 to 49.9 in adult (HCC) 03/12/2021   Prediabetes 03/12/2021   Lumbar strain, subsequent encounter 02/14/2021   Asthma    Vertigo 01/31/2020   Aortic atherosclerosis 02/15/2018   IBS (irritable bowel syndrome) - D 01/19/2017   Primary osteoarthritis of both knees 01/19/2017   Preventative health care 07/30/2015   Obesity, unspecified 07/30/2015   Anxiety 06/25/2015   Hyperlipidemia LDL goal <100 06/25/2015   Sprain of medial collateral ligament of left knee 06/25/2015   Osteoarthritis of both knees 06/25/2015   Degeneration of intervertebral disc of lumbar region 01/04/2015   Spondylolisthesis at L5-S1 level 01/04/2015   Fracture of lateral malleolus 11/14/2014   Foot, fracture, navicular 11/14/2014    PCP: Antonio Cyndee Jamee JONELLE, DO   REFERRING PROVIDER: Vernetta Lonni GRADE, MD  REFERRING DIAG: [phone] (ICD-10-CM) - Status post total replacement of left hip   THERAPY DIAG:  Muscle weakness (generalized)  Pain in left hip  RATIONALE FOR EVALUATION AND TREATMENT: Rehabilitation  ONSET DATE: 02/26/2024 - L THA (anterior approach)  NEXT MD VISIT: 10/06/2024   SUBJECTIVE:  SUBJECTIVE STATEMENT: Pt reports she is doing well.  Pt requests an updated aquatic HEP; already going to pool 3x/wk.  Overall, pleased with level of function and feels comfortable d/c after today's visit.    EVAL: Patient is ~2.5 weeks status post L THA via anterior approach on 02/26/2024.  She initially had 2 weeks of HH PT which was completed on 03/11/24.  She continues to experience what she feels is bone pain/discomfort in the L hip but nothing like than pain she had before surgery.  Also notes numbness along the surgical incision.  Still using the 4WW full time due to fear of falling - she states she  has been using the 4WW or cane for the past 9 months due to limited standing/walking tolerance and fear of falling.  She would like to eventually try to wean from all ADs.  Sleeping has been difficult as she normally sleeps on her side and cannot comfortably right now - sleeping on her back makes her back hurt.  She thinks she may have a leg length discrepancy following the THA surgery.  PAIN: Are you having pain?  Yes: NPRS scale: 1/10   Pain location: L hip Description: discomfort Aggravating:  sitting too long Relieving: getting up every 30 min  PERTINENT HISTORY:  Anxiety, OA, asthma, depression, DOE, GERD, HLD, IBS, LBP, obesity, SVT and vertigo.   PRECAUTIONS: Anterior hip  RED FLAGS: None  WEIGHT BEARING RESTRICTIONS: No  FALLS:  Has patient fallen in last 6 months? Yes. Number of falls 1   LIVING ENVIRONMENT: Lives with: lives with their family Lives in: House Stairs: Yes: Internal: to basement - 12 steps; on right going up, on left going up, and can reach both and External: 2+1 steps; on left going up Has following equipment at home: Single point cane, Walker - 2 wheeled, Environmental Consultant - 4 wheeled, and shower chair  OCCUPATION: Works from home  PLOF: Independent and Leisure: gardening (not this year but wants to get back to that next year), [employer] water exercises   PATIENT GOALS: To walk w/o a walker or cane with a steady gait.   OBJECTIVE: (objective measures completed at initial evaluation unless otherwise dated)  DIAGNOSTIC FINDINGS:  02/26/24 - DG Pelvis IMPRESSION: 1. Left hip arthroplasty without immediate postoperative complication.  PATIENT SURVEYS:  ABC scale: The Activities-Specific Balance Confidence (ABC) Scale 0% 10 20 30  40 50 60 70 80 90 100% No confidence<->completely confident  "How confident are you that you will not lose your balance or become unsteady when you . . .  Date tested: 03/16/2024  04/06/2024   Walk around the house 70%   2. Walk up or  down stairs 50%   3. Bend over and pick up a slipper from in front of a closet floor 30%   4. Reach for a small can off a shelf at eye level 50%   5. Stand on tip toes and reach for something above your head 30%   6. Stand on a chair and reach for something 0%   7. Sweep the floor 40%   8. Walk outside the house to a car parked in the driveway 40%   9. Get into or out of a car 40%   10. Walk across a parking lot to the mall 30%   11. Walk up or down a ramp 60%   12. Walk in a crowded mall where people rapidly walk past you 30%   13. Are bumped into by people  as you walk through the mall 30%   14. Step onto or off of an escalator while you are holding onto the railing 20%   15. Step onto or off an escalator while holding onto parcels such that you cannot hold onto the railing 0%   16. Walk outside on icy sidewalks 0%   Total: #/16 520 / 1600 = 32.5 % 1260 / 1600 = 78.8 %  Level of physical functioning:  Low Moderate    LEFS  Extreme difficulty/unable (0), Quite a bit of difficulty (1), Moderate difficulty (2), Little difficulty (3), No difficulty (4) Survey date:  03/16/2024  04/06/2024   Any of your usual work, housework or school activities 2 3  2. Usual hobbies, recreational or sporting activities 3 3  3. Getting into/out of the bath 1 4  4. Walking between rooms 4 4  5. Putting on socks/shoes 2 4  6. Squatting  0 3  7. Lifting an object, like a bag of groceries from the floor 0 4  8. Performing light activities around your home 2 4  9. Performing heavy activities around your home 0 3  10. Getting into/out of a car 2 4  11. Walking 2 blocks 0 4  12. Walking 1 mile 0 1  13. Going up/down 10 stairs (1 flight) 2 4  14. Standing for 1 hour 0 0  15.  sitting for 1 hour 2 0  16. Running on even ground 0 0  17. Running on uneven ground 0 0  18. Making sharp turns while running fast 0 0  19. Hopping  0 0  20. Rolling over in bed 1 3  Score total:   21 / 80 = 26.3 % 48 / 80 = 60.0 %   Functional Limitation:  Moderate Mild to moderate    COGNITION: Overall cognitive status: Within functional limits for tasks assessed    SENSATION: WFL Except numbness along surgical incision  POSTURE:  Apparent ~1 cm leg length discrepancy (L longer than R) with L iliac crest sitting higher  MUSCLE LENGTH: (TBA as indicated) Hamstrings:  ITB:  Piriformis:  Hip flexors:  Quads:  Heelcord:   LOWER EXTREMITY ROM:  Active ROM Right eval Left eval  Hip flexion    Hip extension    Hip abduction    Hip adduction    Hip internal rotation    Hip external rotation    Knee flexion    Knee extension    Ankle dorsiflexion    Ankle plantarflexion    Ankle inversion    Ankle eversion    (Blank rows = not tested)  LOWER EXTREMITY MMT: (all testing in sitting on eval)  MMT Right eval Left eval R 04/06/24 L 04/06/24  Hip flexion 4+ 3- 4+ 4  Hip extension 4- 3+ 4- 4-  Hip abduction 4 3+ 4 4-  Hip adduction 4 3+ 4 4  Hip internal rotation 4 2+ 4+ 4-  Hip external rotation 4 3+ 4+ 4-  Knee flexion 5 5 5 5   Knee extension 5 4 5 5   Ankle dorsiflexion 4- 4- 4+ 4+  Ankle plantarflexion      Ankle inversion      Ankle eversion       (Blank rows = not tested)  FUNCTIONAL TESTS:  5 times sit to stand: 18.97 sec Timed up and go (TUG): 17.69 with 4WW; 15.66 sec w/o AD 10 meter walk test: 13.60 sec with 4WW; 16.00 sec w/o AD  Berg Balance Scale: 03/18/24 - 47/56, indicating moderate (>50%) risk for falls FGA: 03/18/24 - 14/30, indicating high risk for falls  GAIT: Distance walked: clinic distances Assistive device utilized: Environmental Consultant - 4 wheeled and None Level of assistance: Modified independence and SBA Gait pattern: step through pattern, lateral hip instability with L lateral lean to compensate for R hip drop/Trendelenburg     TODAY'S TREATMENT:  TODAY'S TREATMENT:  05/10/24 Pt seen for aquatic therapy today.  Treatment took place in water 3.25-4.5 ft in depth at the [facility] pool. Temp of water was 92.  Pt entered/exited the pool via stairs independently with bilat rail.     * warm up of walking forward/backward/ side stepping * at wall: hip circles CW/CCW x 10 each * UE on hand floats: 3 way LE kick x 10 each; forward walking lunges; marching forward/ backward with reciprocal row motion; leg swings into hip flexion/extension; Leg swings into hip abdct/ add crossing midline * side step with arm abdct/ add with yellow hand floats -> side step into wide squat with arm add with floats * suitcase carry forward/backward with single / bil yellow hand floats * tandem gait forward/ backwards hands on top of water. * STS on 3rd step x 5 * runners step ups x 5 each LE with light UE support * cycling in deeper water * fig 4 stretch at rails     04/18/24 THERAPEUTIC EXERCISE: To improve strength, endurance, and ROM.  Demonstration, verbal and tactile cues throughout for technique.  Nustep L4x19min Seated hip flexion RTB x 10 B Seated clams RTB x 10 B Hooklying L unilateral bent knee fallout (hip ABD/ER) 2 x 10 Supine R hip abduction slide across table 2x10 Bridge peanut ball x 10 DKTC with peanut ball 2 x 10  MANUAL THERAPY: To promote normalized muscle tension, improve joint mobility and/or for pain modulation  IASTM with lacrosse ball to L lateral quads, ITB  hooklying  04/11/2024  THERAPEUTIC EXERCISE: To improve strength, endurance, and ROM.  Demonstration, verbal and tactile cues throughout for technique.  Rec Bike - L2 x 6' S/L L reverse clam x 5, 2# x 10 S/L L clam attempted but deferred d/t increased L hip pain Hooklying L unilateral bent knee fallout (hip ABD/ER) 2 x 10 Seated B RTB clam x 10 Seated L hip flexion & ER x 10 Standing L hip ER isometric into wall 10 x 3   04/06/2024  THERAPEUTIC EXERCISE: To improve strength, endurance, and ROM.    NuStep - L5 x 6'  THERAPEUTIC ACTIVITIES: To improve functional performance.   Demonstration, verbal and tactile cues throughout for technique. LE MMT - see above MMT table LEFS: 48 / 80 = 60.0 % ABC scale: 1260 / 1600 = 78.8 % TRX lateral lunge x 10 bil  PHYSICAL PERFORMANCE TEST or MEASUREMENT: Functional Gait  Assessment  Gait Level Surface Walks 20 ft in less than 7 sec but greater than 5.5 sec, uses assistive device, slower speed, mild gait deviations, or deviates 6-10 in outside of the 12 in walkway width.   Change in Gait Speed Able to smoothly change walking speed without loss of balance or gait deviation. Deviate no more than 6 in outside of the 12 in walkway width.   Gait with Horizontal Head Turns Performs head turns smoothly with no change in gait. Deviates no more than 6 in outside 12 in walkway width   Gait with Vertical Head Turns Performs head turns with no change  in gait. Deviates no more than 6 in outside 12 in walkway width.   Gait and Pivot Turn Pivot turns safely within 3 sec and stops quickly with no loss of balance.   Step Over Obstacle Is able to step over one shoe box (4.5 in total height) without changing gait speed. No evidence of imbalance.   Gait with Narrow Base of Support Ambulates 4-7 steps.   Gait with Eyes Closed Walks 20 ft, slow speed, abnormal gait pattern, evidence for imbalance, deviates 10-15 in outside 12 in walkway width. Requires more than 9 sec to ambulate 20 ft.   Ambulating Backwards Walks 20 ft, uses assistive device, slower speed, mild gait deviations, deviates 6-10 in outside 12 in walkway width.   Steps Alternating feet, must use rail.   Total Score 22   FGA comment: 19-24 = medium risk fall       SELF CARE:  Provided education on post-op incisional scar tissue mobilization as well as retrograde massage to help with restoration of normal sensation and reduced edema.   04/04/2024  THERAPEUTIC EXERCISE: To improve strength and endurance.    Rec Bike - L2 x 7'  THERAPEUTIC ACTIVITIES: To improve functional  performance.  Demonstration, verbal and tactile cues throughout for technique. 5xSTS = 12.53 sec w/o UE assist TUG = 9.62 sec w/o AD TRX squat 2 x 10  NEUROMUSCULAR RE-EDUCATION: To improve balance, coordination, kinesthesia, proprioception, and reduce fall risk. Fitter hip ABD (1 black, 1 blue) 2 x 10 bil Fitter hip extension (1 black, 1 blue) 2 x 10 bil, intermittent UE support on back of chair L/R SLS + 5-way step/toe tap to colored dots x 1 cycle,  L/R SLS + 6-way (fwd cross-over) step/toe tap to colored dots x 2 cycles L/R SLS + 5-way slides to colored dots x 5 cycles   03/31/2024  THERAPEUTIC EXERCISE: To improve strength and endurance.  Demonstration, verbal and tactile cues throughout for technique.  Rec Bike - L2 x 7' SLS on 2 step + opp LE hip hike x 5 bil  NEUROMUSCULAR RE-EDUCATION: To improve balance, coordination, kinesthesia, posture, proprioception, and reduce fall risk. L SLS + 5-way step/toe tap to colored dots x 5 cycles L SLS + 5-way slides to colored dots x 5 cycles L/R SLS + opp LE RTB 4-way SLR x 10, intermittent UE support on back of chair B side-stepping on balance beam x 4 passes - occasional   03/28/24 THERAPEUTIC EXERCISE: To improve endurance and ROM.  Demonstration, verbal and tactile cues throughout for technique. NuStep L4 x 6'  Foam stand:  EO x 1'   EC x 1'  toe/heel raises x 20  squats x 2/10   turning 360 deg x 2 each direction lateral step ups to foam x 10 LLE Medium swiss ball bouncing EO x 1'; EC x 1';  alternating knee ext x 10 BLE SLS at counter on LLE Seated ankle inversion and eversion w/ YTB x 2/10 BLE   03/18/2024 PHYSICAL PERFORMANCE TEST or MEASUREMENT:  Berg Balance Test   Sit to Stand Able to stand without using hands and stabilize independently    Standing Unsupported Able to stand safely 2 minutes    Sitting with Back Unsupported but Feet Supported on Floor or Stool Able to sit safely and securely 2 minutes    Stand to  Sit Sits safely with minimal use of hands    Transfers Able to transfer safely, minor use of hands    Standing  Unsupported with Eyes Closed Able to stand 10 seconds safely    Standing Unsupported with Feet Together Able to place feet together independently and stand 1 minute safely    From Standing, Reach Forward with Outstretched Arm Can reach forward >12 cm safely (5)    From Standing Position, Pick up Object from Floor Able to pick up shoe, needs supervision    From Standing Position, Turn to Look Behind Over each Shoulder Looks behind from both sides and weight shifts well    Turn 360 Degrees Able to turn 360 degrees safely but slowly    Standing Unsupported, Alternately Place Feet on Step/Stool Able to stand independently and complete 8 steps >20 seconds    Standing Unsupported, One Foot in Front Able to place foot ahead of the other independently and hold 30 seconds    Standing on One Leg Tries to lift leg/unable to hold 3 seconds but remains standing independently    Total Score 47    Berg comment: 46-51 = Moderate (>50%) fall risk      Functional Gait  Assessment   Gait Level Surface Walks 20 ft, slow speed, abnormal gait pattern, evidence for imbalance or deviates 10-15 in outside of the 12 in walkway width. Requires more than 7 sec to ambulate 20 ft.    Change in Gait Speed Able to change speed, demonstrates mild gait deviations, deviates 6-10 in outside of the 12 in walkway width, or no gait deviations, unable to achieve a major change in velocity, or uses a change in velocity, or uses an assistive device.    Gait with Horizontal Head Turns Performs head turns smoothly with slight change in gait velocity (eg, minor disruption to smooth gait path), deviates 6-10 in outside 12 in walkway width, or uses an assistive device.    Gait with Vertical Head Turns Performs head turns with no change in gait. Deviates no more than 6 in outside 12 in walkway width.    Gait and Pivot Turn Pivot turns  safely in greater than 3 sec and stops with no loss of balance, or pivot turns safely within 3 sec and stops with mild imbalance, requires small steps to catch balance.    Step Over Obstacle Is able to step over one shoe box (4.5 in total height) but must slow down and adjust steps to clear box safely. May require verbal cueing.    Gait with Narrow Base of Support Ambulates less than 4 steps heel to toe or cannot perform without assistance.    Gait with Eyes Closed Walks 20 ft, slow speed, abnormal gait pattern, evidence for imbalance, deviates 10-15 in outside 12 in walkway width. Requires more than 9 sec to ambulate 20 ft.    Ambulating Backwards Walks 20 ft, slow speed, abnormal gait pattern, evidence for imbalance, deviates 10-15 in outside 12 in walkway width.    Steps Two feet to a stair, must use rail.    Total Score 14    FGA comment: < 19 = high risk fall      THERAPEUTIC EXERCISE: To improve strength, endurance, ROM, and flexibility.  Demonstration, verbal and tactile cues throughout for technique.  NuStep - L3 x 6 min (UE/LE) Standing hip ABD x 10 bil, UE support on back of chair for balance  Standing hip extension x 10 bil, UE support on back of chair for balance  Standing hip flexion march 2# x 10 bil, UE support on back of chair for balance  Standing heel &  toe raises x 20   03/16/2024  SELF CARE:  Reviewed eval findings and role of PT in addressing identified deficits as well as review of HH PT HEP:  Supine heel slide Supine hip ABD/ADD Hookying SLR Hooklying SAQ Standing hip ABD Standing hip extension Standing heel raises - instructed pt to add alternating toe raises   PATIENT EDUCATION:  Education details: standardized testing results and interpretation, progress with PT, and continue with current HEP  Person educated: Patient Education method: Explanation Education comprehension: verbalized understanding  HOME EXERCISE PROGRAM: Access Code: [email or web address] URL:  [email or web address] Date: 04/11/2024 Prepared by: Elijah Hidden  Exercises - Standing Hip Flexion with Anchored Resistance and Chair Support  - 1 x daily - 3 x weekly - 2 sets - 10 reps - 3 sec hold - Standing Hip Adduction with Anchored Resistance  - 1 x daily - 3 x weekly - 2 sets - 10 reps - 3 sec hold - Standing Hip Extension with Anchored Resistance  - 1 x daily - 3 x weekly - 2 sets - 10 reps - 3 sec hold - Standing Hip Abduction with Anchored Resistance  - 1 x daily - 3 x weekly - 2 sets - 10 reps - 3 sec hold - Hip Hiking on Step  - 1 x daily - 3 x weekly - 2 sets - 10 reps - 3 sec hold - Sidelying Reverse Clamshell  - 1 x daily - 3 x weekly - 2 sets - 10 reps - 3 sec hold - Hooklying Isometric Clamshell (Mirrored)  - 1 x daily - 3 x weekly - 2 sets - 10 reps - 3 sec hold - Seated Hip Abduction/External Rotation With Resistance at Thighs  - 1 x daily - 3 x weekly - 2 sets - 10 reps - 3 sec hold - Standing Hip External Rotation at Wall  - 1 x daily - 3 x weekly - 2 sets - 10 reps - 3 sec hold  HH PT HEP + 2# ankle cuff weights Supine heel slide Supine hip ABD/ADD Hookying SLR Hooklying SAQ Standing hip ABD Standing hip extension Standing heel raises - instructed pt to add alternating toe raises  Aquatic Access Code: [email or web address]  (updated from previous episode) URL: [email or web address] This aquatic home exercise program from MedBridge utilizes pictures from land based exercises, but has been adapted prior to lamination and issuance.     ASSESSMENT:  CLINICAL IMPRESSION: Pt tolerated all exercises in water well, without production of symptoms.  HEP updated to include balance exercises in water.  Pt verbalized readiness to d/c to HEP today.  She has partially met her goals and is pleased with current level of function.    EVAL: Tina Hinton is a 68 y.o. female who was referred to physical therapy for evaluation and treatment s/p L hip THA via  anterior approach on 02/26/2024.  Patient reports continued mild L hip pain since surgery, but not as intense as before surgery.  Pain is worse when she is working on her [facility] PT exercises.  Patient has deficits in L hip ROM, B LE flexibility, BLE strength, abnormal posture with potential leg length discrepancy, and altered gait pattern which are interfering with ADLs and are impacting quality of life.  On LEFS patient scored 21/80 demonstrating moderate functional limitation.  She expresses a significant concern regarding fear of falling, with ABC scale revealing only 32.5% balance confidence, indicative of a low level of physical functioning, therefore we will plan for further standardized  balance testing next visit.  Tina Hinton will benefit from skilled PT to address above deficits to improve mobility and activity tolerance with decreased pain interference and decreased risk for falls.  OBJECTIVE IMPAIRMENTS: Abnormal gait, decreased activity tolerance, decreased balance, decreased coordination, decreased knowledge of condition, decreased knowledge of use of DME, decreased mobility, difficulty walking, decreased ROM, decreased strength, increased fascial restrictions, impaired perceived functional ability, increased muscle spasms, impaired flexibility, impaired sensation, improper body mechanics, postural dysfunction, and pain.   ACTIVITY LIMITATIONS: carrying, lifting, bending, sitting, standing, squatting, sleeping, stairs, transfers, bed mobility, bathing, dressing, and locomotion level  PARTICIPATION LIMITATIONS: meal prep, cleaning, laundry, driving, shopping, community activity, occupation, and yard work  PERSONAL FACTORS: Fitness, Past/current experiences, Profession, Time since onset of injury/illness/exacerbation, and 3+ comorbidities: Anxiety, OA, asthma, depression, DOE, GERD, HLD, IBS, LBP, obesity, SVT and vertigo are also affecting patient's functional outcome.   REHAB POTENTIAL:  Good  CLINICAL DECISION MAKING: Evolving/moderate complexity  EVALUATION COMPLEXITY: Moderate   GOALS: Goals reviewed with patient? Yes  SHORT TERM GOALS: Target date: 04/13/2024  Patient will be independent with initial HEP. Baseline: Reviewed HH PT HEP on eval 03/18/24 - guidance provided on how to add resistance with current HH PT HEP using 2# ankle cuff weights Goal status: MET - 04/04/24  2.  Patient will report at least 25% improvement in L hip pain to improve QOL. Baseline: 1-2/10 Goal status: MET  3.  Complete standardized balance testing and update LTG's as indicated Baseline: Berg and FGA vs DGI pending Goal status: MET - 03/18/24  4.  Patient will improve 5xSTS time to </= 15 seconds to demonstrate improved functional strength and transfer efficiency  Baseline: 18.97 sec Goal status: MET - 04/04/24 - 12.53 sec w/o UE assist  5.  Patient will demonstrate decreased TUG time to </= 13.5 sec to decrease risk for falls with transitional mobility Baseline: 17.69 with 4WW; 15.66 sec w/o AD Goal status: MET - 04/04/24 - 9.62 sec w/o AD  LONG TERM GOALS: Target date: 05/11/2024  Patient will be independent with advanced/ongoing HEP to improve outcomes and carryover.  Baseline:  04/06/24 - met for current HEP Goal status: MET - 05/10/24  2.  Patient will report at least 50-75% improvement in L hip pain to improve QOL. Baseline: 1-2/10 Goal status: MET - 04/06/24 - Pt reports 100% improvement in pain, still has a little discomfort at the surgical incision  3.  Patient will demonstrate improved B LE strength to >/= 4 to 4+/5 for improved stability and ease of mobility. Baseline: Refer to above LE MMT table Goal status: PARTIALLY MET - 04/06/24 - overall improvement in B LE  4.  Patient will be able to ambulate 600' with or w/o LRAD and normal gait pattern without increased pain to access community.  Baseline: Currently reliant on 4WW for almost all ambulation - limited  distances 03/18/24 - Pt using SPC for ambulation today Goal status: PARTIALLY MET - 04/11/24 - Pt ambulating w/o AD most of the time now with decreasing gait deviations although still slight lateral sway  5.  Patient will report >/= 40/80 on LEFS (MCID = 9 pts) to demonstrate improved functional ability. Baseline:  21 / 80 = 26.3 % Goal status: MET - 04/06/24 - 48 / 80 = 60.0 %  6.  Patient will report >/= 50% average balance confidence on ABC to demonstrate at least moderate level of physical functioning and decreased risk for falls. Baseline: 520 / 1600 = 32.5 %,  low level of physical functioning Goal status: MET - 04/06/24 - 1260 / 1600 = 78.8 %  7.  Patient will demonstrate at least 19/24 on DGI or 19/30 on FGA to decrease risk of falls. Baseline: 03/18/24 - FGA = 14/30 Goal status: MET - 04/06/24 - FGA = 22/30     PLAN:  PT FREQUENCY: 2x/week  PT DURATION: 8 weeks  PLANNED INTERVENTIONS: 97164- PT Re-evaluation, 97750- Physical Performance Testing, 97110-Therapeutic exercises, 97530- Therapeutic activity, 97112- Neuromuscular re-education, 97535- Self Care, 02859- Manual therapy, [phone]- Gait training, [phone]- Aquatic Therapy, [phone]- Electrical stimulation (unattended), [id number]- Ultrasound, [id number]- Ionotophoresis 4mg /ml Dexamethasone, 79439 (1-2 muscles), 20561 (3+ muscles)- Dry Needling, Patient/Family education, Balance training, Stair training, Taping, Joint mobilization, Scar mobilization, DME instructions, Cryotherapy, and Moist heat    Delon Aquas, PTA 05/10/24 9:46 AM [facility] [address] [place], [place], [address] Phone: [phone]   Fax:  [phone]   PHYSICAL THERAPY DISCHARGE SUMMARY  Visits from Start of Care: 9  Current functional level related to goals / functional outcomes: Refer to above clinical impression and goal assessment.    Remaining deficits: Mild B LE weakness as of last  assessment on 04/06/24.  Unable to formally assess status at discharge due to failure to return to land-based PT.    Education / Equipment: Land-based and aquatic HEP   Patient agrees to discharge. Patient goals were partially met. Patient is being discharged due to being pleased with the current functional level.  Elijah EMERSON Hidden, PT 05/10/2024, 12:06 PM  [facility] [address]  [address] [place], [place], [address] Phone: [phone]   Fax:  [phone]

## 2024-05-12 ENCOUNTER — Ambulatory Visit (HOSPITAL_BASED_OUTPATIENT_CLINIC_OR_DEPARTMENT_OTHER): Admitting: Physical Therapy

## 2024-05-17 ENCOUNTER — Ambulatory Visit (HOSPITAL_BASED_OUTPATIENT_CLINIC_OR_DEPARTMENT_OTHER): Admitting: Physical Therapy

## 2024-05-19 ENCOUNTER — Ambulatory Visit (HOSPITAL_BASED_OUTPATIENT_CLINIC_OR_DEPARTMENT_OTHER): Admitting: Physical Therapy

## 2024-05-24 ENCOUNTER — Ambulatory Visit (HOSPITAL_BASED_OUTPATIENT_CLINIC_OR_DEPARTMENT_OTHER): Admitting: Physical Therapy

## 2024-05-26 ENCOUNTER — Ambulatory Visit (HOSPITAL_BASED_OUTPATIENT_CLINIC_OR_DEPARTMENT_OTHER): Admitting: Physical Therapy

## 2024-05-31 ENCOUNTER — Ambulatory Visit (HOSPITAL_BASED_OUTPATIENT_CLINIC_OR_DEPARTMENT_OTHER): Admitting: Physical Therapy

## 2024-06-07 ENCOUNTER — Ambulatory Visit (HOSPITAL_BASED_OUTPATIENT_CLINIC_OR_DEPARTMENT_OTHER): Admitting: Physical Therapy

## 2024-06-10 ENCOUNTER — Ambulatory Visit: Admitting: Physical Therapy

## 2024-07-06 ENCOUNTER — Other Ambulatory Visit: Payer: Self-pay

## 2024-07-06 DIAGNOSIS — N951 Menopausal and female climacteric states: Secondary | ICD-10-CM

## 2024-07-06 MED ORDER — PAROXETINE HCL 10 MG PO TABS: 10.0000 mg | ORAL_TABLET | Freq: Every day | ORAL | 1 refills | Status: AC

## 2024-08-08 ENCOUNTER — Ambulatory Visit: Admitting: Obstetrics and Gynecology

## 2024-10-06 ENCOUNTER — Ambulatory Visit: Admitting: Orthopaedic Surgery
